# Patient Record
Sex: Male | Born: 1950 | Race: White | Hispanic: No | Marital: Married | Smoking: Former smoker
Health system: Southern US, Community
[De-identification: ages and names within clinical notes are randomized; demographics above are authoritative.]

## PROBLEM LIST (undated history)

## (undated) DIAGNOSIS — R52 Pain, unspecified: Secondary | ICD-10-CM

## (undated) DIAGNOSIS — H02402 Unspecified ptosis of left eyelid: Secondary | ICD-10-CM

## (undated) DIAGNOSIS — G629 Polyneuropathy, unspecified: Secondary | ICD-10-CM

## (undated) DIAGNOSIS — E785 Hyperlipidemia, unspecified: Secondary | ICD-10-CM

## (undated) DIAGNOSIS — R42 Dizziness and giddiness: Secondary | ICD-10-CM

## (undated) DIAGNOSIS — H9192 Unspecified hearing loss, left ear: Secondary | ICD-10-CM

## (undated) DIAGNOSIS — Z923 Personal history of irradiation: Secondary | ICD-10-CM

## (undated) DIAGNOSIS — C109 Malignant neoplasm of oropharynx, unspecified: Secondary | ICD-10-CM

## (undated) DIAGNOSIS — Z860100 Personal history of colon polyps, unspecified: Secondary | ICD-10-CM

## (undated) DIAGNOSIS — K1230 Oral mucositis (ulcerative), unspecified: Secondary | ICD-10-CM

## (undated) DIAGNOSIS — G4733 Obstructive sleep apnea (adult) (pediatric): Secondary | ICD-10-CM

## (undated) DIAGNOSIS — Z9221 Personal history of antineoplastic chemotherapy: Secondary | ICD-10-CM

## (undated) DIAGNOSIS — E7800 Pure hypercholesterolemia, unspecified: Secondary | ICD-10-CM

## (undated) DIAGNOSIS — K117 Disturbances of salivary secretion: Secondary | ICD-10-CM

## (undated) DIAGNOSIS — Z9289 Personal history of other medical treatment: Secondary | ICD-10-CM

## (undated) DIAGNOSIS — K219 Gastro-esophageal reflux disease without esophagitis: Secondary | ICD-10-CM

## (undated) DIAGNOSIS — M5126 Other intervertebral disc displacement, lumbar region: Secondary | ICD-10-CM

## (undated) DIAGNOSIS — K4090 Unilateral inguinal hernia, without obstruction or gangrene, not specified as recurrent: Secondary | ICD-10-CM

## (undated) DIAGNOSIS — R682 Dry mouth, unspecified: Secondary | ICD-10-CM

## (undated) DIAGNOSIS — Z931 Gastrostomy status: Secondary | ICD-10-CM

## (undated) DIAGNOSIS — S82409A Unspecified fracture of shaft of unspecified fibula, initial encounter for closed fracture: Secondary | ICD-10-CM

## (undated) DIAGNOSIS — N400 Enlarged prostate without lower urinary tract symptoms: Secondary | ICD-10-CM

## (undated) DIAGNOSIS — G709 Myoneural disorder, unspecified: Secondary | ICD-10-CM

## (undated) HISTORY — DX: Dizziness and giddiness: R42

## (undated) HISTORY — DX: Oral mucositis (ulcerative), unspecified: K12.30

## (undated) HISTORY — DX: Malignant neoplasm of oropharynx, unspecified: C10.9

## (undated) HISTORY — DX: Disturbances of salivary secretion: K11.7

## (undated) HISTORY — DX: Unspecified ptosis of left eyelid: H02.402

## (undated) HISTORY — DX: Gastro-esophageal reflux disease without esophagitis: K21.9

## (undated) HISTORY — DX: Polyneuropathy, unspecified: G62.9

## (undated) HISTORY — DX: Hyperlipidemia, unspecified: E78.5

## (undated) HISTORY — DX: Pain, unspecified: R52

## (undated) HISTORY — DX: Pure hypercholesterolemia, unspecified: E78.00

## (undated) HISTORY — PX: DIAGNOSTIC LAPAROSCOPY: SUR761

## (undated) HISTORY — DX: Benign prostatic hyperplasia without lower urinary tract symptoms: N40.0

## (undated) HISTORY — DX: Unspecified hearing loss, left ear: H91.92

## (undated) HISTORY — DX: Obstructive sleep apnea (adult) (pediatric): G47.33

## (undated) HISTORY — DX: Dry mouth, unspecified: R68.2

## (undated) HISTORY — DX: Unilateral inguinal hernia, without obstruction or gangrene, not specified as recurrent: K40.90

---

## 1988-06-14 HISTORY — PX: HERNIA REPAIR: SHX51

## 2003-06-16 ENCOUNTER — Emergency Department (HOSPITAL_COMMUNITY): Admission: AD | Admit: 2003-06-16 | Discharge: 2003-06-16 | Payer: Self-pay | Admitting: Family Medicine

## 2004-05-05 ENCOUNTER — Encounter: Admission: RE | Admit: 2004-05-05 | Discharge: 2004-05-25 | Payer: Self-pay | Admitting: Family Medicine

## 2004-06-25 ENCOUNTER — Encounter: Admission: RE | Admit: 2004-06-25 | Discharge: 2004-06-25 | Payer: Self-pay | Admitting: Family Medicine

## 2007-12-11 ENCOUNTER — Ambulatory Visit: Payer: Self-pay | Admitting: Internal Medicine

## 2007-12-11 DIAGNOSIS — R079 Chest pain, unspecified: Secondary | ICD-10-CM | POA: Insufficient documentation

## 2007-12-11 DIAGNOSIS — J309 Allergic rhinitis, unspecified: Secondary | ICD-10-CM | POA: Insufficient documentation

## 2007-12-11 DIAGNOSIS — E785 Hyperlipidemia, unspecified: Secondary | ICD-10-CM

## 2007-12-11 DIAGNOSIS — E7800 Pure hypercholesterolemia, unspecified: Secondary | ICD-10-CM | POA: Insufficient documentation

## 2007-12-11 DIAGNOSIS — R0989 Other specified symptoms and signs involving the circulatory and respiratory systems: Secondary | ICD-10-CM

## 2007-12-11 DIAGNOSIS — R0609 Other forms of dyspnea: Secondary | ICD-10-CM | POA: Insufficient documentation

## 2008-06-05 ENCOUNTER — Ambulatory Visit (HOSPITAL_BASED_OUTPATIENT_CLINIC_OR_DEPARTMENT_OTHER): Admission: RE | Admit: 2008-06-05 | Discharge: 2008-06-05 | Payer: Self-pay | Admitting: Orthopedic Surgery

## 2008-06-08 ENCOUNTER — Ambulatory Visit: Payer: Self-pay | Admitting: Internal Medicine

## 2008-06-11 ENCOUNTER — Encounter: Payer: Self-pay | Admitting: Cardiovascular Disease

## 2008-06-24 ENCOUNTER — Encounter: Payer: Self-pay | Admitting: Cardiovascular Disease

## 2010-10-30 NOTE — Procedures (Signed)
NAME:  Steven Robinson, Steven Robinson                ACCOUNT NO.:  [id number]   MEDICAL RECORD NO.:  [id number]          PATIENT TYPE:  OUT   LOCATION:  SLEEP CENTER                 FACILITY:  [facility]   PHYSICIAN:  Clinton D. Maple Hudson, MD, FCCP, FACPDATE OF BIRTH:  1952/05/12   DATE OF STUDY:                            NOCTURNAL POLYSOMNOGRAM   REFERRING PHYSICIAN:  Tammy R. Collins Scotland, M.D.   INDICATION FOR STUDY:  Hypersomnia with sleep apnea.   EPWORTH SLEEPINESS SCORE:  Epworth sleepiness score 1/24.  BMI 35.6.  Weight 270 pounds.  Height 73 inches.  Neck 18 inches.   MEDICATIONS:  Home medications are charted and reviewed.   SLEEP ARCHITECTURE:  Split-study protocol.  During the diagnostic phase,  total sleep time was 159 minutes with sleep efficiency 82%.  Stage I was  5.3%.  Stage II 72.1%.  Stage III absent.  REM 22.6% of total sleep  time.  Sleep latency 8.5 minutes.  REM latency 77.5 minutes.  Wake after  sleep onset, 26 minutes. Arousal index 47 indicating increased EEG  arousal.  Bedtime medication included Ambien, trazodone, aspirin, Xyzal,  and Singulair.   RESPIRATORY DATA:  Split-study protocol.  Apnea-hypopnea index (AHI)  31.2 per hour.  A total of 83 events were scored including 12  obstructive apneas and 71 hypopneas.  All events were recorded as non-  supine.  REM AHI 56.7.  CPAP was titrated to 11 CWP, AHI 0.5 per hour.  He chose a medium ResMed Mirage Quattro full face mask with heated  humidifier.   OXYGEN DATA:  Very loud snoring before CPAP with oxygen desaturation to  a nadir of 85%.  After CPAP control, oxygen saturation mean 94.5% on  room air.   CARDIAC DATA:  Sinus rhythm with frequent PACs.   MOVEMENT-PARASOMNIA:  Occasional limb jerks were noted with a total of  53 counted, of which one was associated with arousal or awakening,  insignificant.  Bathroom x1.   IMPRESSION/RECOMMENDATION:  1. Moderate obstructive sleep apnea/hypopnea syndrome, AHI 31.2 per      hour  with all events listed as non-supine.  Very loud snoring with      oxygen desaturation to a nadir of 85% before CPAP.  2. Successful CPAP titration to 11 CWP, AHI 0.5 per hour.  He chose a      medium ResMed Quattro full face mask      with heated humidifier.  3. Note that the patient avoids supine position because of back      spasms.      Clinton D. Maple Hudson, MD, [facility], FACP  Diplomate, Biomedical engineer of Sleep Medicine  Electronically Signed     CDY/MEDQ  D:  06/09/2008 10:54:50  T:  06/10/2008 01:51:58  Job:  [id number]

## 2011-08-24 ENCOUNTER — Other Ambulatory Visit (HOSPITAL_COMMUNITY)
Admission: RE | Admit: 2011-08-24 | Discharge: 2011-08-24 | Disposition: A | Discharge status: Home / Self Care | Payer: Managed Care, Other (non HMO) | Source: Ambulatory Visit | Attending: Otolaryngology | Admitting: Otolaryngology

## 2011-08-24 DIAGNOSIS — R220 Localized swelling, mass and lump, head: Secondary | ICD-10-CM | POA: Insufficient documentation

## 2011-08-24 DIAGNOSIS — R221 Localized swelling, mass and lump, neck: Secondary | ICD-10-CM | POA: Insufficient documentation

## 2011-08-26 DIAGNOSIS — C109 Malignant neoplasm of oropharynx, unspecified: Secondary | ICD-10-CM

## 2011-08-26 HISTORY — DX: Malignant neoplasm of oropharynx, unspecified: C10.9

## 2011-08-26 HISTORY — PX: FINE NEEDLE ASPIRATION: SHX406

## 2011-08-30 ENCOUNTER — Other Ambulatory Visit: Payer: Self-pay | Admitting: Otolaryngology

## 2011-08-30 DIAGNOSIS — IMO0002 Reserved for concepts with insufficient information to code with codable children: Secondary | ICD-10-CM

## 2011-08-30 DIAGNOSIS — C799 Secondary malignant neoplasm of unspecified site: Secondary | ICD-10-CM

## 2011-08-31 ENCOUNTER — Telehealth: Payer: Self-pay | Admitting: Hematology & Oncology

## 2011-08-31 ENCOUNTER — Encounter: Payer: Self-pay | Admitting: *Deleted

## 2011-08-31 NOTE — Telephone Encounter (Signed)
Per MD Patient cannot wait till 09-08-11 for PET scan. I talked to [place] at [facility] and he said there was no way they could do it until next week. Pet is scheduled at [facility] for 09-03-11 at 8am Patient is aware of appointment and to be NPO 6 hrs and to get a CD of the scan to bring with him here.. Scheduled with Joyce Gross at [facility]. 3-27 at [facility] is canceled order was faxed.

## 2011-09-01 ENCOUNTER — Other Ambulatory Visit: Payer: Self-pay | Admitting: Radiation Oncology

## 2011-09-01 ENCOUNTER — Encounter: Payer: Self-pay | Admitting: *Deleted

## 2011-09-01 ENCOUNTER — Ambulatory Visit
Admission: RE | Admit: 2011-09-01 | Discharge: 2011-09-01 | Disposition: A | Discharge status: Home / Self Care | Payer: Managed Care, Other (non HMO) | Source: Ambulatory Visit | Attending: Radiation Oncology | Admitting: Radiation Oncology

## 2011-09-01 ENCOUNTER — Encounter: Payer: Self-pay | Admitting: Radiation Oncology

## 2011-09-01 DIAGNOSIS — G4733 Obstructive sleep apnea (adult) (pediatric): Secondary | ICD-10-CM | POA: Insufficient documentation

## 2011-09-01 DIAGNOSIS — Z79899 Other long term (current) drug therapy: Secondary | ICD-10-CM | POA: Insufficient documentation

## 2011-09-01 DIAGNOSIS — Z510 Encounter for antineoplastic radiation therapy: Secondary | ICD-10-CM | POA: Insufficient documentation

## 2011-09-01 DIAGNOSIS — Z7982 Long term (current) use of aspirin: Secondary | ICD-10-CM | POA: Insufficient documentation

## 2011-09-01 DIAGNOSIS — C760 Malignant neoplasm of head, face and neck: Secondary | ICD-10-CM | POA: Insufficient documentation

## 2011-09-01 DIAGNOSIS — K9422 Gastrostomy infection: Secondary | ICD-10-CM | POA: Insufficient documentation

## 2011-09-01 DIAGNOSIS — J029 Acute pharyngitis, unspecified: Secondary | ICD-10-CM | POA: Insufficient documentation

## 2011-09-01 DIAGNOSIS — Z809 Family history of malignant neoplasm, unspecified: Secondary | ICD-10-CM | POA: Insufficient documentation

## 2011-09-01 DIAGNOSIS — B370 Candidal stomatitis: Secondary | ICD-10-CM | POA: Insufficient documentation

## 2011-09-01 DIAGNOSIS — Y833 Surgical operation with formation of external stoma as the cause of abnormal reaction of the patient, or of later complication, without mention of misadventure at the time of the procedure: Secondary | ICD-10-CM | POA: Insufficient documentation

## 2011-09-01 DIAGNOSIS — C770 Secondary and unspecified malignant neoplasm of lymph nodes of head, face and neck: Secondary | ICD-10-CM | POA: Insufficient documentation

## 2011-09-01 DIAGNOSIS — C801 Malignant (primary) neoplasm, unspecified: Secondary | ICD-10-CM | POA: Insufficient documentation

## 2011-09-01 DIAGNOSIS — L988 Other specified disorders of the skin and subcutaneous tissue: Secondary | ICD-10-CM | POA: Insufficient documentation

## 2011-09-01 DIAGNOSIS — K121 Other forms of stomatitis: Secondary | ICD-10-CM | POA: Insufficient documentation

## 2011-09-01 DIAGNOSIS — K219 Gastro-esophageal reflux disease without esophagitis: Secondary | ICD-10-CM | POA: Insufficient documentation

## 2011-09-01 HISTORY — DX: Personal history of colonic polyps: Z86.010

## 2011-09-01 HISTORY — DX: Other intervertebral disc displacement, lumbar region: M51.26

## 2011-09-01 HISTORY — DX: Personal history of colon polyps, unspecified: Z86.0100

## 2011-09-01 NOTE — Progress Notes (Signed)
Encounter addended by: Delynn Flavin, RN on: 09/01/2011  6:01 PM<BR>     Documentation filed: Inpatient Patient Education

## 2011-09-01 NOTE — Progress Notes (Signed)
[facility] Radiation Oncology NEW PATIENT EVALUATION  Name: Steven Robinson MRN: [id number]  Date: 09/01/2011  DOB: [birth year]  Status: outpatient   CC: No primary provider on file.  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 male who was in his usual state of health until he developed some cold-like symptoms and a subsequent mass in his left neck. The mass was noticeable approximately 3 weeks ago. He estimates that he noticed it on 08/14/2011. Mass has gotten progressively bigger. He has also been more tired than usual. He acknowledges a dry cough and some postnasal drip. He believes his voice is a little more raspy than usual but may attribute this to a cold and allergies. He saw his primary doctor to referred him to otolaryngology. The patients underwent a CT scan of his neck on 08/20/2011. This showed enlarged left mid jugular chain lymph nodes. I have the report from that CT scan but not the actual images. These lymph nodes were grouped together in a large mass. The nodal mass was biopsied on 08/24/2011, showing metastatic squamous cell carcinoma. HPV P16 immunohistochemical stain is equivocal..  He has a PET scan scheduled for Friday, March 22. This is going to be performed at [facility]. The patient was referred to medical oncology in [place] [facility] but he wishes to see a medical oncologist in [place] as this is actually closer to his home.  In terms of other symptoms he had knowledge is a little bit of soreness in his left collarbone as well as some dizziness for the past 3 months and occasional right-sided headaches in the evenings. He denies any dysphagia odynophagia or significant smoking history. He had his eyes checked by the eye doctor recently and his vision is decreased from his previous appointment. However he  denies any focal neurologic deficits. His left ear has been popping during recent airplane trips.  He has no  history of skin cancer.  PREVIOUS RADIATION THERAPY: No   PAST MEDICAL HISTORY:  has a past medical history of Hyperlipoproteinemia; Obstructive sleep apnea; Mass in neck; Cancer (08/26/2011); GERD (gastroesophageal reflux disease); colonic polyps; and Herniated lumbar intervertebral disc.     PAST SURGICAL HISTORY:  Past Surgical History  Procedure Date  . Hernia repair     Left  . Fine needle aspiration 08/26/11     LEFT NECK FNA, LYMPH NODE, METASTATIC SQUAMOUS CELL CARCINOMA     FAMILY HISTORY: family history includes Alzheimer's disease in his mother; Cancer in his paternal grandfather; Cancer (age of onset:75) in his father; Hypertension in his father; Stroke in his paternal grandmother; and Tics in his mother.   SOCIAL HISTORY:  reports that he has never smoked. He does not have any smokeless tobacco history on file. He reports that he does not drink alcohol or use illicit drugs. He acknowledges that socially he smoked tobacco in college but none in the past 30-35 years.  He used to drink 3-4 glasses of wine a day but he quit drinking 3 years ago.   ALLERGIES: Cyclobenzaprine hcl   MEDICATIONS:  Current Outpatient Prescriptions  Medication Sig Dispense Refill  . aspirin 81 MG tablet Take 81 mg by mouth daily.      . diphenhydrAMINE (BENADRYL) 25 MG tablet Take 25 mg by mouth Nightly.       [name]  omega-3 acid ethyl esters (LOVAZA) 1 G capsule Take 4 g by mouth Nightly.       . rosuvastatin (CRESTOR) 10 MG tablet Take 40 mg by mouth daily.       . Tamsulosin HCl (FLOMAX) 0.4 MG CAPS Take by mouth.      . zolpidem (AMBIEN) 10 MG tablet Take 10 mg by mouth at bedtime as needed.         REVIEW OF SYSTEMS:  A comprehensive review of systems was negative. other than that described above    PHYSICAL EXAM:  weight is 280 lb 6.4 oz (127.189 kg). His temperature is 97 F  (36.1 C). His blood pressure is 127/84 and his pulse is 81.   General: Alert and oriented, in no acute distress, well-appearing HEENT: Head is normocephalic. Teeth are in decent condition. Pupils are equally round and reactive to light. Extraocular movements are intact. Oropharynx is clear to visual inspection and palpation. Neck: Neck is supple, and notable for a palpable lymph node mass between levels 2 and 3 of the left neck. It is approximately 3.5 centimeters in dimension. The rest of the cervical neck is negative as well as the supraclavicular regions bilaterally. Heart: Regular in rate and rhythm with no murmurs, rubs, or gallops. Chest: Clear to auscultation bilaterally, with no rhonchi, wheezes, or rales. Abdomen: Soft, nontender, nondistended, with no rigidity or guarding. Extremities: No cyanosis or edema. Lymphatics: No concerning lymphadenopathy. Skin: No concerning lesions. Musculoskeletal: symmetric strength and muscle tone throughout. Neurologic: Cranial nerves II through XII are grossly intact. No obvious focalities. Speech is fluent. Coordination is intact. Psychiatric: Judgment and insight are intact. Affect is appropriate.     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 I recommend the following  #1 I concur with the PET scan has been ordered for this Friday. This may help elucidate his primary   #2 If the PET scan is negative, I understand that the patient would be seen back in otolaryngology for endoscopy with  biopsies of the patient's mucosal axis   #3 As the patient would like to see a medical  oncologist in [place] I will refer him to Dr. Gaylyn Rong. I am not sure if Dr. Gaylyn Rong will recommend chemotherapy, but if he does, the patient will potentially need referral for PEG tube  #4 I am referring the patient to dentistry at the cancer center for a preradiation evaluation.   #5 After his workup is complete I would like to start planning his radiotherapy without delay. I told the patient he should expect about 7 weeks of radiotherapy. I spoke with the patient and his wife in great depth about the risks benefits and side effects of radiotherapy to the head and neck region. I will likely treat the nasopharynx and oropharynx and the bilateral neck if he continues to have squamous cell cancer of unknown primary.  #6 in anticipation of dysphasia in the future I will refer the patient to speech and swallow therapy.  It was a pleasure meeting the patient today. We discussed the risks, benefits, and side effects of radiotherapy. No  guarantees of treatment were given. A consent form was signed and placed in the patient's medical record. The patient is enthusiastic about proceeding with treatment. I look forward to participating in the patient's care.  I spent 60 minutes minutes face to face with the patient and more than 50% of that time was spent in counseling and/or coordination of care.

## 2011-09-01 NOTE — Progress Notes (Signed)
Please see the Nurse Progress Note in the MD Initial Consult Encounter for this patient. 

## 2011-09-02 ENCOUNTER — Telehealth: Payer: Self-pay | Admitting: *Deleted

## 2011-09-02 ENCOUNTER — Telehealth: Payer: Self-pay | Admitting: Hematology & Oncology

## 2011-09-02 ENCOUNTER — Encounter: Payer: Self-pay | Admitting: Oncology

## 2011-09-02 NOTE — Telephone Encounter (Signed)
Received new pt chart to schedule appt w/[facility]. Pt already scheduled w/PE in [facility] for 3/25. Chart returned to [facility].

## 2011-09-02 NOTE — Telephone Encounter (Signed)
Patient is calling [facility] to confirm 3-22 PET and he is aware of 09-06-11 MD appointment

## 2011-09-02 NOTE — Telephone Encounter (Signed)
xxxx 

## 2011-09-03 ENCOUNTER — Other Ambulatory Visit: Payer: Self-pay | Admitting: *Deleted

## 2011-09-03 ENCOUNTER — Telehealth: Payer: Self-pay | Admitting: Oncology

## 2011-09-03 ENCOUNTER — Telehealth: Payer: Self-pay | Admitting: *Deleted

## 2011-09-03 ENCOUNTER — Telehealth: Payer: Self-pay | Admitting: Hematology & Oncology

## 2011-09-03 NOTE — Telephone Encounter (Signed)
Rec'd referral on patient on 09/01/11 from Dr Karoline Caldwell to Dr Gaylyn Rong, however, patient had already been referred to [facility] by his primary MD earlier in the week. Appt was set with Dr Myna Hidalgo, however once patient talked to Dr Karoline Caldwell, he decided he would like to do all his treatments at the [facility] location since he lives in [place]. I received a call from patient at [phone] on 3/21 to make sure we knew of his decision. Patient thought he was going to see Dr Myna Hidalgo and Dr Gaylyn Rong, informed patient that he only needs to see one unless he was looking for a second opinion.  Informed patient that we will inform Dr Tama Gander office of his preference, since PET is set up at [facility], he needs to request a CD when he has this done on friday for future comparisons that may be done here at [facility]. Patient verbalized understanding in plan and will notify Raiford Noble in [facility] of his decision. POF to scheduling for Monday 09/06/11.

## 2011-09-03 NOTE — Telephone Encounter (Signed)
After appointment with radiation patient has decided to see Dr. Gaylyn Rong at [facility]. Pt states he has 3-25 1030 am appointment. Sent Amy e-mail and canceled his 3-25 appointment with Dr. Myna Hidalgo.

## 2011-09-03 NOTE — Telephone Encounter (Signed)
Called pt and informed him of appt for 03/25.  will fax  a letter to Dr. Basilio Cairo with appt d/t

## 2011-09-03 NOTE — Telephone Encounter (Signed)
Pt called to confirm appt for Monday w/ Dr. Gaylyn Rong.Steven Robinson he was told to arrive at 10:30 am but now he just talked to someone else here and they told him his appt is not in the computer.  Informed pt that he does have appt w/ Dr. Gaylyn Rong on Monday (per Laroy Apple, new pt coordinator), it just hasn't been entered into computer yet. He verbalized understanding and will be here at 10:30 am on Monday.

## 2011-09-03 NOTE — Progress Notes (Signed)
Encounter addended by: Delynn Flavin, RN on: 09/03/2011  5:14 PM<BR>     Documentation filed: Charges VN

## 2011-09-05 ENCOUNTER — Encounter: Payer: Self-pay | Admitting: Oncology

## 2011-09-06 ENCOUNTER — Other Ambulatory Visit: Payer: Managed Care, Other (non HMO) | Admitting: Lab

## 2011-09-06 ENCOUNTER — Ambulatory Visit: Payer: Managed Care, Other (non HMO)

## 2011-09-06 ENCOUNTER — Ambulatory Visit: Payer: Managed Care, Other (non HMO) | Admitting: Hematology & Oncology

## 2011-09-06 ENCOUNTER — Other Ambulatory Visit (HOSPITAL_BASED_OUTPATIENT_CLINIC_OR_DEPARTMENT_OTHER): Payer: Managed Care, Other (non HMO)

## 2011-09-06 ENCOUNTER — Other Ambulatory Visit: Payer: Self-pay | Admitting: *Deleted

## 2011-09-06 ENCOUNTER — Ambulatory Visit (HOSPITAL_BASED_OUTPATIENT_CLINIC_OR_DEPARTMENT_OTHER): Payer: Managed Care, Other (non HMO) | Admitting: Oncology

## 2011-09-06 ENCOUNTER — Telehealth: Payer: Self-pay | Admitting: Oncology

## 2011-09-06 ENCOUNTER — Encounter: Payer: Self-pay | Admitting: Oncology

## 2011-09-06 ENCOUNTER — Encounter: Payer: Self-pay | Admitting: *Deleted

## 2011-09-06 ENCOUNTER — Ambulatory Visit: Payer: Managed Care, Other (non HMO) | Attending: Radiation Oncology

## 2011-09-06 DIAGNOSIS — R1313 Dysphagia, pharyngeal phase: Secondary | ICD-10-CM | POA: Insufficient documentation

## 2011-09-06 DIAGNOSIS — IMO0001 Reserved for inherently not codable concepts without codable children: Secondary | ICD-10-CM | POA: Insufficient documentation

## 2011-09-06 DIAGNOSIS — C760 Malignant neoplasm of head, face and neck: Secondary | ICD-10-CM

## 2011-09-06 DIAGNOSIS — N400 Enlarged prostate without lower urinary tract symptoms: Secondary | ICD-10-CM

## 2011-09-06 DIAGNOSIS — G4733 Obstructive sleep apnea (adult) (pediatric): Secondary | ICD-10-CM

## 2011-09-06 DIAGNOSIS — C109 Malignant neoplasm of oropharynx, unspecified: Secondary | ICD-10-CM

## 2011-09-06 LAB — CBC WITH DIFFERENTIAL/PLATELET
Basophils Absolute: 0 10*3/uL (ref 0.0–0.1)
EOS%: 3.2 % (ref 0.0–7.0)
Eosinophils Absolute: 0.2 10*3/uL (ref 0.0–0.5)
HCT: 42.1 % (ref 38.4–49.9)
HGB: 14.3 g/dL (ref 13.0–17.1)
MCH: 30.1 pg (ref 27.2–33.4)
MCV: 88.7 fL (ref 79.3–98.0)
NEUT%: 53.2 % (ref 39.0–75.0)
lymph#: 1.7 10*3/uL (ref 0.9–3.3)

## 2011-09-06 LAB — COMPREHENSIVE METABOLIC PANEL
AST: 25 U/L (ref 0–37)
BUN: 15 mg/dL (ref 6–23)
Calcium: 9.1 mg/dL (ref 8.4–10.5)
Chloride: 106 mEq/L (ref 96–112)
Creatinine, Ser: 0.81 mg/dL (ref 0.50–1.35)
Glucose, Bld: 96 mg/dL (ref 70–99)

## 2011-09-06 MED ORDER — LORAZEPAM 0.5 MG PO TABS: 0.5000 mg | ORAL_TABLET | Freq: Four times a day (QID) | ORAL | Status: DC | PRN

## 2011-09-06 MED ORDER — ONDANSETRON HCL 8 MG PO TABS: ORAL_TABLET | ORAL | Status: DC

## 2011-09-06 MED ORDER — PROCHLORPERAZINE MALEATE 10 MG PO TABS: 10.0000 mg | ORAL_TABLET | Freq: Four times a day (QID) | ORAL | Status: DC | PRN

## 2011-09-06 NOTE — Progress Notes (Signed)
[facility] MEDICAL ONCOLOGY - INITIAL CONSULATION  Referral MD:  Steven Robinson.   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    HPI: Steven Robinson is a 61 year-old man with history of smoking which he quit after graduating from college.  He used to drink EtOH (a few glasses of wine daily) 3 years ago.  He has been in usual state of health until early March 2013 when he noticed progressive growth of left cervical neck node.  He presented to his PCP.  A CT neck was performed on 08/20/2011 which showed grouping of enlarged left mid jugular [place] nodes.  He was referred to Steven Robinson who performed in office FNA on 08/24/2011 with pathology case # [id number].  It was positive for metastatic squamous cell carcinoma; p16 equivocal.  He has seen Steven Robinson who recommended concurrent chemoradiation.  He was kindly referred to me for evaluation for role of chemotherapy.  Steven Robinson is here with his wife.  He thinks that there is still growth of the left cervical neck mass.  He has had fatigue the last few months.  He still works full time as a Holiday representative; however, when he gets home, he feels exhausted.  He does feel left ear fullness in addition to baseline loss of hearing in the left ears.  He has good appetite without weight loss.  He has intermittent headache which does not require chronic pain med.   Patient denies fatigue, headache, visual changes, confusion, drenching night sweats, palpable lymph node swelling, mucositis, odynophagia, dysphagia, nausea vomiting, jaundice, chest pain, palpitation, shortness of breath, dyspnea on exertion, productive cough, gum bleeding, epistaxis, hematemesis, hemoptysis, abdominal pain, abdominal swelling, early satiety, melena, hematochezia, hematuria, skin rash, spontaneous bleeding, joint swelling, joint pain, heat or cold intolerance, bowel bladder incontinence, back pain, focal motor weakness, paresthesia,  depression, suicidal or homocidal ideation, feeling hopelessness.     Past Medical History  Diagnosis Date  . Hyperlipoproteinemia   . Obstructive sleep apnea     on C-pap.   . 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 GERD (gastroesophageal reflux disease)     chronic  . Hx of colonic polyps   . 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 Vertigo     2/2 intersitial neuritis.   [name] Hearing loss of left ear   . BPH (benign prostatic hyperplasia)   :  Past Surgical History  Procedure Date  . Hernia repair 1990    Left  . Fine needle aspiration 08/26/11     LEFT NECK FNA, LYMPH NODE, METASTATIC SQUAMOUS CELL CARCINOMA  :  Current Outpatient Prescriptions  Medication Sig Dispense Refill  . ALPRAZolam (XANAX) 0.5 MG tablet Take by mouth as needed.       [name] aspirin 81 MG tablet Take 81 mg by mouth daily.      . diphenhydrAMINE (BENADRYL) 25 MG tablet Take 25 mg by mouth at bedtime as needed.       [name] DYMISTA 137-50 MCG/ACT SUSP Place into the nose as needed.       [name] omega-3 acid ethyl esters (LOVAZA) 1 G capsule Take 4 g by mouth Nightly.       . rosuvastatin (CRESTOR) 10 MG tablet Take 40 mg by mouth daily.       . Tamsulosin HCl (FLOMAX) 0.4 MG CAPS Take by mouth.      . zolpidem (AMBIEN) 10 MG tablet Take  10 mg by mouth at bedtime as needed.      [name] HYDROcodone-acetaminophen (NORCO) 5-325 MG per tablet Take 1 tablet by mouth every 6 (six) hours as needed.       [name] LORazepam (ATIVAN) 0.5 MG tablet Take 1 tablet (0.5 mg total) by mouth every 6 (six) hours as needed (Nausea or vomiting).  30 tablet  0  . ondansetron (ZOFRAN) 8 MG tablet Take 1 tablet two times a day as needed for nausea or vomiting starting on the third day after chemotherapy.  30 tablet  1  . prochlorperazine (COMPAZINE) 10 MG tablet Take 1 tablet (10 mg total) by mouth every 6 (six) hours as needed (Nausea or vomiting).  30 tablet  1     Allergies  Allergen Reactions  . Cyclobenzaprine Hcl     confusion   :  Family History  Problem Relation Age of Onset  . Alzheimer's disease Steven Robinson   . Tics Steven Robinson   . Hypertension Steven Robinson   . Cancer Steven Robinson 25    Prostate  . Stroke Steven Robinson   . Cancer Steven Robinson     Lung  and Prostate  :  History   Social History  . Marital Status: Married    Spouse Name: N/A    Number of Children: 2  . Years of Education: N/A   Occupational History  .      Pharmacy Rep Manager   Social History Main Topics  . Smoking status: Former Smoker -- 0.2 packs/day for 3 years    Quit date: 06/14/1972  . Smokeless tobacco: Not on file  . Alcohol Use: No     Hx of Drinking wine - Stopped 3 years ago  . Drug Use: No  . Sexually Active: Yes    Birth Control/ Protection: None   Other Topics Concern  . Not on file   Social History Narrative  . No narrative on file    Pertinent items are noted in HPI.  Exam: ECOG 0.   General:  well-nourished in no acute distress.  Eyes:  no scleral icterus.  ENT:  There were no oropharyngeal lesions.  Neck was without thyromegaly.  Lymphatics:  Positive for multiple nodes matted together in the left level 2-3 nodes.  Negative for supraclavicular or axillary adenopathy.  Respiratory: lungs were clear bilaterally without wheezing or crackles.  Cardiovascular:  Regular rate and rhythm, S1/S2, without murmur, rub or gallop.  There was no pedal edema.  GI:  abdomen was soft, flat, nontender, nondistended, without organomegaly.  Muscoloskeletal:  no spinal tenderness of palpation of vertebral spine.  Skin exam was without echymosis, petichae.  Neuro exam was nonfocal.  Patient was able to get on and off exam table without assistance.  Gait was normal.  Patient was alerted and oriented.  Attention was good.   Language was appropriate.  Mood was normal without depression.  Speech was not pressured.  Thought content was not tangential.     Lab Results  Component Value Date   WBC 5.1 09/06/2011   HGB 14.3 09/06/2011    HCT 42.1 09/06/2011   PLT 153 09/06/2011   GLUCOSE 96 09/06/2011   ALT 37 09/06/2011   AST 25 09/06/2011   NA 138 09/06/2011   K 4.1 09/06/2011   CL 106 09/06/2011   CREATININE 0.81 09/06/2011   BUN 15 09/06/2011   CO2 26 09/06/2011   IMAGING:  PET 09/03/2011 with [facility]  I personally reviewed the PET images  and showed the pictures to the patient and his wife.  There was uptake in the left oropharynx and larynx in addition to multiple nodes in the left cervical neck.  There was no evidence of distant metastatic disease.      ASSESSMENT AND PLAN:  1.  History of smoking:  Quit long time along in college. 2.  History of drinking a few glasses of wine a day:  He also quit this 3 years ago. 3.  Obstructive sleep apnea:  He is on C-pap.  4.  BPH:  Symptoms well controlled with Flomax. 5.  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  STAGING:  Most likely oropharynx.  However, there was uptake in larynx which is most likely physiologic.  I wonder if Steven Robinson thinks that it's appropriate to perform direct laryngoscopy in the OR to ensure that the primary is either right base of the tongue or tonsillar fossae and not in larynx.  This will also allow restaining for HPV for both prognostic and treatment guidance.   PROGNOSIS:  If he is indeed HPV positive, he has better prognosis than HPV negative with 5 year average overall survival of 75% vs 50%.    TREATMENT OPTIONS:  Given cN2b disease, if he were to proceed with upfront resection, he will most likely need adjuvant chemoradiation.  Thus, I recommended upfront concurrent chemoradiation with has chance of local response upward to 70% and reserve resection for salvage if there is residual disease.   There are 3 options for chemotherapy:  q3wk cisplatin vs qweek Carboplatin/Taxol vs qweek Erbitux.  I discussed with patient side effects of cisplatin which include but not limited to nausea vomiting, alopecia, fatigue,  mucositis, ototoxicity, nephrotoxicity, abnormal electrolytes, cytopenia, risk of bleeding and infection. I discussed the side effects of combination regimen of Carboplatin andTaxol which include but not limited to mucositis, cytopenia, infection, infusion reaction, neuropathy, myalgia, arthralgia, pneumonitis. I discussed with patient side effects of Erbitux which include but not limited to skin rash, electrolytes abnormality, infusion reaction, diarrhea.  I discussed the potential role of induction chemo with cisplatin/5FU/Taxotere prior to concurrent chemoradiation.  There is a potential benefit of decreasing the risk of infection; however, the induction regimen may be quite toxic.  It has not been proven to increase overall survival compared to concurrent chemoradiation without induction chemo.  Since she does not have very bulky disease or impending organ failure, I will defer the use of induction chemo.   I brought up clinical trial RTOG 1016 for patients with oropharynx SCC with HPV positive.  If he undergoes direct laryngoscopy and biopsy proven unequivocally that he has HPV positive, he would be a good candidate for this trial.  He agreed to speak with our research department about this trial.   Mr. Dyas and his wife expressed informed understanding and wished to proceed with stated recommendations of concurrent chemoradiation.  At this time, he needs time to decide; but is currently leaning toward q3week Cisplatin.  I preparation for chemo, he was arranged for baseline audiogram.  I gave him scripts for Compazine/Zofran/Ativan.  He has appointment with Dr. Kristin Bruins tomorrow.  He was referred to speech pathology and prophylactic PEG tube placement.  He was referred to chemo class.   6.  Follow up:  I will see him in about 2 weeks to go over any last minute questions.   FULL CODE.   The length of time of the face-to-face encounter was 60 minutes. More than  50% of time was spent counseling and  coordination of care.

## 2011-09-06 NOTE — Telephone Encounter (Signed)
gv pt appt for april2013.  called IR s/w Victorino Dike and she stated that she will call pt with appt d/t for peg tube placement

## 2011-09-06 NOTE — Progress Notes (Signed)
Copy of PET scan report from [facility] faxed to Dr. Jearld Fenton office at fax [phone],  Per Dr. Lodema Pilot request.

## 2011-09-06 NOTE — Patient Instructions (Signed)
1.  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  2.  Stage:  IVA (sitll curable) due to present of multiple neck nodes.  3.  Recommended treatment:  Concurrent chemoradiation.  Standard of care chemo is Cisplatin.   There is also a clinical trial randomizing patients with HPV+ oropharynx cancer to radiation and either cisplatin or Erbitux.   Average chance of cure is about 70% (no need for surgery).  3-4 months post radiation, we will repeat PET scan again to assess response.  4.  What to do: - See a dentist. - Think about PEG (feeding tube) placement. - Baseline hearing test (audiogram) with Dr. Tona Sensing audiologist. - Speech Pathology.   5.  I will see you again in about 2 wks to go over any last minute questions.

## 2011-09-07 ENCOUNTER — Ambulatory Visit (HOSPITAL_COMMUNITY): Payer: Managed Care, Other (non HMO) | Admitting: Dentistry

## 2011-09-07 ENCOUNTER — Encounter (HOSPITAL_COMMUNITY): Payer: Self-pay | Admitting: Dentistry

## 2011-09-07 ENCOUNTER — Telehealth: Payer: Self-pay | Admitting: Oncology

## 2011-09-07 ENCOUNTER — Other Ambulatory Visit (HOSPITAL_COMMUNITY): Payer: Self-pay | Admitting: Oncology

## 2011-09-07 DIAGNOSIS — K006 Disturbances in tooth eruption: Secondary | ICD-10-CM

## 2011-09-07 DIAGNOSIS — K0530 Chronic periodontitis, unspecified: Secondary | ICD-10-CM

## 2011-09-07 DIAGNOSIS — K036 Deposits [accretions] on teeth: Secondary | ICD-10-CM

## 2011-09-07 NOTE — Telephone Encounter (Signed)
called pt lmovm for barium swallow appt for 04/02 @ [facility]. asked pt to rtn call to confirm appt

## 2011-09-07 NOTE — Patient Instructions (Signed)

## 2011-09-07 NOTE — Progress Notes (Signed)
DENTAL CONSULTATION  Date of Consultation:  09/07/2011 Patient Name:   Steven Robinson Date of Birth:   09-06-1950 Medical Record Number: [id number]  VITALS: BP 142/86  Pulse 70  Temp 96.9 F (36.1 C)   CHIEF COMPLAINT: Patient needs a pre-chemoradiation therapy dental evaluation.  HPI: Steven Robinson is a 61 year old male referred by Dr. Lonie Peak for dental consultation. 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 Patient with anticipated chemoradiation therapy . Patient is now seen as part of a pre-chemoradiation dental protocol evaluation.  Patient currently denies acute toothache, swellings, or abscesses. Patient is seen on a every 6 month basis. Patient sees Dr. Marchia Bond for his dental care in [place], [place].  Patient denies having any unmet dental needs.    PMH: Past Medical History  Diagnosis Date  . Hyperlipoproteinemia   . Obstructive sleep apnea     on C-pap.   [name] GERD (gastroesophageal reflux disease)     chronic  . Hx of colonic polyps   . 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 Vertigo     2/2 intersitial neuritis.   [name] Hearing loss of left ear   . BPH (benign prostatic hyperplasia)   . 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 Hernia repair 1990    Left  . Fine needle aspiration 08/26/11     LEFT NECK FNA, LYMPH NODE, METASTATIC SQUAMOUS CELL CARCINOMA    ALLERGIES: Allergies  Allergen Reactions  . Cyclobenzaprine Hcl     confusion    MEDICATIONS: Current Outpatient Prescriptions  Medication Sig Dispense Refill  . ALPRAZolam (XANAX) 0.5 MG tablet Take by mouth as needed.       [name] aspirin 81 MG tablet Take 81 mg by mouth daily.      . diphenhydrAMINE (BENADRYL) 25 MG tablet Take 25 mg by mouth at bedtime as needed.       [name] DYMISTA 137-50 MCG/ACT SUSP Place into the nose as needed.       [name] HYDROcodone-acetaminophen (NORCO) 5-325 MG per tablet Take 1 tablet by mouth every 6  (six) hours as needed.       [name] LORazepam (ATIVAN) 0.5 MG tablet Take 1 tablet (0.5 mg total) by mouth every 6 (six) hours as needed (Nausea or vomiting).  30 tablet  0  . omega-3 acid ethyl esters (LOVAZA) 1 G capsule Take 4 g by mouth Nightly.       . ondansetron (ZOFRAN) 8 MG tablet Take 1 tablet two times a day as needed for nausea or vomiting starting on the third day after chemotherapy.  30 tablet  1  . prochlorperazine (COMPAZINE) 10 MG tablet Take 1 tablet (10 mg total) by mouth every 6 (six) hours as needed (Nausea or vomiting).  30 tablet  1  . rosuvastatin (CRESTOR) 10 MG tablet Take 40 mg by mouth daily.       . Tamsulosin HCl (FLOMAX) 0.4 MG CAPS Take by mouth.      . zolpidem (AMBIEN) 10 MG tablet Take 10 mg by mouth at bedtime as needed.        LABS: Lab Results  Component Value Date   WBC 5.1 09/06/2011   HGB 14.3 09/06/2011   HCT 42.1 09/06/2011   MCV 88.7 09/06/2011   PLT 153 09/06/2011      Component Value Date/Time   NA 138 09/06/2011 1036   K 4.1 09/06/2011  1036   CL 106 09/06/2011 1036   CO2 26 09/06/2011 1036   GLUCOSE 96 09/06/2011 1036   BUN 15 09/06/2011 1036   CREATININE 0.81 09/06/2011 1036   CALCIUM 9.1 09/06/2011 1036   No results found for this basename: INR, PROTIME   No results found for this basename: PTT    SOCIAL HISTORY: History   Social History  . Marital Status: Married    Spouse Name: N/A    Number of Children: 2  . Years of Education: N/A   Occupational History  .      Pharmacy Rep Manager   Social History Main Topics  . Smoking status: Former Smoker -- 0.2 packs/day for 3 years    Quit date: 06/14/1972  . Smokeless tobacco: Not on file  . Alcohol Use: No     Hx of Drinking wine - Stopped 3 years ago  . Drug Use: No  . Sexually Active: Yes    Birth Control/ Protection: None   Other Topics Concern  . Not on file   Social History Narrative  . No narrative on file    FAMILY HISTORY: Family History  Problem Relation Age of  Onset  . Alzheimer's disease Mother   . Tics Mother   . Hypertension Father   . Cancer Father 25    Prostate  . Stroke Paternal Grandmother   . Cancer Paternal Grandfather     Lung  and Prostate     REVIEW OF SYSTEMS: Reviewed with patient and positive as above.  DENTAL HISTORY: CHIEF COMPLAINT: Patient needs a pre-chemoradiation therapy dental evaluation.  HPI: Steven Robinson is a 61 year old male referred by Dr. Lonie Peak for dental consultation. 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 Patient with anticipated chemoradiation therapy . Patient is now seen as part of a pre-chemoradiation dental protocol evaluation.  Patient currently denies acute toothache, swellings, or abscesses. Patient is seen on a every 6 month basis. Patient sees Dr. Marchia Bond for his dental care in [place], [place].  Patient denies having any unmet dental needs.   DENTAL EXAMINATION:  GENERAL: Patient is a well-developed, well-nourished male in no acute distress. HEAD AND NECK: Patient with left neck lymphadenopathy. There is no obvious right neck lymphadenopathy. Patient denies acute TMJ symptoms. INTRAORAL EXAM: Xerostomia is noted. Patient has bilateral mandibular lingual tori. Patient also with lingual exostoses in the area of #19-20. DENTITION: Patient is missing tooth numbers 1, 16 and 32. Tooth #17 is a full bony impaction. PERIODONTAL: Patient with chronic periodontitis with plaque accumulations. Patient with incipient bone loss.  DENTAL CARIES/SUBOPTIMAL RESTORATIONS: Patient with no obvious dental caries or suboptimal dental restorations. Mandibular anterior attrition is noted. ENDODONTIC: Patient currently denies acute pulpitis symptoms. Patient has had previous root canal therapies associated with tooth #19.  CROWN AND BRIDGE: Patient with multiple crown restorations that appear to be clinically acceptable. Esthetics of PFM #9 is less than ideal. PROSTHODONTIC: No  need for partial dentures. OCCLUSION: Patient with a were occlusal scheme but a stable occlusion .  RADIOGRAPHIC INTERPRETATION: (A panoramic x-ray was taken and supplemented with a full series of dental radiographs.) There are missing tooth numbers 1, 16 and 32. Tooth #17 is impacted. Patient has multiple crown restorations. Patient with previous root canal therapy #19 with no obvious persistent periapical pathology. Patient with incipient bone loss.    ASSESSMENTS: 1. Chronic periodontitis with incipient bone loss 2. Plaque accumulations 3. Selective areas of gingival recession 4.  Impacted tooth #17 5. Poor occlusal scheme but a stable occlusion. 6. Xerostomia 7. Bilateral mandibular lingual tori 8. Lingual exostoses in the area of numbers 19-20. 9. Mandibular anterior incisal attrition of tooth numbers 23, 24, 25, and 26.    PLAN/RECOMMENDATIONS: 1. I discussed the risks, benefits, and complications of various treatment options with the patient in relationship to the medical and dental conditions. We discussed various treatment options to include no treatment, multiple extraction of teeth in the primary field of radiation therapy including impacted tooth #17 , alveoloplasty, pre-prosthetic surgery as indicated, periodontal therapy, dental restorations, root canal therapy, crown and bridge therapy, implant therapy, and replacement of missing teeth as indicated. We also discussed fabrication of fluoride trays and scatter protection devices.  The patient currently wishes to think about his options at this time. Patient most likely will proceed with second opinion evaluation by an oral surgeon for evaluation of extraction of teeth in the primary field of radiation therapy as well as impacted tooth #17 along with the bilateral mandibular lingual tori and lateral exostoses reductions as needed. Ultimately the decision will be based upon whether a primary tumor can be identified and whether the  anticipated radiation ports can be delineated better.   2. Discussion of findings with  Dr. Basilio Cairo and Dr. Gaylyn Rong and coordination of future medical and dental care.    Charlynne Pander, DDS

## 2011-09-08 ENCOUNTER — Other Ambulatory Visit: Payer: Self-pay | Admitting: Radiology

## 2011-09-08 ENCOUNTER — Other Ambulatory Visit (HOSPITAL_COMMUNITY): Payer: Self-pay

## 2011-09-08 ENCOUNTER — Telehealth: Payer: Self-pay | Admitting: Oncology

## 2011-09-08 NOTE — Telephone Encounter (Signed)
pt rtn call and confirmed barium swallow appt for 04/02

## 2011-09-09 ENCOUNTER — Other Ambulatory Visit: Payer: Self-pay | Admitting: Otolaryngology

## 2011-09-09 ENCOUNTER — Encounter (HOSPITAL_COMMUNITY): Payer: Self-pay | Admitting: Dentistry

## 2011-09-10 ENCOUNTER — Telehealth (HOSPITAL_COMMUNITY): Payer: Self-pay | Admitting: Oncology

## 2011-09-13 ENCOUNTER — Other Ambulatory Visit (HOSPITAL_COMMUNITY): Payer: Self-pay | Admitting: *Deleted

## 2011-09-13 ENCOUNTER — Ambulatory Visit (HOSPITAL_COMMUNITY)
Admission: RE | Admit: 2011-09-13 | Discharge: 2011-09-13 | Disposition: A | Discharge status: Home / Self Care | Payer: Managed Care, Other (non HMO) | Source: Ambulatory Visit | Attending: Oncology | Admitting: Oncology

## 2011-09-13 ENCOUNTER — Telehealth: Payer: Self-pay

## 2011-09-13 DIAGNOSIS — C109 Malignant neoplasm of oropharynx, unspecified: Secondary | ICD-10-CM | POA: Insufficient documentation

## 2011-09-13 DIAGNOSIS — C770 Secondary and unspecified malignant neoplasm of lymph nodes of head, face and neck: Secondary | ICD-10-CM | POA: Insufficient documentation

## 2011-09-13 DIAGNOSIS — G4733 Obstructive sleep apnea (adult) (pediatric): Secondary | ICD-10-CM | POA: Insufficient documentation

## 2011-09-13 DIAGNOSIS — K219 Gastro-esophageal reflux disease without esophagitis: Secondary | ICD-10-CM | POA: Insufficient documentation

## 2011-09-13 LAB — CBC
MCH: 29.8 pg (ref 26.0–34.0)
MCHC: 34.4 g/dL (ref 30.0–36.0)
Platelets: 197 10*3/uL (ref 150–400)
RBC: 5.14 MIL/uL (ref 4.22–5.81)
RDW: 12.4 % (ref 11.5–15.5)

## 2011-09-13 LAB — BASIC METABOLIC PANEL
Calcium: 9.1 mg/dL (ref 8.4–10.5)
Creatinine, Ser: 0.82 mg/dL (ref 0.50–1.35)
GFR calc non Af Amer: >90 mL/min (ref >90)
Glucose, Bld: 116 mg/dL — ABNORMAL HIGH (ref 70–99)
Sodium: 139 mEq/L (ref 135–145)

## 2011-09-13 LAB — PROTIME-INR: Prothrombin Time: 12.4 seconds (ref 11.6–15.2)

## 2011-09-13 MED ORDER — GLUCAGON HCL (RDNA) 1 MG IJ SOLR
INTRAMUSCULAR | Status: AC
  Administered 2011-09-13: 1 mg
  Filled 2011-09-13: qty 1

## 2011-09-13 MED ORDER — SODIUM CHLORIDE 0.9 % IV SOLN
Freq: Once | INTRAVENOUS | Status: AC
  Administered 2011-09-13: 08:00:00 via INTRAVENOUS

## 2011-09-13 MED ORDER — MIDAZOLAM HCL 5 MG/5ML IJ SOLN
INTRAMUSCULAR | Status: AC | PRN
  Administered 2011-09-13: 2 mg via INTRAVENOUS
  Administered 2011-09-13 (×2): 1 mg via INTRAVENOUS

## 2011-09-13 MED ORDER — MIDAZOLAM HCL 2 MG/2ML IJ SOLN
INTRAMUSCULAR | Status: AC
  Filled 2011-09-13: qty 6

## 2011-09-13 MED ORDER — CEFAZOLIN SODIUM-DEXTROSE 2-3 GM-% IV SOLR
INTRAVENOUS | Status: AC
  Administered 2011-09-13: 2000 mg
  Filled 2011-09-13: qty 50

## 2011-09-13 MED ORDER — FENTANYL CITRATE 0.05 MG/ML IJ SOLN
INTRAMUSCULAR | Status: AC
  Filled 2011-09-13: qty 6

## 2011-09-13 MED ORDER — CEFAZOLIN SODIUM 1-5 GM-% IV SOLN: 1.0000 g | Freq: Once | INTRAVENOUS | Status: DC

## 2011-09-13 MED ORDER — FENTANYL CITRATE 0.05 MG/ML IJ SOLN
INTRAMUSCULAR | Status: AC | PRN
  Administered 2011-09-13: 50 ug via INTRAVENOUS
  Administered 2011-09-13 (×2): 100 ug via INTRAVENOUS

## 2011-09-13 NOTE — Discharge Instructions (Signed)
Gastrostomy Tube, Adult  A gastrostomy tube is a tube that is placed into the stomach. It is also called a "G-tube." This tube is used for:   Feeding.   Giving medication.  CLEANING THE G-TUBE SITE   Wash your hands with soap and water.   Remove the old dressing (if any). Some styles of G-tubes may need a dressing inserted between the skin and the G-tube. Other types of G-tubes do not require a dressing. Ask your caregiver if a dressing is needed.   Check the area where the tube enters the skin (insertion site) for redness, swelling, or pus-like (purulent) drainage. A small amount of clear or tan liquid drainage is normal. Check to make sure scar tissue (skin) is not growing around the insertion site. This could have a raised, bumpy appearance.   A cotton swab can be used to clean the skin around the tube:   When the G-tube is first put in, a normal saline solution or water can be used to clean the skin.   Mild soap and warm water can be used when the skin around the G-tube site has healed.   Roll the cotton swab around the G-tube insertion site to remove any drainage or crusting at the insertion site.  RESIDUALS  Feeding tube residuals are the amount of liquids that are in the stomach at any given time. Residuals may be checked before giving feedings, medications, or as instructed by your caregiver.   Ask your caregiver if there are instances when you would not start tube feedings depending on the amount or type of contents withdrawn from the stomach.   Check residuals by attaching a syringe to the G-tube and pull back on the syringe plunger. Note the amount and return the residual back into the stomach.  FLUSHING THE G-TUBE   The G-tube should be periodically flushed with clean warm water to keep it from clogging.   Flush the G-tube after feedings or medications. Draw up 30 mLs of warm water in a syringe. Connect the syringe to the G-tube and slowly push the water into the tube.   Do not push  feedings, medications, or flushes rapidly. Flush the G-tube gently and slowly.   Only use syringes made for G-tubes to flush medications or feedings.   Your caregiver may want the G-tube flushed more often or with more water. If this is the case, follow your caregiver's instructions.  FEEDINGS  Your caregiver will determine whether feedings are given as a bolus (a certain amount given at one time and at scheduled times) or whether feedings will be given continuously on a feeding pump.    Formulas should be given at room temperature.   If feedings are continuous, no more than 4 hours worth of feedings should be placed in the feeding bag. This helps prevent spoilage or accidental excess infusion.   Cover and place unused formula in the refrigerator.   If feedings are continuous, stop the feedings when medications or flushes are given. Be sure to restart the feedings.   Feeding bags and syringes should be replaced as instructed by your caregiver.  GIVING MEDICATION    In general, it is best if all medications are in a liquid form for G-tube administration. Liquid medications are less likely to clog the G-tube.   Mix the liquid medication with 30 mLs (or amount recommended by your caregiver) of warm water.   Draw up the medication into the syringe.   Attach the syringe to   the G-tube and slowly push the mixture into the G-tube.   After giving the medication, draw up 30 mLs of warm water in the syringe and slowly flush the G-tube.   For pills or capsules, check with your caregiver first before crushing medications. Some pills are not effective if they are crushed. Some capsules are sustained release medications.   If appropriate, crush the pill or capsule and mix with 30 mLs of warm water. Using the syringe, slowly push the medication through the tube, then flush the tube with another 30 mLs of tap water.  G-TUBE PROBLEMS  G-tube was pulled out.   Cause: May have been pulled out accidentally.   Solutions:  Cover the opening with clean dressing and tape. Call your caregiver right away. The G-tube should be put in as soon as possible (within 4 hours) so the G-tube opening (tract) does not close. The G-tube needs to be put in at a healthcare setting. An X-ray needs to be done to confirm placement before the G-tube can be used again.  Redness, irritation, soreness, or foul odor around the gastrostomy site.   Cause: May be caused by leakage or infection.   Solutions: Call your caregiver right away.  Large amount of leakage of fluid or mucus-like liquid present (a large amount means it soaks clothing).   Cause: Many reasons could cause the G-tube to leak.   Solutions: Call your caregiver to discuss the amount of leakage.  Skin or scar tissue appears to be growing where tube enters skin.    Cause: Tissue growth may develop around the insertion site if the G-tube is moved or pulled on excessively.   Solutions: Secure tube with tape so that excess movement does not occur. Call your caregiver.  G-tube is clogged.   Cause: Thick formula or medication.   Solutions: Try to slowly push warm water into the tube with a large syringe. Never try to push any object into the tube to unclog it. Do not force fluid into the G-tube. If you are unable to unclog the tube, call your caregiver right away.  TIPS   Head of Bed (HOB) position refers to the upright position of a person's upper body.   When giving medications or a feeding bolus, keep the HOB up as told by your caregiver. Do this during the feeding and for 1 hour after the feeding or medication administration.   If continuous feedings are being given, it is best to keep the HOB up as told by your caregiver. When ADLs (Activities of Daily Living) are performed and the HOB needs to be flat, be sure to turn the feeding pump off. Restart the feeding pump when the HOB is returned to the recommended height.   Do not pull or put tension on the tube.   To prevent fluid backflow,  kink the G-tube before removing the cap or disconnecting a syringe.   Check the G-tube length every day. Measure from the insertion site to the end of the G-tube. If the length is longer than previous measurements, the tube may be coming out. Call your caregiver if you notice increasing G-tube length.   Oral care, such as brushing teeth, must be continued.   You may need to remove excess air (vent) from the G-tube. Your caregiver will tell you if this is needed.   Always call your caregiver if you have questions or problems with the G-tube.  SEEK IMMEDIATE MEDICAL CARE IF:    You have severe   abdominal pain, tenderness, or abdominal bloating(distension).   You have nausea or vomiting.   You are constipated or have problems moving your bowels.   The G-tube insertion site is red, swollen, has a foul smell, or has yellow or brown drainage.   You have difficulty breathing or shortness of breath.   You have a fever.   You have a large amount of feeding tube residuals.   The G-tube is clogged and cannot be flushed.  MAKE SURE YOU:    Understand these instructions.   Will watch your condition.   Will get help right away if you are not doing well or get worse.  Document Released: 08/09/2001 Document Revised: 05/20/2011 Document Reviewed: 09/26/2007  ExitCare Patient Information 2012 [employer].

## 2011-09-13 NOTE — Progress Notes (Signed)
Spoke with Norberta Keens at [facility] [phone]); nurse to come for home visit for PEG teaching tomorrow or Wednesday.

## 2011-09-13 NOTE — Telephone Encounter (Signed)
Received call from [facility], RN at Short Stay, stating that pt is there and just had G-tube placed per IR this morning.  She states pt will be discharged at 12 pm, but he and family state they have not been given any instructions on the care/maintenance of the G-tube.  Pt needs to be set up with this.  Informed her will relay this information to desk RN, and office will call her back.

## 2011-09-13 NOTE — Progress Notes (Signed)
Patient will not get [facility] until 09/15/11: therefore, writer instructed wife and patient in G tube dressing change using triple antibiotic ointment, 4x4 guaze and paper tape. Wife verbalizes understanding.

## 2011-09-13 NOTE — Progress Notes (Signed)
Pt and wife instructed in flushing peg tube with 30 cc NS. Given irrigation set and 500cc NS. Wife returns demonstation. To get [facility] starting 09/14/11. Swallowing study for speech therapy changed from 09/14/11 to 09/21/11. Phone numbers for [facility] and radiology provided to family with discharge instructions.

## 2011-09-13 NOTE — Procedures (Signed)
Successful fluoroscopic guided insertion of gastrostomy tube without immediate post procedural complicatoin.   The gastrostomy tube may be used immediately for medications.  Otherwise, place gastrostomy tube to low wall suction for 24 hrs.  Tube feeds may be initiated in 24 hours as per the primary team.   

## 2011-09-13 NOTE — H&P (Signed)
Steven Robinson is an 61 y.o. male.   Chief Complaint: "I'm here for a stomach tube" HPI: Patient with history of oropharyngeal cancer presents today for percutaneous gastrostomy tube placement prior to chemoradiation.  Past Medical History  Diagnosis Date  . Hyperlipoproteinemia   . Obstructive sleep apnea     on C-pap.   [name] GERD (gastroesophageal reflux disease)     chronic  . Hx of colonic polyps   . 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 Vertigo     2/2 intersitial neuritis.   [name] Hearing loss of left ear   . BPH (benign prostatic hyperplasia)   . 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 Hernia repair 1990    Left  . Fine needle aspiration 08/26/11     LEFT NECK FNA, LYMPH NODE, METASTATIC SQUAMOUS CELL CARCINOMA    Family History  Problem Relation Age of Onset  . Alzheimer's disease Mother   . Tics Mother   . Hypertension Father   . Cancer Father 12    Prostate  . Stroke Paternal Grandmother   . Cancer Paternal Grandfather     Lung  and Prostate   Social History:  reports that he quit smoking about 39 years ago. He does not have any smokeless tobacco history on file. He reports that he does not drink alcohol or use illicit drugs.  Allergies:  Allergies  Allergen Reactions  . Cyclobenzaprine Hcl     confusion    Medications Prior to Admission  Medication Sig Dispense Refill  . ALPRAZolam (XANAX) 0.5 MG tablet Take by mouth as needed.       [name] aspirin 81 MG tablet Take 81 mg by mouth daily.      . diphenhydrAMINE (BENADRYL) 25 MG tablet Take 25 mg by mouth at bedtime as needed.       [name] DYMISTA 137-50 MCG/ACT SUSP Place into the nose as needed.       [name] HYDROcodone-acetaminophen (NORCO) 5-325 MG per tablet Take 1 tablet by mouth every 6 (six) hours as needed.       [name] LORazepam (ATIVAN) 0.5 MG tablet Take 1 tablet (0.5 mg total) by mouth every 6 (six) hours as needed (Nausea or vomiting).  30 tablet  0  . omega-3 acid ethyl  esters (LOVAZA) 1 G capsule Take 4 g by mouth Nightly.       . ondansetron (ZOFRAN) 8 MG tablet Take 1 tablet two times a day as needed for nausea or vomiting starting on the third day after chemotherapy.  30 tablet  1  . prochlorperazine (COMPAZINE) 10 MG tablet Take 1 tablet (10 mg total) by mouth every 6 (six) hours as needed (Nausea or vomiting).  30 tablet  1  . rosuvastatin (CRESTOR) 10 MG tablet Take 40 mg by mouth daily.       . Tamsulosin HCl (FLOMAX) 0.4 MG CAPS Take by mouth.      . zolpidem (AMBIEN) 10 MG tablet Take 10 mg by mouth at bedtime as needed.       Medications Prior to Admission  Medication Dose Route Frequency Provider Last Rate Last Dose  . 0.9 %  sodium chloride infusion   Intravenous Once Robet Leu, PA      . ceFAZolin (ANCEF) 2-3 GM-% IVPB SOLR           . ceFAZolin (ANCEF) IVPB 1 g/50 mL premix  1 g Intravenous Once  Robet Leu, PA        Results for orders placed during the hospital encounter of 09/13/11 (from the past 48 hour(s))  APTT     Status: Normal   Collection Time   09/13/11  7:25 AM      Component Value Range Comment   aPTT 31  24 - 37 (seconds)   CBC     Status: Normal   Collection Time   09/13/11  7:25 AM      Component Value Range Comment   WBC 6.2  4.0 - 10.5 (K/uL)    RBC 5.14  4.22 - 5.81 (MIL/uL)    Hemoglobin 15.3  13.0 - 17.0 (g/dL)    HCT 46.9  62.9 - 52.8 (%)    MCV 86.6  78.0 - 100.0 (fL)    MCH 29.8  26.0 - 34.0 (pg)    MCHC 34.4  30.0 - 36.0 (g/dL)    RDW 41.3  24.4 - 01.0 (%)    Platelets 197  150 - 400 (K/uL)   PROTIME-INR     Status: Normal   Collection Time   09/13/11  7:25 AM      Component Value Range Comment   Prothrombin Time 12.4  11.6 - 15.2 (seconds)    INR 0.91  0.00 - 1.49     No results found.  Review of Systems  Constitutional: Negative for fever and chills.  Respiratory: Positive for cough. Negative for shortness of breath.   Cardiovascular: Negative for chest pain.  Gastrointestinal: Negative for  nausea, vomiting and abdominal pain.  Musculoskeletal: Negative for back pain.  Neurological: Negative for headaches.  Endo/Heme/Allergies: Does not bruise/bleed easily.    Blood pressure 115/66, pulse 63, temperature 97.6 F (36.4 C), temperature source Oral, resp. rate 16, height 6\' 1"  (1.854 m), weight 285 lb (129.275 kg), SpO2 94.00%. Physical Exam  Constitutional: He is oriented to person, place, and time. He appears well-developed and well-nourished.  Cardiovascular: Normal rate and regular rhythm.   Respiratory: Effort normal and breath sounds normal.  GI: Soft. Bowel sounds are normal.  Musculoskeletal: Normal range of motion. He exhibits no edema.  Neurological: He is alert and oriented to person, place, and time.     Assessment/Plan Patient with oropharyngeal cancer; plan is for percutaneous gastrostomy tube placement prior to chemoradiation. Details/risks of procedure d/w pt/wife with their understanding and consent.  Tashi Andujo,D KEVIN 09/13/2011, 8:18 AM

## 2011-09-14 ENCOUNTER — Other Ambulatory Visit (HOSPITAL_COMMUNITY): Payer: Managed Care, Other (non HMO)

## 2011-09-14 ENCOUNTER — Encounter (HOSPITAL_COMMUNITY): Payer: Managed Care, Other (non HMO)

## 2011-09-14 ENCOUNTER — Ambulatory Visit (HOSPITAL_COMMUNITY): Admission: RE | Admit: 2011-09-14 | Payer: Managed Care, Other (non HMO) | Source: Ambulatory Visit

## 2011-09-14 NOTE — Progress Notes (Signed)
Received audiogram report from [facility]; forwarded to Dr. Gaylyn Rong.

## 2011-09-16 ENCOUNTER — Encounter (HOSPITAL_COMMUNITY): Payer: Self-pay | Admitting: Dentistry

## 2011-09-16 ENCOUNTER — Ambulatory Visit (HOSPITAL_COMMUNITY): Payer: Self-pay | Admitting: Dentistry

## 2011-09-16 VITALS — BP 140/84 | HR 72 | Temp 97.3°F

## 2011-09-16 DIAGNOSIS — Z0189 Encounter for other specified special examinations: Secondary | ICD-10-CM

## 2011-09-16 DIAGNOSIS — K08109 Complete loss of teeth, unspecified cause, unspecified class: Secondary | ICD-10-CM

## 2011-09-16 DIAGNOSIS — K08199 Complete loss of teeth due to other specified cause, unspecified class: Secondary | ICD-10-CM

## 2011-09-16 DIAGNOSIS — K08409 Partial loss of teeth, unspecified cause, unspecified class: Secondary | ICD-10-CM

## 2011-09-16 DIAGNOSIS — Z463 Encounter for fitting and adjustment of dental prosthetic device: Secondary | ICD-10-CM

## 2011-09-16 NOTE — Patient Instructions (Signed)
FLUORIDE TRAYS PATIENT INSTRUCTIONS    Obtain prescription from the pharmacy.  Don't be surprised if it needs to be ordered.   Be sure to let the pharmacy know when you are close to needing a new refill for them to have it ready for you without interruption of Fluoride use.   The best time to use your Fluoride is before bed time.   You must brush your teeth very well and floss before using the Fluoride in order to get the best use out of the Fluoride treatments.   Place 1 drop of Fluoride gel per tooth in the tray.   Place the tray on your lower teeth and/or your upper teeth.  Make sure the trays are seated all the way.  Remember, they only fit one way on your teeth.   Insert for 5 full minutes.   At the end of the 5 minutes, take the trays out.  SPIT OUT excess. .    Do NOT rinse your mouth!    Do NOT eat or drink after treatments for at least 30 minutes.  This is why the best time for your treatments is before bedtime.    Clean the inside of your Fluoride trays using COLD WATER and a toothbrush.    In order to keep your Trays from discoloring and free from odors, soak them overnight in denture cleaners such as Efferdent.  Do not use bleach or non denture products.    Store the trays in a safe dry place AWAY from any heat until your next treatment.    Bring the trays with you for your next dental check-up.  The dentist will confirm their fit.    If anything happens to your Fluoride trays, or they don't fit as well after any dental work, please let us know as soon as possible.  TRISMUS  Trismus is a condition where the jaw does not allow the mouth to open as wide as it usually does.  This can happen almost suddenly, or in other cases the process is so slow, it is hard to notice it-until it is too far along.  When the jaw joints and/or muscles have been exposed to radiation treatments, the onset of Trismus is very slow.  This is because the muscles are losing  their stretching ability over a long period of time, as long as 2 YEARS after the end of radiation.  It is therefore important to exercise these muscles and joints.  TRISMUS EXERCISES   Stack of tongue depressors measuring the same or a little less than the last documented MIO (Maximum Interincisal Opening).  Secure them with a rubber band on both ends.  Place the stack in the patient's mouth, supporting the other end.  Allow 30 seconds for muscle stretching.  Rest for a few seconds.  Repeat 3-5 times  For all radiation patients, this exercise is recommended in the mornings and evenings unless otherwise instructed.  The exercise should be done for a period of 2 YEARS after the end of radiation.  MIO should be checked routinely on recall dental visits by the general dentist or the hospital dentist.  The patient is advised to report any changes, soreness, or difficulties encountered when doing the exercises. 

## 2011-09-16 NOTE — Progress Notes (Signed)
Thursday, September 16, 2011  BP: 140/84           P: 72        T:   97.3  Steven Robinson presents for insertion of upper and lower fluoride trays and Scatter protection devices. Patient has just undergone multiple extractions with alveoloplasty and pre-prosthetic surgery with Dr. Chales Salmon prior to this visit. Patient feels fine and is still numb from the dental procedures.  Procedure: Appliances tried in and were adjusted prn. [place]. Trismus device fabricated as well with maximum interincisal opening of 44 mm and use of 27 sticks for trismus device at 42 mm. Postop instructions were provided in a written and verbal format on the use and care of appliances. All questions answered. RTC for periodic oral exam during radiation therapy in three to four weeks. Patient to call for appointment once treatment time is determined. Call if questions or problems before then. Patient is aware that decision on timing of the radiation therapy will need to be made after discussion between Dr. Basilio Cairo and Dr. Chales Salmon. Dr. Cindra Eves -

## 2011-09-20 ENCOUNTER — Telehealth: Payer: Self-pay | Admitting: *Deleted

## 2011-09-20 ENCOUNTER — Encounter: Payer: Managed Care, Other (non HMO) | Admitting: Nutrition

## 2011-09-20 ENCOUNTER — Other Ambulatory Visit: Payer: Self-pay | Admitting: *Deleted

## 2011-09-20 ENCOUNTER — Ambulatory Visit (HOSPITAL_COMMUNITY)
Admission: RE | Admit: 2011-09-20 | Discharge: 2011-09-20 | Disposition: A | Discharge status: Home / Self Care | Payer: Managed Care, Other (non HMO) | Source: Ambulatory Visit | Attending: Oncology | Admitting: Oncology

## 2011-09-20 ENCOUNTER — Ambulatory Visit: Payer: Managed Care, Other (non HMO) | Admitting: Nutrition

## 2011-09-20 ENCOUNTER — Other Ambulatory Visit: Payer: Managed Care, Other (non HMO)

## 2011-09-20 DIAGNOSIS — K9429 Other complications of gastrostomy: Secondary | ICD-10-CM | POA: Insufficient documentation

## 2011-09-20 DIAGNOSIS — C760 Malignant neoplasm of head, face and neck: Secondary | ICD-10-CM | POA: Insufficient documentation

## 2011-09-20 DIAGNOSIS — Y833 Surgical operation with formation of external stoma as the cause of abnormal reaction of the patient, or of later complication, without mention of misadventure at the time of the procedure: Secondary | ICD-10-CM | POA: Insufficient documentation

## 2011-09-20 MED ORDER — HYDROCODONE-ACETAMINOPHEN 7.5-500 MG/15ML PO SOLN
15.0000 mL | Freq: Four times a day (QID) | ORAL | Status: DC | PRN
Start: 2011-09-20 — End: 2011-09-24

## 2011-09-20 NOTE — Progress Notes (Signed)
Patient presents today for evaluation of his gastric tube which was placed by IR 09/13/2011.  Patient states that over the last few days he has had increasing pain, redness at the site and now with foul smelling purulent drainage which started yesterday.  He has been on amoxicillin 500 mg tid for 5 days post dental extractions.  He denies fever at home.  He has been cleaning the area with hydrogen peroxide and warm water.  He is not currently using his gastric tube except for water flushes.  PE:  Afebrile at 97.6. VSS Pleasant gentleman with obese abdomen and gastric tube intact.  Purulent and serous drainage noted at the insertion site as well as within the tubing.  He has widespread erythema and warmth approximately 5-6 inch span from his gastric tube insertion site worrisome for cellulitis. Positive bowel sounds are noted.    I cultured his drainage today and sent off for GS&C with sensitivities.  The bumper was slightly retracted to allow for minimal skin breakdown and cleansed with normal saline after culture obtained.  Topical bacitracin ointment was applied as well as a new dressing.  Patient was prescribed Keflex 500 to take q 6 hours for 10 days.  He is to change his dressing 2 times minimum daily, heat to be applied to the abdomen area 1-2 times a day and a topical antibiotic with dressing changes.  Patient is able to shower and was told to cleanse the site with peroxide at least once daily.  He was also advised he may take a anti-inflammatory such as naproxen for inflammation. He is to contact me within the next 48 hours for worsening symptoms; other wise - to follow the above measures and I will call him once culture results received.   Total time spent with patient was approximately 20 minutes.

## 2011-09-20 NOTE — Telephone Encounter (Signed)
Rx for Lortab elixir given to pt in chemo education room.

## 2011-09-20 NOTE — Assessment & Plan Note (Signed)
Steven Robinson is a 61 year old male patient of Dr. Lodema Pilot diagnosed with oropharyngeal cancer.    Past medical history includes sleep apnea, hyperlipoproteinemia, GERD, tobacco and alcohol use.    Medications include Xanax, Ativan, Lovaza, Zofran, Compazine, Crestor.    Labs include a glucose of 116.    Height 73 inches, weight 285 pounds, usual body weight 285 pounds documented in March.  BMI is 37.61.  Estimated nutrition needs: 2600-2800 calories, 136-150 g protein, 2.8 to 3 mL of fluid daily.  The patient and wife present to nutrition consult.  The patient has not yet begun concurrent chemoradiation therapy.  He has had a PEG placed which was evaluated today and he is being treated for an infection of his PEG site.  He is status post dental extractions which are healing. He denies any nutritional issues at this time.  He is interested in nutrition information while going through treatment.  NUTRITION DIAGNOSES: 1. Predicted suboptimal energy intake related to new diagnosis of     oropharyngeal cancer and associated treatments as evidenced by     history or presence of this condition for which research shows an     increased incidence of suboptimal energy intake. 2. Food and nutrition related knowledge deficit related to new     diagnosis of oropharyngeal cancer, as evidenced by no prior need     for nutrition related information.  INTERVENTION:  I educated the patient and wife on the importance of a healthy plant based diet with adequate protein and calories to preserve lean muscle mass and prevent malnutrition.  I have educated them on sources of protein.  We discussed healthy food choices.  I briefly discussed strategies for dealing with nausea and constipation.  I have encouraged him to be compliant with his swallowing exercises.  I have also discussed options for tube feeding and discussed how tube feedings would be initiated if weight loss became apparent during treatment.  I provided him  with multiple fact sheets and nutritional samples for him to try.  I have encouraged him to continue small amounts of food often throughout the day, focusing on high quality food choices.  I have answered multiple questions.  MONITORING/EVALUATION (GOALS):  That the patient will tolerate a healthy diet with adequate calories and proteins to minimize weight loss during treatment and to maintain lean muscle mass.  NEXT VISIT:  I will see patient on his first day of chemotherapy.    ______________________________ Zenovia Jarred, RD, LDN Clinical Nutrition Specialist BN/MEDQ  D:  09/20/2011  T:  09/20/2011  Job:  [id number]

## 2011-09-20 NOTE — Telephone Encounter (Signed)
Called IR and spoke w/ Inetta Fermo regarding eval pt for c/o pain and leaking at PEG tube site per Dr. Gaylyn Rong.  She instructed for pt to arrive to [facility] after his chemo class today at aprox 11:30am.  She will add him to their schedule.  Called pt and instructed above.  He verbalized understanding.

## 2011-09-21 ENCOUNTER — Telehealth: Payer: Self-pay | Admitting: *Deleted

## 2011-09-21 ENCOUNTER — Ambulatory Visit (HOSPITAL_COMMUNITY)
Admission: RE | Admit: 2011-09-21 | Discharge: 2011-09-21 | Disposition: A | Discharge status: Home / Self Care | Payer: Managed Care, Other (non HMO) | Source: Ambulatory Visit | Attending: Oncology | Admitting: Oncology

## 2011-09-21 DIAGNOSIS — R05 Cough: Secondary | ICD-10-CM | POA: Insufficient documentation

## 2011-09-21 DIAGNOSIS — R1310 Dysphagia, unspecified: Secondary | ICD-10-CM | POA: Insufficient documentation

## 2011-09-21 DIAGNOSIS — K219 Gastro-esophageal reflux disease without esophagitis: Secondary | ICD-10-CM | POA: Insufficient documentation

## 2011-09-21 DIAGNOSIS — R059 Cough, unspecified: Secondary | ICD-10-CM | POA: Insufficient documentation

## 2011-09-21 DIAGNOSIS — C109 Malignant neoplasm of oropharynx, unspecified: Secondary | ICD-10-CM

## 2011-09-21 DIAGNOSIS — C760 Malignant neoplasm of head, face and neck: Secondary | ICD-10-CM | POA: Insufficient documentation

## 2011-09-21 DIAGNOSIS — E785 Hyperlipidemia, unspecified: Secondary | ICD-10-CM | POA: Insufficient documentation

## 2011-09-21 DIAGNOSIS — G4733 Obstructive sleep apnea (adult) (pediatric): Secondary | ICD-10-CM | POA: Insufficient documentation

## 2011-09-21 NOTE — Telephone Encounter (Addendum)
Returned Mr. Handley call.  He reported that he will have another procedure by Dr. Jearld Fenton in regards to ensure he has the proper diagnosis for his head and neck cancer.  He states he spoke with Dr. Basilio Cairo this am and she validated the need for the second look study.  Mr. Hartline reports that his Peg tube which was inserted last week was infected this weekend which required intervention by IR and he is presently on antibiotics with a cleansing regimen as instructed by Radiology. He is scheduled for simulation on tomorrow with Dr. Basilio Cairo.

## 2011-09-21 NOTE — Procedures (Signed)
Objective Swallowing Evaluation: Modified Barium Swallowing Study  Patient Details  Name: Steven Robinson MRN: [id number] Date of Birth: [birth year]  Today's Date: 09/21/2011 Time: 1015 - 1101    Past Medical History:  Past Medical History  Diagnosis Date  . Hyperlipoproteinemia   . Obstructive sleep apnea     on C-pap.   [name] GERD (gastroesophageal reflux disease)     chronic  . Hx of colonic polyps   . 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 Vertigo     2/2 intersitial neuritis.   [name] Hearing loss of left ear   . BPH (benign prostatic hyperplasia)   . Oropharynx cancer 08/26/2011   Past Surgical History:  Past Surgical History  Procedure Date  . Hernia repair 1990    Left  . Fine needle aspiration 08/26/11     LEFT NECK FNA, LYMPH NODE, METASTATIC SQUAMOUS CELL CARCINOMA   HPI:  Pt is a 61 yo old male diagnosed with head and neck cancer February 2013, s/p PEG placement *that is now infected per pt*.  Plans are for pt to have chemoradiation- pt reports he is to receive cisplatin.  PMH + for GERD, hyperlipidemia, sleep apnea.  Pt reports he has been treated by Dr Elnoria Howard for GERD x5 years. Pt reports good compliance with medications.  He denies overt problems swallowing - is consuming soft foods d/t dental extraction last week.  Upon further testing, pt reports he had issues with chronic cough and throat clearing and was subsequently placed on a PPI.  Pt denies pulmonary infections but does acknowledge small amount of weight loss.  Pt presents today with his wife.  He has seen Verdie Mosher at [facility] for dysphagia and has been completing his HEP - minus Shaker head lift.       Recommendation/Prognosis  Clinical Impression Dysphagia Diagnosis: Within Functional Limits Clinical impression: Pt currently presents with a functional oropharyngeal swallow without aspiration, penetration of any consistency tested.  Barium tablet did lodge at vallecular space - clearing with pudding intake.  Pt  does complain of xerostomia, provided tips to compensate.  SLP reiterated importance of HEP and following Carls' recommendations.  Pt verbalized understanding to information provided.  Reflux Symptom Index given with pt scoring 3/45 - low likelihood of LPR based on index.  Thanks for this referral.   Swallow Evaluation Recommendations Diet Recommendations: Dysphagia 3 (Mechanical Soft);Thin liquid (soft d/t dental extractions) Liquid Administration via: Cup (pt reports he does not use a straw) Compensations: Slow rate;Small sips/bites;Follow solids with liquid Postural Changes and/or Swallow Maneuvers: Seated upright 90 degrees;Upright 30-60 min after meal Oral Care Recommendations: Oral care BID Follow up Recommendations: Outpatient SLP Prognosis Prognosis for Safe Diet Advancement: Fair (as tx initiates) Individuals Consulted Consulted and Agree with Results and Recommendations: Patient;Family member/caregiver Family Member Consulted: spouse [name] Report Sent to : Referring physician;Primary SLP  SLP Assessment/Plan Dysphagia Diagnosis: Within Functional Limits Clinical impression: Pt currently presents with a functional oropharyngeal swallow without aspiration, penetration of any consistency tested.  Barium tablet did lodge at vallecular space - clearing with pudding intake.  Pt does complain of xerostomia, provided tips to compensate.  SLP reiterated importance of HEP and following Carls' recommendations.  Pt verbalized understanding to information provided.  Reflux Symptom Index given with pt scoring 3/45 - low likelihood of LPR based on index.  Thanks for this referral.    General:  Date of Onset: 09/21/11 HPI: Pt is a 61 yo old male diagnosed with  head and neck cancer February 2013, s/p PEG placement *that is now infected per pt*.  Plans are for pt to have chemoradiation- pt reports he is to receive cisplatin.  PMH + for GERD, hyperlipidemia, sleep apnea.  Pt reports he has been treated  by Dr Elnoria Howard for GERD x5 years. Pt reports good compliance with medications.  He denies overt problems swallowing - is consuming soft foods d/t dental extraction last week.  Upon further testing, pt reports he had issues with chronic cough and throat clearing and was subsequently placed on a PPI.  Pt denies pulmonary infections but does acknowledge small amount of weight loss.  Pt presents today with his wife.  He has seen Verdie Mosher at [facility] for dysphagia and has been completing his HEP - minus Shaker head lift.   Type of Study: Modified Barium Swallowing Study Previous Swallow Assessment: BSE completed by Verdie Mosher, documenting concerns for pharyngeal dysphagia Diet Prior to this Study: Dysphagia 3 (soft);Thin liquids Respiratory Status: Room air History of Intubation: No Behavior/Cognition: Alert;Cooperative;Pleasant mood Oral Cavity - Dentition: Adequate natural dentition Oral Motor / Sensory Function: Within functional limits Vision: Functional for self-feeding Patient Positioning: Upright in chair Baseline Vocal Quality: Clear Volitional Cough: Other (Comment) (did not conduct due to pt's PEG site infected) Anatomy: Within functional limits Pharyngeal Secretions: Not observed secondary MBS  Reason for Referral:  Dysphagia secondary to head and neck cancer  Oral Phase Oral Preparation/Oral Phase Oral Phase: WFL Pharyngeal Phase  Pharyngeal Phase Pharyngeal Phase: Impaired Pharyngeal - Solids Pharyngeal - Pill: Pharyngeal residue - valleculae (barium tablet lodged at vallecular space-cleared with puddin) Pharyngeal Phase - Comment Pharyngeal Comment: thin, nectar, pudding, cracker cleared through pharynx without stasis or delay Cervical Esophageal Phase  Cervical Esophageal Phase Cervical Esophageal Phase: Impaired Cervical Esophageal Phase - Comment Cervical Esophageal Comment: appearance of stasis at distal esophagus with pudding/cracker more than liquids and without pt  awareness, liquids facilitated clearance, pt reports plan for him to have an endoscopic evaluation of his esophagus within the next few weeks.    Donavan Burnet, MS [facility] SLP [phone]

## 2011-09-22 ENCOUNTER — Ambulatory Visit
Admission: RE | Admit: 2011-09-22 | Discharge: 2011-09-22 | Disposition: A | Discharge status: Home / Self Care | Payer: Managed Care, Other (non HMO) | Source: Ambulatory Visit | Attending: Radiation Oncology | Admitting: Radiation Oncology

## 2011-09-22 MED ORDER — SODIUM CHLORIDE 0.9 % IJ SOLN
10.0000 mL | Freq: Once | INTRAMUSCULAR | Status: AC
  Administered 2011-09-22: 10 mL via INTRAVENOUS

## 2011-09-22 NOTE — Progress Notes (Signed)
Patient presented to the clinic today accompanied by his wife for an IV start necessary for CT/SIM. Patient is alert and oriented to person, place, and time. No distress noted. Steady gait noted. Pleasant affect noted. Patient denies pain at this time. Provided patient with and reviewed patient calendar. Patient verbalized understanding. Started a 22 gauge IV in the patient's left AC on the first attempt. Site flushed without difficulty and provided excellent blood return. Patient tolerated well. IV secured with Tegaderm dressing. Patient escorted to CT.

## 2011-09-22 NOTE — Progress Notes (Signed)
D/C Iv site on left antecubital, placed 2 2x2 gause and taped, no bleeding noted,  instructed to hold arm up for 1 minute, keep dressing on for 2-3 hours,if when removing site startes to bleed reapply pressure for longertime, pt gave verbal understanding, no c/o pain 10:58 AM'

## 2011-09-22 NOTE — Progress Notes (Signed)
Met with patient and spouse to discuss RO billing.  Had no concerns today.

## 2011-09-23 ENCOUNTER — Other Ambulatory Visit (HOSPITAL_COMMUNITY): Payer: Self-pay | Admitting: Physician Assistant

## 2011-09-23 ENCOUNTER — Encounter (INDEPENDENT_AMBULATORY_CARE_PROVIDER_SITE_OTHER): Payer: Self-pay | Admitting: General Surgery

## 2011-09-23 ENCOUNTER — Telehealth: Payer: Self-pay | Admitting: *Deleted

## 2011-09-23 ENCOUNTER — Other Ambulatory Visit: Payer: Self-pay | Admitting: Oncology

## 2011-09-23 ENCOUNTER — Other Ambulatory Visit (INDEPENDENT_AMBULATORY_CARE_PROVIDER_SITE_OTHER): Payer: Self-pay | Admitting: General Surgery

## 2011-09-23 ENCOUNTER — Inpatient Hospital Stay (HOSPITAL_COMMUNITY)
Admission: RE | Admit: 2011-09-23 | Discharge: 2011-09-27 | DRG: 394 | Disposition: A | Discharge status: Home / Self Care | Payer: Managed Care, Other (non HMO) | Source: Ambulatory Visit | Attending: General Surgery | Admitting: General Surgery

## 2011-09-23 VITALS — BP 126/84 | HR 80 | Temp 98.0°F | Resp 18 | Ht 73.0 in | Wt 275.1 lb

## 2011-09-23 DIAGNOSIS — Z87891 Personal history of nicotine dependence: Secondary | ICD-10-CM

## 2011-09-23 DIAGNOSIS — R509 Fever, unspecified: Secondary | ICD-10-CM

## 2011-09-23 DIAGNOSIS — K9423 Gastrostomy malfunction: Principal | ICD-10-CM | POA: Diagnosis present

## 2011-09-23 DIAGNOSIS — K9420 Gastrostomy complication, unspecified: Secondary | ICD-10-CM

## 2011-09-23 DIAGNOSIS — C801 Malignant (primary) neoplasm, unspecified: Secondary | ICD-10-CM | POA: Diagnosis present

## 2011-09-23 DIAGNOSIS — Z79899 Other long term (current) drug therapy: Secondary | ICD-10-CM

## 2011-09-23 DIAGNOSIS — L02219 Cutaneous abscess of trunk, unspecified: Secondary | ICD-10-CM | POA: Diagnosis present

## 2011-09-23 DIAGNOSIS — H9190 Unspecified hearing loss, unspecified ear: Secondary | ICD-10-CM | POA: Diagnosis present

## 2011-09-23 DIAGNOSIS — Z860100 Personal history of colon polyps, unspecified: Secondary | ICD-10-CM

## 2011-09-23 DIAGNOSIS — Z7982 Long term (current) use of aspirin: Secondary | ICD-10-CM

## 2011-09-23 DIAGNOSIS — E785 Hyperlipidemia, unspecified: Secondary | ICD-10-CM | POA: Diagnosis present

## 2011-09-23 DIAGNOSIS — Y833 Surgical operation with formation of external stoma as the cause of abnormal reaction of the patient, or of later complication, without mention of misadventure at the time of the procedure: Secondary | ICD-10-CM | POA: Diagnosis present

## 2011-09-23 DIAGNOSIS — C760 Malignant neoplasm of head, face and neck: Secondary | ICD-10-CM | POA: Insufficient documentation

## 2011-09-23 DIAGNOSIS — G4733 Obstructive sleep apnea (adult) (pediatric): Secondary | ICD-10-CM | POA: Diagnosis present

## 2011-09-23 DIAGNOSIS — K9422 Gastrostomy infection: Secondary | ICD-10-CM

## 2011-09-23 DIAGNOSIS — N400 Enlarged prostate without lower urinary tract symptoms: Secondary | ICD-10-CM | POA: Diagnosis present

## 2011-09-23 DIAGNOSIS — K219 Gastro-esophageal reflux disease without esophagitis: Secondary | ICD-10-CM | POA: Diagnosis present

## 2011-09-23 DIAGNOSIS — C770 Secondary and unspecified malignant neoplasm of lymph nodes of head, face and neck: Secondary | ICD-10-CM | POA: Insufficient documentation

## 2011-09-23 MED ORDER — PIPERACILLIN-TAZOBACTAM 3.375 G IVPB
3.3750 g | Freq: Three times a day (TID) | INTRAVENOUS | Status: DC
  Administered 2011-09-23 – 2011-09-26 (×8): 3.375 g via INTRAVENOUS
  Filled 2011-09-23 (×10): qty 50

## 2011-09-23 MED ORDER — DIPHENHYDRAMINE HCL 12.5 MG/5ML PO ELIX
12.5000 mg | ORAL_SOLUTION | Freq: Four times a day (QID) | ORAL | Status: DC | PRN
  Filled 2011-09-23: qty 10

## 2011-09-23 MED ORDER — MORPHINE SULFATE 2 MG/ML IJ SOLN
1.0000 mg | INTRAMUSCULAR | Status: DC | PRN
  Administered 2011-09-24: 2 mg via INTRAVENOUS

## 2011-09-23 MED ORDER — MORPHINE SULFATE 2 MG/ML IJ SOLN
2.0000 mg | Freq: Once | INTRAMUSCULAR | Status: AC
  Administered 2011-09-23: 2 mg via INTRAVENOUS

## 2011-09-23 MED ORDER — ONDANSETRON HCL 4 MG/2ML IJ SOLN
4.0000 mg | Freq: Four times a day (QID) | INTRAMUSCULAR | Status: DC | PRN
  Administered 2011-09-24 – 2011-09-26 (×6): 4 mg via INTRAVENOUS
  Filled 2011-09-23 (×6): qty 2

## 2011-09-23 MED ORDER — DIPHENHYDRAMINE HCL 50 MG/ML IJ SOLN: 12.5000 mg | Freq: Four times a day (QID) | INTRAMUSCULAR | Status: DC | PRN

## 2011-09-23 MED ORDER — HYDROCODONE-ACETAMINOPHEN 5-325 MG PO TABS
1.0000 | ORAL_TABLET | ORAL | Status: DC | PRN
  Administered 2011-09-24 – 2011-09-27 (×7): 2 via ORAL
  Filled 2011-09-23 (×6): qty 2

## 2011-09-23 MED ORDER — SODIUM CHLORIDE 0.9 % IV SOLN
INTRAVENOUS | Status: DC
  Administered 2011-09-23: 13:00:00 via INTRAVENOUS

## 2011-09-23 MED ORDER — PANTOPRAZOLE SODIUM 40 MG IV SOLR
40.0000 mg | Freq: Every day | INTRAVENOUS | Status: DC
  Administered 2011-09-23 – 2011-09-25 (×3): 40 mg via INTRAVENOUS
  Filled 2011-09-23 (×4): qty 40

## 2011-09-23 MED ORDER — TAMSULOSIN HCL 0.4 MG PO CAPS
0.4000 mg | ORAL_CAPSULE | Freq: Every day | ORAL | Status: DC
  Administered 2011-09-23: 0.4 mg via ORAL
  Filled 2011-09-23 (×2): qty 1

## 2011-09-23 MED ORDER — CEFAZOLIN SODIUM 1-5 GM-% IV SOLN
INTRAVENOUS | Status: AC
  Filled 2011-09-23: qty 50

## 2011-09-23 MED ORDER — DOCUSATE SODIUM 100 MG PO CAPS: 100.0000 mg | ORAL_CAPSULE | Freq: Two times a day (BID) | ORAL | Status: DC | PRN

## 2011-09-23 MED ORDER — KCL IN DEXTROSE-NACL 20-5-0.45 MEQ/L-%-% IV SOLN
INTRAVENOUS | Status: DC
  Administered 2011-09-23: 23:00:00 via INTRAVENOUS
  Administered 2011-09-24: 100 mL/h via INTRAVENOUS
  Administered 2011-09-25 – 2011-09-26 (×3): via INTRAVENOUS
  Filled 2011-09-23 (×10): qty 1000

## 2011-09-23 MED ORDER — CEFAZOLIN SODIUM 1-5 GM-% IV SOLN
1.0000 g | Freq: Once | INTRAVENOUS | Status: AC
  Administered 2011-09-23: 1 g via INTRAVENOUS

## 2011-09-23 MED ORDER — MORPHINE SULFATE 4 MG/ML IJ SOLN
INTRAMUSCULAR | Status: AC
  Administered 2011-09-23: 2 mg
  Filled 2011-09-23: qty 1

## 2011-09-23 MED ORDER — ZOLPIDEM TARTRATE 5 MG PO TABS
5.0000 mg | ORAL_TABLET | Freq: Every day | ORAL | Status: DC
  Administered 2011-09-23: 5 mg via ORAL
  Filled 2011-09-23: qty 1

## 2011-09-23 MED ORDER — MORPHINE SULFATE 2 MG/ML IJ SOLN: 2.0000 mg | INTRAMUSCULAR | Status: DC | PRN

## 2011-09-23 NOTE — H&P (Signed)
Steven Robinson Feb 29, 1951  [id number].   Primary Care MD: Dr. Dewain Penning Chief Complaint/Reason for Consult: replace g-tube HPI: This is a 61 yo male who had a PEG placed last Monday by IR.  He has head and neck cancer and got a prophylactic g-tube for complications anticipated after chemo/radiation.  Since he has this tube placed, he developed cellulitis which he was given Keflex for.  He continued to have severe pain and came in today.  He had a CT scan that showed the mushroom has pulled into the abdominal wall.  We were asked to see to consider an open g-tube be placed.  Review of Systems: Please see HPI, otherwise all other systems are negative.  Family History  Problem Relation Age of Onset  . Alzheimer's disease Mother   . Tics Mother   . Hypertension Father   . Cancer Father 41    Prostate  . Stroke Paternal Grandmother   . Cancer Paternal Grandfather     Lung  and Prostate    Past Medical History  Diagnosis Date  . Hyperlipoproteinemia   . Obstructive sleep apnea     on C-pap.   [name] GERD (gastroesophageal reflux disease)     chronic  . Hx of colonic polyps   . 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 Vertigo     2/2 intersitial neuritis.   [name] Hearing loss of left ear   . BPH (benign prostatic hyperplasia)   . 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 Hernia repair 1990    Left  . Fine needle aspiration 08/26/11     LEFT NECK FNA, LYMPH NODE, METASTATIC SQUAMOUS CELL CARCINOMA    Social History:  reports that he quit smoking about 39 years ago. He does not have any smokeless tobacco history on file. He reports that he does not drink alcohol or use illicit drugs.  Allergies:  Allergies  Allergen Reactions  . Cyclobenzaprine Hcl     confusion    Medications Prior to Admission  Medication Sig Dispense Refill  . ALPRAZolam (XANAX) 0.5 MG tablet Take by mouth as needed.       [name] aspirin 81 MG tablet Take 81 mg by mouth  daily.      . diphenhydrAMINE (BENADRYL) 25 MG tablet Take 25 mg by mouth at bedtime as needed.       [name] DYMISTA 137-50 MCG/ACT SUSP Place into the nose as needed.       [name] HYDROcodone-acetaminophen (LORTAB) 7.5-500 MG/15ML solution Take 15 mLs by mouth every 6 (six) hours as needed for pain.  500 mL  0  . LORazepam (ATIVAN) 0.5 MG tablet Take 1 tablet (0.5 mg total) by mouth every 6 (six) hours as needed (Nausea or vomiting).  30 tablet  0  . omega-3 acid ethyl esters (LOVAZA) 1 G capsule Take 4 g by mouth Nightly.       . ondansetron (ZOFRAN) 8 MG tablet Take 1 tablet two times a day as needed for nausea or vomiting starting on the third day after chemotherapy.  30 tablet  1  . prochlorperazine (COMPAZINE) 10 MG tablet Take 1 tablet (10 mg total) by mouth every 6 (six) hours as needed (Nausea or vomiting).  30 tablet  1  . rosuvastatin (CRESTOR) 10 MG tablet Take 40 mg by mouth daily.       . Tamsulosin HCl (FLOMAX) 0.4 MG CAPS Take by mouth.      [name]  zolpidem (AMBIEN) 10 MG tablet Take 10 mg by mouth at bedtime as needed.       Medications Prior to Admission  Medication Dose Route Frequency Provider Last Rate Last Dose  . 0.9 %  sodium chloride infusion   Intravenous Continuous Simonne Come, MD 50 mL/hr at 09/23/11 1300    . ceFAZolin (ANCEF) 1-5 GM-% IVPB           . ceFAZolin (ANCEF) IVPB 1 g/50 mL premix  1 g Intravenous Once Simonne Come, MD   1 g at 09/23/11 1310  . morphine 2 MG/ML injection 2 mg  2 mg Intravenous Once Michael Litter, PA   2 mg at 09/23/11 1238  . morphine 2 MG/ML injection 2 mg  2 mg Intravenous Q1H PRN Simonne Come, MD      . morphine 4 MG/ML injection        2 mg at 09/23/11 1137  . sodium chloride 0.9 % injection 10 mL  10 mL Intravenous Once Lonie Peak, MD   10 mL at 09/22/11 1059    There were no vitals taken for this visit. Physical Exam: General: pleasant, WD, WN white male who is laying in bed in NAD HEENT: head is normocephalic, atraumatic.  Sclera are  noninjected.  PERRL.  Ears and nose without any masses or lesions.  Mouth is pink and moist Heart: regular, rate, and rhythm.  Normal s1,s2. No obvious murmurs, gallops, or rubs noted.  Palpable radial and pedal pulses bilaterally Lungs: CTAB, no wheezes, rhonchi, or rales noted.  Respiratory effort nonlabored Abd: soft, tender in upper abdomen, especially around g-tube site, ND, +BS, no masses, hernias, or organomegaly.  He does have some remaining cellulitic changes in the upper abdomen and just below his umbilicus, but these are mild.  He does have some slight erythema around his g-tube. Psych: A&Ox3 with an appropriate affect.    No results found for this or any previous visit (from the past 48 hour(s)). Ct Abdomen Wo Contrast  09/23/2011  *RADIOLOGY REPORT*  Clinical Data:  G tube placement 09/13/2011.  Pain at insertion site.  Evaluate tube position and possible abscess.  CT ABDOMEN WITHOUT CONTRAST  Technique:  Multidetector CT imaging of the abdomen was performed following the standard protocol without IV contrast.  Comparison:  Chest CT 06/25/2004.  Findings:  The retention balloon for the percutaneous gastrostomy is positioned within the left anterior abdominal wall within the rectus abdominus muscle.  This appears to be retracted from the lumen of the stomach by approximately 2.4 cm.  There is an air tract extending from the tube into the gastric lumen.  There is subcutaneous edema surrounding the tube within the anterior abdominal wall.  No focal fluid collection is identified. There is no free intraperitoneal air.  The lung bases are clear.  There is no pleural effusion.  As evaluated in the noncontrast state, the liver, spleen, gallbladder, pancreas, adrenal glands and kidneys appear normal.  IMPRESSION:  1.  Percutaneous G tube has withdrawn from the lumen of the stomach and is positioned within the anterior abdominal wall.  Tube repositioning is recommended. 2.  Edema within the  subcutaneous fat surrounding the tube may represent mild cellulitis.  No evidence of abscess or free intraperitoneal air.  Drs. Watts and Reunion are aware of these findings.  Original Report Authenticated By: Gerrianne Scale, M.D.       Assessment/Plan 1. Dislodged PEG tube with cellulitis 2. Head and neck cancer 3. OSA  Plan: 1. We will plan to admit the patient and do a surgical g-tube placement tomorrow for the patient.  We will place him on IV abx to continue to treat his cellulitis.  He can eat tonight and then NPO after MN. 2. Home CPAP use 3. Continue ambien and flomax from home.  Cristina Ceniceros E 09/23/2011, 4:09 PM

## 2011-09-23 NOTE — Progress Notes (Signed)
Patient presents today for follow up evaluation of his gastrostomy tube.  Seen earlier in the week with pain, purulent drainage and erythema.  He was placed on keflex 500mg  qid x 10 days of which he has been compliant in taking so far.  He denies fever at home. He has had normal BM's, positive for flatus.  Pain medication minimally reduces his pain.  Examination today shows that he has significant distress when moving affects his abdominal muscles.  Less erythema is noted, warmth is completely resolved and improved erythema at the insertion site with less purulence noted.  Serous drainage is seen slightly oozing during exam.  Two small areas from abrasion from the bumper are scabbing.  He has mild tenderness when moving the tube - but significant pain with palpation around the area.  There is a firmness felt - mostly to the left of his gastric tube worrisome for underlying hematoma vs abscess and the possibility that g-tube is malpositioned.  Will have CT performed to evaluate - patient and spouse in agreement.   Addendum : CT reviewed with Dr. Katherina Right,  Which reveals the gastric tube is out of the stomach and is within the muscle.  It appears a patent tract remains.  No significant findings to suggest hematoma or abscess.  Talk held with patient and spouse in regards to removal of g-tube with replacement with another gastric tube; specifically balloon retention.  Concern is of course would this be effective given the breakdown present of the skin surrounding the stomach. Given that a balloon retention would be optimal for replacement,  also has concerns as they are more susceptible to easily being dislodged.  Peritonitis is a concern if gastric tube removed with an immature tract.  Will ask surgery to consult for their opinion and will allow the patient to be educated on the best possible treatment course at this time.   ---------------------------------------------------------------- Agree with  above. Following evaluation by surgical PA, the patient and his wife have made the decision to proceed with definitive surgical repair.   I feel this is a wise decision before the patient embarks on chemo/radiation beginning next week as I am unsure the balloon retention gastrostomy tube will remain secure within the gastric lumen (as the disc gastrostomy tube has failed and are typically the more secure feeding tube) and if the balloon will be large enough to prevent leakage of gastric contents. Patient is to be admitted by surgery and undergo surgical revision of the gastrostomy tube 4/12.   The surgical team's timely assistance is very much appreciated in the care of this very pleasant patient.

## 2011-09-23 NOTE — Telephone Encounter (Signed)
Pt called from [facility] Radiology dept.  States waiting to find out plan for replacing feeding tube.  He wants Dr. Lodema Pilot opinion.  Per Dr. Gaylyn Rong, pt to follow Radiologist and Surgeon's opinion for feeding tube at this point and states ok to delay chemo if needed to allow Feeding tube to heal.  Informed pt that chemo is going to be scheduled to start same week as Radiation at this point, but can be delayed if needed.  He verbalized understanding.

## 2011-09-24 ENCOUNTER — Encounter (HOSPITAL_COMMUNITY): Payer: Self-pay | Admitting: Anesthesiology

## 2011-09-24 ENCOUNTER — Encounter (HOSPITAL_COMMUNITY): Payer: Self-pay

## 2011-09-24 ENCOUNTER — Other Ambulatory Visit: Payer: Self-pay | Admitting: Otolaryngology

## 2011-09-24 ENCOUNTER — Inpatient Hospital Stay (HOSPITAL_COMMUNITY): Payer: Managed Care, Other (non HMO) | Admitting: Anesthesiology

## 2011-09-24 ENCOUNTER — Telehealth: Payer: Self-pay | Admitting: Oncology

## 2011-09-24 ENCOUNTER — Encounter (HOSPITAL_COMMUNITY): Admission: RE | Disposition: A | Discharge status: Home / Self Care | Payer: Self-pay | Source: Ambulatory Visit

## 2011-09-24 DIAGNOSIS — C770 Secondary and unspecified malignant neoplasm of lymph nodes of head, face and neck: Secondary | ICD-10-CM | POA: Insufficient documentation

## 2011-09-24 HISTORY — PX: RIGID ESOPHAGOSCOPY: SHX5226

## 2011-09-24 HISTORY — PX: GASTROSTOMY: SHX5249

## 2011-09-24 LAB — BASIC METABOLIC PANEL
CO2: 27 mEq/L (ref 19–32)
Calcium: 9.1 mg/dL (ref 8.4–10.5)
Chloride: 101 mEq/L (ref 96–112)
Glucose, Bld: 102 mg/dL — ABNORMAL HIGH (ref 70–99)
Sodium: 138 mEq/L (ref 135–145)

## 2011-09-24 LAB — CBC
HCT: 37.5 % — ABNORMAL LOW (ref 39.0–52.0)
Hemoglobin: 12.8 g/dL — ABNORMAL LOW (ref 13.0–17.0)
MCH: 29.8 pg (ref 26.0–34.0)
MCV: 87.2 fL (ref 78.0–100.0)
Platelets: 274 10*3/uL (ref 150–400)
RBC: 4.3 MIL/uL (ref 4.22–5.81)

## 2011-09-24 SURGERY — INSERTION OF GASTROSTOMY TUBE
Anesthesia: General | Site: Throat | Wound class: Dirty or Infected

## 2011-09-24 MED ORDER — HYDROMORPHONE HCL PF 1 MG/ML IJ SOLN
INTRAMUSCULAR | Status: AC
  Administered 2011-09-24: 0.5 mg via INTRAVENOUS
  Filled 2011-09-24: qty 1

## 2011-09-24 MED ORDER — LACTATED RINGERS IV SOLN
INTRAVENOUS | Status: DC | PRN
  Administered 2011-09-24 (×2): via INTRAVENOUS

## 2011-09-24 MED ORDER — SUCCINYLCHOLINE CHLORIDE 20 MG/ML IJ SOLN
INTRAMUSCULAR | Status: DC | PRN
  Administered 2011-09-24: 140 mg via INTRAVENOUS

## 2011-09-24 MED ORDER — ROCURONIUM BROMIDE 100 MG/10ML IV SOLN
INTRAVENOUS | Status: DC | PRN
  Administered 2011-09-24: 20 mg via INTRAVENOUS
  Administered 2011-09-24: 30 mg via INTRAVENOUS
  Administered 2011-09-24 (×2): 20 mg via INTRAVENOUS

## 2011-09-24 MED ORDER — LACTATED RINGERS IV SOLN: INTRAVENOUS | Status: DC

## 2011-09-24 MED ORDER — HYDROMORPHONE HCL PF 1 MG/ML IJ SOLN
0.2500 mg | INTRAMUSCULAR | Status: DC | PRN
  Administered 2011-09-24: 0.5 mg via INTRAVENOUS

## 2011-09-24 MED ORDER — FENTANYL CITRATE 0.05 MG/ML IJ SOLN
INTRAMUSCULAR | Status: AC
  Filled 2011-09-24: qty 2

## 2011-09-24 MED ORDER — NEOSTIGMINE METHYLSULFATE 1 MG/ML IJ SOLN
INTRAMUSCULAR | Status: DC | PRN
  Administered 2011-09-24: 4 mg via INTRAVENOUS

## 2011-09-24 MED ORDER — ZOLPIDEM TARTRATE 5 MG PO TABS
10.0000 mg | ORAL_TABLET | Freq: Every evening | ORAL | Status: DC | PRN
  Administered 2011-09-24 – 2011-09-26 (×3): 10 mg via ORAL
  Filled 2011-09-24 (×3): qty 2

## 2011-09-24 MED ORDER — HYDROCODONE-ACETAMINOPHEN 5-325 MG PO TABS
ORAL_TABLET | ORAL | Status: AC
  Filled 2011-09-24: qty 2

## 2011-09-24 MED ORDER — MIDAZOLAM HCL 5 MG/5ML IJ SOLN
INTRAMUSCULAR | Status: DC | PRN
  Administered 2011-09-24: 2 mg via INTRAVENOUS

## 2011-09-24 MED ORDER — FENTANYL CITRATE 0.05 MG/ML IJ SOLN
INTRAMUSCULAR | Status: DC | PRN
  Administered 2011-09-24: 150 ug via INTRAVENOUS
  Administered 2011-09-24: 100 ug via INTRAVENOUS

## 2011-09-24 MED ORDER — 0.9 % SODIUM CHLORIDE (POUR BTL) OPTIME
TOPICAL | Status: DC | PRN
  Administered 2011-09-24: 1000 mL

## 2011-09-24 MED ORDER — GLYCOPYRROLATE 0.2 MG/ML IJ SOLN
INTRAMUSCULAR | Status: DC | PRN
  Administered 2011-09-24: .5 mg via INTRAVENOUS

## 2011-09-24 MED ORDER — HYDROMORPHONE HCL PF 1 MG/ML IJ SOLN
1.0000 mg | INTRAMUSCULAR | Status: DC | PRN
  Administered 2011-09-24: 1 mg via INTRAVENOUS
  Administered 2011-09-24: 2 mg via INTRAVENOUS
  Administered 2011-09-24: 1 mg via INTRAVENOUS
  Administered 2011-09-25: 2 mg via INTRAVENOUS
  Administered 2011-09-25: 1 mg via INTRAVENOUS
  Administered 2011-09-25 – 2011-09-26 (×5): 2 mg via INTRAVENOUS
  Administered 2011-09-26 (×2): 1 mg via INTRAVENOUS
  Administered 2011-09-26: 2 mg via INTRAVENOUS
  Filled 2011-09-24 (×5): qty 2
  Filled 2011-09-24: qty 1
  Filled 2011-09-24 (×2): qty 2
  Filled 2011-09-24: qty 1
  Filled 2011-09-24 (×3): qty 2

## 2011-09-24 MED ORDER — HYDROMORPHONE HCL PF 1 MG/ML IJ SOLN
0.5000 mg | INTRAMUSCULAR | Status: DC | PRN
  Administered 2011-09-24: 0.5 mg via INTRAVENOUS

## 2011-09-24 MED ORDER — ACETAMINOPHEN 10 MG/ML IV SOLN
INTRAVENOUS | Status: DC | PRN
  Administered 2011-09-24: 1000 mg via INTRAVENOUS

## 2011-09-24 MED ORDER — MORPHINE SULFATE 2 MG/ML IJ SOLN
INTRAMUSCULAR | Status: AC
  Filled 2011-09-24: qty 1

## 2011-09-24 MED ORDER — PROPOFOL 10 MG/ML IV EMUL
INTRAVENOUS | Status: DC | PRN
  Administered 2011-09-24: 200 mg via INTRAVENOUS

## 2011-09-24 MED ORDER — FENTANYL CITRATE 0.05 MG/ML IJ SOLN
25.0000 ug | INTRAMUSCULAR | Status: DC | PRN
  Administered 2011-09-24 (×3): 50 ug via INTRAVENOUS

## 2011-09-24 MED ORDER — ONDANSETRON HCL 4 MG/2ML IJ SOLN
INTRAMUSCULAR | Status: DC | PRN
  Administered 2011-09-24: 4 mg via INTRAVENOUS

## 2011-09-24 MED ORDER — FENTANYL CITRATE 0.05 MG/ML IJ SOLN
50.0000 ug | Freq: Once | INTRAMUSCULAR | Status: AC
  Administered 2011-09-24: 50 ug via INTRAVENOUS

## 2011-09-24 MED ORDER — FENTANYL CITRATE 0.05 MG/ML IJ SOLN
INTRAMUSCULAR | Status: AC
  Administered 2011-09-24: 50 ug via INTRAVENOUS
  Filled 2011-09-24: qty 2

## 2011-09-24 MED ORDER — ACETAMINOPHEN 10 MG/ML IV SOLN
INTRAVENOUS | Status: AC
  Filled 2011-09-24: qty 100

## 2011-09-24 SURGICAL SUPPLY — 69 items
BAG URINE DRAINAGE (UROLOGICAL SUPPLIES) ×1 IMPLANT
BALLN PULM 15 16.5 18X75 (BALLOONS) ×3
BALLOON PULM 15 16.5 18X75 (BALLOONS) ×2 IMPLANT
BLADE SURG 15 STRL LF DISP TIS (BLADE) IMPLANT
BLADE SURG 15 STRL SS (BLADE)
CANISTER SUCTION 2500CC (MISCELLANEOUS) ×6 IMPLANT
CATH MALECOT BARD  24FR (CATHETERS) ×1
CATH MALECOT BARD 24FR (CATHETERS) IMPLANT
CHLORAPREP W/TINT 26ML (MISCELLANEOUS) ×3 IMPLANT
CLOTH BEACON ORANGE TIMEOUT ST (SAFETY) ×6 IMPLANT
CONT SPEC 4OZ CLIKSEAL STRL BL (MISCELLANEOUS) ×2 IMPLANT
COVER SURGICAL LIGHT HANDLE (MISCELLANEOUS) ×3 IMPLANT
COVER TABLE BACK 60X90 (DRAPES) ×3 IMPLANT
CRADLE DONUT ADULT HEAD (MISCELLANEOUS) IMPLANT
DRAPE LAPAROSCOPIC ABDOMINAL (DRAPES) ×3 IMPLANT
DRAPE PROXIMA HALF (DRAPES) ×3 IMPLANT
DRAPE UTILITY 15X26 W/TAPE STR (DRAPE) ×6 IMPLANT
ELECT CAUTERY BLADE 6.4 (BLADE) ×3 IMPLANT
ELECT COATED BLADE 2.86 ST (ELECTRODE) IMPLANT
ELECT REM PT RETURN 9FT ADLT (ELECTROSURGICAL) ×3
ELECTRODE REM PT RTRN 9FT ADLT (ELECTROSURGICAL) ×2 IMPLANT
GAUZE PACKING IODOFORM 1/4X5 (PACKING) ×1 IMPLANT
GLOVE BIO SURGEON STRL SZ7.5 (GLOVE) ×1 IMPLANT
GLOVE BIO SURGEON STRL SZ8 (GLOVE) ×6 IMPLANT
GLOVE BIOGEL PI IND STRL 7.0 (GLOVE) IMPLANT
GLOVE BIOGEL PI IND STRL 7.5 (GLOVE) IMPLANT
GLOVE BIOGEL PI IND STRL 8 (GLOVE) ×2 IMPLANT
GLOVE BIOGEL PI INDICATOR 7.0 (GLOVE) ×2
GLOVE BIOGEL PI INDICATOR 7.5 (GLOVE) ×2
GLOVE BIOGEL PI INDICATOR 8 (GLOVE) ×2
GLOVE ECLIPSE 7.5 STRL STRAW (GLOVE) ×3 IMPLANT
GLOVE ECLIPSE 8.0 STRL XLNG CF (GLOVE) ×1 IMPLANT
GLOVE SURG SS PI 6.5 STRL IVOR (GLOVE) ×2 IMPLANT
GOWN PREVENTION PLUS XLARGE (GOWN DISPOSABLE) ×4 IMPLANT
GOWN STRL NON-REIN LRG LVL3 (GOWN DISPOSABLE) ×5 IMPLANT
GUARD TEETH (MISCELLANEOUS) IMPLANT
KIT BASIN OR (CUSTOM PROCEDURE TRAY) ×3 IMPLANT
KIT ROOM TURNOVER OR (KITS) ×6 IMPLANT
MARKER SKIN DUAL TIP RULER LAB (MISCELLANEOUS) IMPLANT
NEEDLE 22X1 1/2 (OR ONLY) (NEEDLE) IMPLANT
NS IRRIG 1000ML POUR BTL (IV SOLUTION) ×6 IMPLANT
PACK GENERAL/GYN (CUSTOM PROCEDURE TRAY) ×3 IMPLANT
PAD ARMBOARD 7.5X6 YLW CONV (MISCELLANEOUS) ×12 IMPLANT
PATTIES SURGICAL .5 X3 (DISPOSABLE) IMPLANT
PENCIL FOOT CONTROL (ELECTRODE) IMPLANT
PLUG CATH AND CAP STER (CATHETERS) IMPLANT
SPECIMEN JAR SMALL (MISCELLANEOUS) ×2 IMPLANT
SPONGE GAUZE 4X4 12PLY (GAUZE/BANDAGES/DRESSINGS) ×5 IMPLANT
SPONGE INTESTINAL PEANUT (DISPOSABLE) IMPLANT
SPONGE TONSIL 1 RF SGL (DISPOSABLE) IMPLANT
STAPLER VISISTAT 35W (STAPLE) ×3 IMPLANT
SUCTION POOLE TIP (SUCTIONS) IMPLANT
SURGILUBE 2OZ TUBE FLIPTOP (MISCELLANEOUS) ×3 IMPLANT
SUT PDS AB 1 TP1 54 (SUTURE) ×1 IMPLANT
SUT SILK 2 0 (SUTURE) ×3
SUT SILK 2 0 SH (SUTURE) ×2 IMPLANT
SUT SILK 2 0 SH CR/8 (SUTURE) ×3 IMPLANT
SUT SILK 2-0 18XBRD TIE 12 (SUTURE) ×2 IMPLANT
SUT SILK 3 0 (SUTURE) ×3
SUT SILK 3-0 18XBRD TIE 12 (SUTURE) ×2 IMPLANT
SYR CONTROL 10ML LL (SYRINGE) IMPLANT
SYR INFLATE BILIARY GAUGE (MISCELLANEOUS) ×3 IMPLANT
SYRINGE TOOMEY DISP (SYRINGE) ×4 IMPLANT
TAPE CLOTH SURG 4X10 WHT LF (GAUZE/BANDAGES/DRESSINGS) ×1 IMPLANT
TOWEL OR 17X24 6PK STRL BLUE (TOWEL DISPOSABLE) ×8 IMPLANT
TOWEL OR 17X26 10 PK STRL BLUE (TOWEL DISPOSABLE) ×3 IMPLANT
TRAP SPECIMEN MUCOUS 40CC (MISCELLANEOUS) IMPLANT
TUBE CONNECTING 12X1/4 (SUCTIONS) ×3 IMPLANT
WATER STERILE IRR 1000ML POUR (IV SOLUTION) ×4 IMPLANT

## 2011-09-24 NOTE — Anesthesia Postprocedure Evaluation (Signed)
  Anesthesia Post-op Note  Patient: Steven Robinson  Procedure(s) Performed: Procedure(s) (LRB): GASTROSTOMY (N/A) RIGID ESOPHAGOSCOPY (N/A)  Patient Location: PACU  Anesthesia Type: General  Level of Consciousness: awake, alert  and oriented  Airway and Oxygen Therapy: Patient Spontanous Breathing and Patient connected to nasal cannula oxygen  Post-op Pain: mild  Post-op Assessment: Post-op Vital signs reviewed, Patient's Cardiovascular Status Stable, Respiratory Function Stable, Patent Airway, No signs of Nausea or vomiting and Pain level controlled  Post-op Vital Signs: Reviewed and stable  Complications: No apparent anesthesia complications

## 2011-09-24 NOTE — Anesthesia Preprocedure Evaluation (Addendum)
Anesthesia Evaluation  Patient identified by MRN, date of birth, ID band Patient awake    Reviewed: Allergy & Precautions, H&P , NPO status , Patient's Chart, lab work & pertinent test results  History of Anesthesia Complications Negative for: history of anesthetic complications  Airway Mallampati: II TM Distance: >3 FB Neck ROM: Full    Dental  (+) Teeth Intact, Dental Advisory Given and Caps   Pulmonary sleep apnea and Continuous Positive Airway Pressure Ventilation , former smoker (remote smoking history) breath sounds clear to auscultation  Pulmonary exam normal       Cardiovascular negative cardio ROS  Rhythm:Regular Rate:Normal     Neuro/Psych negative neurological ROS  negative psych ROS   GI/Hepatic Neg liver ROS, GERD-  Medicated and Controlled,  Endo/Other  Morbid obesity  Renal/GU negative Renal ROS  negative genitourinary   Musculoskeletal negative musculoskeletal ROS (+)   Abdominal (+) + obese,   Peds  Hematology negative hematology ROS (+)   Anesthesia Other Findings   Reproductive/Obstetrics                         Anesthesia Physical Anesthesia Plan  ASA: III  Anesthesia Plan: General   Post-op Pain Management:    Induction: Intravenous  Airway Management Planned: Oral ETT  Additional Equipment:   Intra-op Plan:   Post-operative Plan: Extubation in OR  Informed Consent: I have reviewed the patients History and Physical, chart, labs and discussed the procedure including the risks, benefits and alternatives for the proposed anesthesia with the patient or authorized representative who has indicated his/her understanding and acceptance.   Dental advisory given  Plan Discussed with: CRNA, Anesthesiologist and Surgeon  Anesthesia Plan Comments: (Plan routine monitors, GETA)       Anesthesia Quick Evaluation

## 2011-09-24 NOTE — Anesthesia Procedure Notes (Signed)
Procedure Name: Intubation Date/Time: 09/24/2011 11:49 AM Performed by: Nicholos Johns Pre-anesthesia Checklist: Patient identified, Emergency Drugs available, Suction available, Patient being monitored and Timeout performed Patient Re-evaluated:Patient Re-evaluated prior to inductionOxygen Delivery Method: Circle system utilized Preoxygenation: Pre-oxygenation with 100% oxygen Intubation Type: IV induction Ventilation: Mask ventilation without difficulty Grade View: Grade I Tube type: Oral Tube size: 7.5 mm Number of attempts: 1 Airway Equipment and Method: Stylet Placement Confirmation: ETT inserted through vocal cords under direct vision,  positive ETCO2 and breath sounds checked- equal and bilateral Secured at: 22 cm Tube secured with: Tape Dental Injury: Teeth and Oropharynx as per pre-operative assessment

## 2011-09-24 NOTE — Telephone Encounter (Signed)
called pt and informed him of appts on 04/15-04/22

## 2011-09-24 NOTE — Op Note (Signed)
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 He is here for evaluation of his G-tube in under general anesthesia so after discussion with the family it was elected that he needs to have an esophagoscopy with no known primary given the fact that he is having some swallowing difficulty based on his speech therapy evaluation. He was informed risks, benefits, and options and all questions are answered and consent was obtained.  Operation: Patient taken the operating room placed in supine position after general endotracheal tube anesthesia was examined with the cervical off of the scope. This was passed into the cricopharyngeus area very easily and the muscle was easily passed through with the scope which was placed to its maximum extent into the esophagus with ease. Suctioning of some reflux. The scope was removed and as removed was withdrawn the mucosa was carefully evaluated and there was no evidence of any change in the mucosa or evidence of any areas of cancer. The anesthesia direct laryngoscopy scope was then used to again examined his pharynx which did not show any new or different appearing mucosa as evidence of a primary. Palpation also did not reveal any evidence of mass. He was then turned over to Dr. Janee Morn for his G-tube repair.

## 2011-09-24 NOTE — Progress Notes (Signed)
Simulation IMRT treatment planning and special treatment procedure note  Diagnosis head and neck cancer of unknown primary  The patient was taken to the CT simulator and laid in the supine position on the table. An Aquaplast head and shoulder mask was custom fitted to his anatomy. High-resolution CT axial imaging was obtained of his head and neck with contrast. I verify that the quality of the imaging is good for treatment planning.  Treatment planning note I plan to treat the patient with helical Tomotherapy, IMRT. I plan to treat the patient's mucosal axis including the nasopharynx and oropharynx. Plan to treat the bilateral neck nodes including the bilateral retropharyngeal nodes and levels 1b through 5.   IMRT planning Note IMRT is an important modality to deliver adequate dose to the patient's at risk tissues while sparing the patient's normal structures, including the:  Parotid tissue, mandible, brain stem, spinal cord, oral cavity, larynx, brachial plexus  . This justifies the use of IMRT in the patient's treatment.   Special Treatment Procedure Note: The patient will be receiving chemotherapy concurrently. Chemotherapy heightens the risk of side effects. I have considered this during the patient's treatment planning process and will monitor the patient accordingly for side effects on a weekly basis. Concurrent chemotherapy increases the complexity of this patient's treatment and therefore this constitutes a special treatment procedure.

## 2011-09-24 NOTE — Progress Notes (Signed)
Encounter addended by: Lonie Peak, MD on: 09/24/2011  5:32 PM<BR>     Documentation filed: Notes Section

## 2011-09-24 NOTE — Progress Notes (Signed)
Utilization review completed. Steven Brocious [name]4/05/2012  

## 2011-09-24 NOTE — Op Note (Signed)
09/23/2011 - 09/24/2011  1:17 PM  PATIENT:  Steven Robinson  61 y.o. male  PRE-OPERATIVE DIAGNOSIS:  need for supplemental feeding  POST-OPERATIVE DIAGNOSIS:  need for supplemental feeding  PROCEDURE:  Procedure(s): REMOVAL DISPLACED GASTROSTOMY TUBE, OPEN GASTROSTOMY 24FR MALECOT   SURGEON:  Violeta Gelinas, MD  PHYSICIAN ASSISTANT:   ASSISTANTS: Estelle Grumbles, MD   ANESTHESIA:   general  EBL:  Total I/O In: 1000 [I.V.:1000] Out: 50 [Blood:50]  BLOOD ADMINISTERED:none  DRAINS: none   SPECIMEN:  No Specimen  DISPOSITION OF SPECIMEN:  N/A  COUNTS:  YES  DICTATION: .Dragon Dictation patient underwent placement gastrostomy tube by interventional radiology in preparation for treatment for head and neck squamous carcinoma with unknown primary. His tube became dislodged. He presents for removal of this dislodged tube in place the open gastrostomy. Patient was identified in the preop holding area. He is on antibiotic protocol. Informed consent was obtained. He was taken to the operating room and general endotracheal anesthesia was administered by the anesthesia staff. Dr. Jearld Fenton proceeded with his portion first. That is dictated separately. Next is abdomen was inspected. We did time out procedure. The old dislodged tube was removed and discarded. His abdomen was prepped and draped in sterile fashion. Upper midline incision was made. Subcutaneous tissues were dissected and remaining anterior fascia.s the midline. It was opened the length of the incision. The old G-tube site was noted inside. The stomach was still stuck up to the anterior abdominal wall there. It came away with gentle blunt dissection. The tract and the abdominal wall was then debrided and copiously irrigated. The gastrotomy for that tube was very close to the edge of the stomach. It was closed with interrupted 2-0 silk sutures. Some additional stitches were placed to get hemostasis. The closure was intact. A new location for the  G tube was selected on the abdominal wall with as much distance from the original site as possible. Next a site on the anterior portion of the stomach which reached the abdominal wall site was chosen. 2 concentric pursestring sutures of 2-0 silk were placed there. The 24 French Malecot tube was brought through the abdominal wall at the new site. A small gastrotomy was made in the middle of the concentric pursestring sutures. Malecot tube was inserted without difficulty. The sutures were tied. The tube was withdrawn and the stomach was brought up to the anterior abdominal wall. The stomach was circumferentially tacked to the anterior abdominal wall with 2-0 silk sutures in interrupted fashion. The first 2 were placed using the same pursestring sutures. The tube was then flushed and there was excellent flow and no leakage. The tube was hooked up to a Foley bag for drainage. Abdomen was irrigated. Irrigation fluid returned clear. The gastrotomy repair was rechecked and was intact. Stomach was soft and nontender and no distention. Fascia was closed with 2 lengths of #1 running PDS suture tied in the middle. Subcutaneous tissues were irrigated. Skin was closed with staples. Next the old gastrostomy tube site was further irrigated and then packed with quarter-inch form gauze. Sterile dressings were applied excluding the old G-tube site from the incisional dressings. There were no apparent complications. All counts were correct. Patient tolerated procedure well without apparent palpitations to recovery in stable condition.  PATIENT DISPOSITION:  PACU - hemodynamically stable.   Delay start of Pharmacological VTE agent (>24hrs) due to surgical blood loss or risk of bleeding:  no  Violeta Gelinas, MD, MPH, FACS Pager: [phone]  4/12/20131:17 PM

## 2011-09-24 NOTE — H&P (View-Only) (Signed)
Steven Robinson 11/25/1950  [id number].   Primary Care MD: Dr. Tammy spears Chief Complaint/Reason for Consult: replace g-tube HPI: This is a 61 yo male who had a PEG placed last Monday by IR.  He has head and neck cancer and got a prophylactic g-tube for complications anticipated after chemo/radiation.  Since he has this tube placed, he developed cellulitis which he was given Keflex for.  He continued to have severe pain and came in today.  He had a CT scan that showed the mushroom has pulled into the abdominal wall.  We were asked to see to consider an open g-tube be placed.  Review of Systems: Please see HPI, otherwise all other systems are negative.  Family History  Problem Relation Age of Onset  . Alzheimer's disease Mother   . Tics Mother   . Hypertension Father   . Cancer Father 75    Prostate  . Stroke Paternal Grandmother   . Cancer Paternal Grandfather     Lung  and Prostate    Past Medical History  Diagnosis Date  . Hyperlipoproteinemia   . Obstructive sleep apnea     on C-pap.   . GERD (gastroesophageal reflux disease)     chronic  . Hx of colonic polyps   . 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 Vertigo     2/2 intersitial neuritis.   . Hearing loss of left ear   . BPH (benign prostatic hyperplasia)   . 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 Hernia repair 1990    Left  . Fine needle aspiration 08/26/11     LEFT NECK FNA, LYMPH NODE, METASTATIC SQUAMOUS CELL CARCINOMA    Social History:  reports that he quit smoking about 39 years ago. He does not have any smokeless tobacco history on file. He reports that he does not drink alcohol or use illicit drugs.  Allergies:  Allergies  Allergen Reactions  . Cyclobenzaprine Hcl     confusion    Medications Prior to Admission  Medication Sig Dispense Refill  . ALPRAZolam (XANAX) 0.5 MG tablet Take by mouth as needed.       . aspirin 81 MG tablet Take 81 mg by mouth  daily.      . diphenhydrAMINE (BENADRYL) 25 MG tablet Take 25 mg by mouth at bedtime as needed.       . DYMISTA 137-50 MCG/ACT SUSP Place into the nose as needed.       . HYDROcodone-acetaminophen (LORTAB) 7.5-500 MG/15ML solution Take 15 mLs by mouth every 6 (six) hours as needed for pain.  500 mL  0  . LORazepam (ATIVAN) 0.5 MG tablet Take 1 tablet (0.5 mg total) by mouth every 6 (six) hours as needed (Nausea or vomiting).  30 tablet  0  . omega-3 acid ethyl esters (LOVAZA) 1 G capsule Take 4 g by mouth Nightly.       . ondansetron (ZOFRAN) 8 MG tablet Take 1 tablet two times a day as needed for nausea or vomiting starting on the third day after chemotherapy.  30 tablet  1  . prochlorperazine (COMPAZINE) 10 MG tablet Take 1 tablet (10 mg total) by mouth every 6 (six) hours as needed (Nausea or vomiting).  30 tablet  1  . rosuvastatin (CRESTOR) 10 MG tablet Take 40 mg by mouth daily.       . Tamsulosin HCl (FLOMAX) 0.4 MG CAPS Take by mouth.      .   zolpidem (AMBIEN) 10 MG tablet Take 10 mg by mouth at bedtime as needed.       Medications Prior to Admission  Medication Dose Route Frequency Provider Last Rate Last Dose  . 0.9 %  sodium chloride infusion   Intravenous Continuous John Watts, MD 50 mL/hr at 09/23/11 1300    . ceFAZolin (ANCEF) 1-5 GM-% IVPB           . ceFAZolin (ANCEF) IVPB 1 g/50 mL premix  1 g Intravenous Once John Watts, MD   1 g at 09/23/11 1310  . morphine 2 MG/ML injection 2 mg  2 mg Intravenous Once Pamela Campbell, PA   2 mg at 09/23/11 1238  . morphine 2 MG/ML injection 2 mg  2 mg Intravenous Q1H PRN John Watts, MD      . morphine 4 MG/ML injection        2 mg at 09/23/11 1137  . sodium chloride 0.9 % injection 10 mL  10 mL Intravenous Once [name] Squire, MD   10 mL at 09/22/11 1059    There were no vitals taken for this visit. Physical Exam: General: pleasant, WD, WN white male who is laying in bed in NAD HEENT: head is normocephalic, atraumatic.  Sclera are  noninjected.  PERRL.  Ears and nose without any masses or lesions.  Mouth is pink and moist Heart: regular, rate, and rhythm.  Normal s1,s2. No obvious murmurs, gallops, or rubs noted.  Palpable radial and pedal pulses bilaterally Lungs: CTAB, no wheezes, rhonchi, or rales noted.  Respiratory effort nonlabored Abd: soft, tender in upper abdomen, especially around g-tube site, ND, +BS, no masses, hernias, or organomegaly.  He does have some remaining cellulitic changes in the upper abdomen and just below his umbilicus, but these are mild.  He does have some slight erythema around his g-tube. Psych: A&Ox3 with an appropriate affect.    No results found for this or any previous visit (from the past 48 hour(s)). Ct Abdomen Wo Contrast  09/23/2011  *RADIOLOGY REPORT*  Clinical Data:  G tube placement 09/13/2011.  Pain at insertion site.  Evaluate tube position and possible abscess.  CT ABDOMEN WITHOUT CONTRAST  Technique:  Multidetector CT imaging of the abdomen was performed following the standard protocol without IV contrast.  Comparison:  Chest CT 06/25/2004.  Findings:  The retention balloon for the percutaneous gastrostomy is positioned within the left anterior abdominal wall within the rectus abdominus muscle.  This appears to be retracted from the lumen of the stomach by approximately 2.4 cm.  There is an air tract extending from the tube into the gastric lumen.  There is subcutaneous edema surrounding the tube within the anterior abdominal wall.  No focal fluid collection is identified. There is no free intraperitoneal air.  The lung bases are clear.  There is no pleural effusion.  As evaluated in the noncontrast state, the liver, spleen, gallbladder, pancreas, adrenal glands and kidneys appear normal.  IMPRESSION:  1.  Percutaneous G tube has withdrawn from the lumen of the stomach and is positioned within the anterior abdominal wall.  Tube repositioning is recommended. 2.  Edema within the  subcutaneous fat surrounding the tube may represent mild cellulitis.  No evidence of abscess or free intraperitoneal air.  Drs. Watts and Yamagata are aware of these findings.  Original Report Authenticated By: WILLIAM B. VEAZEY, M.D.       Assessment/Plan 1. Dislodged PEG tube with cellulitis 2. Head and neck cancer 3. OSA    Plan: 1. We will plan to admit the patient and do a surgical g-tube placement tomorrow for the patient.  We will place him on IV abx to continue to treat his cellulitis.  He can eat tonight and then NPO after MN. 2. Home CPAP use 3. Continue ambien and flomax from home.  Makynzie Dobesh E 09/23/2011, 4:09 PM   

## 2011-09-24 NOTE — Progress Notes (Signed)
Subjective: No new complaints  Objective: Vital signs in last 24 hours: Temp:  [97.7 F (36.5 C)-98.3 F (36.8 C)] 98.2 F (36.8 C) (04/12 0630) Pulse Rate:  [58-72] 58  (04/12 0630) Resp:  [17-18] 18  (04/12 0630) BP: (113-138)/(69-91) 120/71 mmHg (04/12 0630) SpO2:  [97 %-100 %] 98 % (04/12 0630) Weight:  [124.785 kg (275 lb 1.6 oz)] 124.785 kg (275 lb 1.6 oz) (04/11 2300)    Intake/Output from previous day: 04/11 0701 - 04/12 0700 In: 314 [I.V.:314] Out: -  Intake/Output this shift:    General appearance: alert and cooperative Resp: clear to auscultation bilaterally GI: G tube site with mild erythema and tan D/C, soft, NT  Lab Results:   [facility] 09/24/11 0450  WBC 8.0  HGB 12.8*  HCT 37.5*  PLT 274   BMET  Basename 09/24/11 0450  NA 138  K 3.7  CL 101  CO2 27  GLUCOSE 102*  BUN 13  CREATININE 0.83  CALCIUM 9.1   PT/INR No results found for this basename: LABPROT:2,INR:2 in the last 72 hours ABG No results found for this basename: PHART:2,PCO2:2,PO2:2,HCO3:2 in the last 72 hours  Studies/Results: Ct Abdomen Wo Contrast  09/23/2011  *RADIOLOGY REPORT*  Clinical Data:  G tube placement 09/13/2011.  Pain at insertion site.  Evaluate tube position and possible abscess.  CT ABDOMEN WITHOUT CONTRAST  Technique:  Multidetector CT imaging of the abdomen was performed following the standard protocol without IV contrast.  Comparison:  Chest CT 06/25/2004.  Findings:  The retention balloon for the percutaneous gastrostomy is positioned within the left anterior abdominal wall within the rectus abdominus muscle.  This appears to be retracted from the lumen of the stomach by approximately 2.4 cm.  There is an air tract extending from the tube into the gastric lumen.  There is subcutaneous edema surrounding the tube within the anterior abdominal wall.  No focal fluid collection is identified. There is no free intraperitoneal air.  The lung bases are clear.  There is no  pleural effusion.  As evaluated in the noncontrast state, the liver, spleen, gallbladder, pancreas, adrenal glands and kidneys appear normal.  IMPRESSION:  1.  Percutaneous G tube has withdrawn from the lumen of the stomach and is positioned within the anterior abdominal wall.  Tube repositioning is recommended. 2.  Edema within the subcutaneous fat surrounding the tube may represent mild cellulitis.  No evidence of abscess or free intraperitoneal air.  Drs. Watts and Reunion are aware of these findings.  Original Report Authenticated By: Gerrianne Scale, M.D.    Anti-infectives: Anti-infectives     Start     Dose/Rate Route Frequency Ordered Stop   09/23/11 2200   piperacillin-tazobactam (ZOSYN) IVPB 3.375 g        3.375 g 12.5 mL/hr over 240 Minutes Intravenous 3 times per day 09/23/11 2125     09/23/11 1310   ceFAZolin (ANCEF) 1-5 GM-% IVPB     Comments: PAYNE, ANGELA: cabinet override         09/23/11 1310 09/24/11 0114   09/23/11 1300   ceFAZolin (ANCEF) IVPB 1 g/50 mL premix        1 g 100 mL/hr over 30 Minutes Intravenous  Once 09/23/11 1259 09/23/11 1340          Assessment/Plan: s/p Procedure(s) (LRB): GASTROSTOMY (N/A) RIGID ESOPHAGOSCOPY (N/A) For open G-tube today and removal of dislodged tube. Dr. Jearld Fenton will also do his planned procedure at that time.  Procedure, risks, benefits,  benefits for my portion were discussed and the patient agrees.  LOS: 1 day    Shannan Slinker E 09/24/2011

## 2011-09-24 NOTE — Interval H&P Note (Signed)
History and Physical Interval Note:  09/24/2011 8:12 AM  Steven Robinson  has presented today for surgery, with the diagnosis of need for supplemental feeding  The various methods of treatment have been discussed with the patient and family. After consideration of risks, benefits and other options for treatment, the patient has consented to  Procedure(s) (LRB): GASTROSTOMY (N/A)  as a surgical intervention .  The patients' history has been reviewed, patient re-examined, no change in status, stable for surgery.  I have reviewed the patients' chart and labs.  Questions were answered to the patient's satisfaction.     Dinisha Cai E

## 2011-09-24 NOTE — Preoperative (Signed)
Beta Blockers   Reason not to administer Beta Blockers:Not Applicable 

## 2011-09-24 NOTE — Transfer of Care (Signed)
Immediate Anesthesia Transfer of Care Note  Patient: Steven Robinson  Procedure(s) Performed: Procedure(s) (LRB): GASTROSTOMY (N/A) RIGID ESOPHAGOSCOPY (N/A)  Patient Location: PACU  Anesthesia Type: General  Level of Consciousness: awake, alert  and oriented  Airway & Oxygen Therapy: Patient Spontanous Breathing and Patient connected to nasal cannula oxygen  Post-op Assessment: Report given to PACU RN and Post -op Vital signs reviewed and stable  Post vital signs: Reviewed and stable  Complications: No apparent anesthesia complications

## 2011-09-25 NOTE — Progress Notes (Signed)
1 Day Post-Op  Subjective: No complaints - pain controlled with pain medication  Objective: Vital signs in last 24 hours: Temp:  [97.5 F (36.4 C)-98.1 F (36.7 C)] 97.9 F (36.6 C) (04/13 1000) Pulse Rate:  [51-84] 79  (04/13 1000) Resp:  [13-22] 18  (04/13 1000) BP: (114-137)/(63-81) 121/69 mmHg (04/13 1000) SpO2:  [90 %-100 %] 97 % (04/13 1000) Last BM Date: 09/23/11  Intake/Output from previous day: 04/12 0701 - 04/13 0700 In: 2311 [P.O.:25; I.V.:2036; IV Piggyback:250] Out: 1900 [Urine:1000; Drains:850; Blood:50] Intake/Output this shift:    General appearance: alert, cooperative and no distress Midline incision - c/d/i; Old gastrostomy site - some surrounding erythema; old bloody drainage New gastrostomy site - c/d/i Lab Results:   Basename 09/24/11 0450  WBC 8.0  HGB 12.8*  HCT 37.5*  PLT 274   BMET  Basename 09/24/11 0450  NA 138  K 3.7  CL 101  CO2 27  GLUCOSE 102*  BUN 13  CREATININE 0.83  CALCIUM 9.1   PT/INR No results found for this basename: LABPROT:2,INR:2 in the last 72 hours ABG No results found for this basename: PHART:2,PCO2:2,PO2:2,HCO3:2 in the last 72 hours  Studies/Results: No results found.  Anti-infectives: Anti-infectives     Start     Dose/Rate Route Frequency Ordered Stop   09/23/11 2200   piperacillin-tazobactam (ZOSYN) IVPB 3.375 g        3.375 g 12.5 mL/hr over 240 Minutes Intravenous 3 times per day 09/23/11 2125     09/23/11 1310   ceFAZolin (ANCEF) 1-5 GM-% IVPB     Comments: PAYNE, ANGELA: cabinet override         09/23/11 1310 09/24/11 0114   09/23/11 1300   ceFAZolin (ANCEF) IVPB 1 g/50 mL premix        1 g 100 mL/hr over 30 Minutes Intravenous  Once 09/23/11 1259 09/23/11 1340          Assessment/Plan: s/p Procedure(s) (LRB): GASTROSTOMY (N/A) RIGID ESOPHAGOSCOPY (N/A) Will sip on clears today; possible discharge tomorrow or Monday  LOS: 2 days    Lalla Laham K. 09/25/2011

## 2011-09-25 NOTE — Progress Notes (Signed)
1 Day Post-Op  Subjective: Doing very well today, in good spirits. Only complaint of some pain at incision site.   Objective: Vital signs in last 24 hours: Temp:  [97.5 F (36.4 C)-98.1 F (36.7 C)] 97.9 F (36.6 C) (04/13 1000) Pulse Rate:  [51-84] 79  (04/13 1000) Resp:  [13-22] 18  (04/13 1000) BP: (114-137)/(63-81) 121/69 mmHg (04/13 1000) SpO2:  [90 %-100 %] 97 % (04/13 1000) Last BM Date: 09/23/11  Intake/Output from previous day: 04/12 0701 - 04/13 0700 In: 2311 [P.O.:25; I.V.:2036; IV Piggyback:250] Out: 1900 [Urine:1000; Drains:850; Blood:50]  PE:  Alert, oriented.  Spouse present.  Abdominal wound evaluated and redressed.  Sutures intact, clean and dry with no drainage, erythema.  New gastric tube attached to foley with mostly slight purulent serous fluid in bag.  Positive bowel sounds.  New dressing applied. Patient encouraged to continue to ambulate.   Lab Results:   [facility] 09/24/11 0450  WBC 8.0  HGB 12.8*  HCT 37.5*  PLT 274   BMET  Basename 09/24/11 0450  NA 138  K 3.7  CL 101  CO2 27  GLUCOSE 102*  BUN 13  CREATININE 0.83  CALCIUM 9.1    Studies/Results: Ct Abdomen Wo Contrast  09/23/2011  *RADIOLOGY REPORT*  Clinical Data:  G tube placement 09/13/2011.  Pain at insertion site.  Evaluate tube position and possible abscess.  CT ABDOMEN WITHOUT CONTRAST  Technique:  Multidetector CT imaging of the abdomen was performed following the standard protocol without IV contrast.  Comparison:  Chest CT 06/25/2004.  Findings:  The retention balloon for the percutaneous gastrostomy is positioned within the left anterior abdominal wall within the rectus abdominus muscle.  This appears to be retracted from the lumen of the stomach by approximately 2.4 cm.  There is an air tract extending from the tube into the gastric lumen.  There is subcutaneous edema surrounding the tube within the anterior abdominal wall.  No focal fluid collection is identified. There is no free  intraperitoneal air.  The lung bases are clear.  There is no pleural effusion.  As evaluated in the noncontrast state, the liver, spleen, gallbladder, pancreas, adrenal glands and kidneys appear normal.  IMPRESSION:  1.  Percutaneous G tube has withdrawn from the lumen of the stomach and is positioned within the anterior abdominal wall.  Tube repositioning is recommended. 2.  Edema within the subcutaneous fat surrounding the tube may represent mild cellulitis.  No evidence of abscess or free intraperitoneal air.  Drs. Watts and Reunion are aware of these findings.  Original Report Authenticated By: Gerrianne Scale, M.Robinson.    Anti-infectives: Anti-infectives     Start     Dose/Rate Route Frequency Ordered Stop   09/23/11 2200   piperacillin-tazobactam (ZOSYN) IVPB 3.375 g        3.375 g 12.5 mL/hr over 240 Minutes Intravenous 3 times per day 09/23/11 2125     09/23/11 1310   ceFAZolin (ANCEF) 1-5 GM-% IVPB     Comments: PAYNE, ANGELA: cabinet override         09/23/11 1310 09/24/11 0114   09/23/11 1300   ceFAZolin (ANCEF) IVPB 1 g/50 mL premix        1 g 100 mL/hr over 30 Minutes Intravenous  Once 09/23/11 1259 09/23/11 1340          Assessment/Plan:  S/p new operative placement of gastric tube. Endoscopic evaluation in OR revealed no evidence of malignancy.  Patient tolerating well new g-tube site.  To continue to monitor as not currently being used for nutrition actively.   Thanks for the assistance of surgery for this patient.  IR to continue to follow while IP and of course prn any other concerns. Dr. Gaylyn Rong managing oncology treatment.     LOS: 2 days    Steven Robinson 09/25/2011

## 2011-09-26 MED ORDER — AMOXICILLIN-POT CLAVULANATE 875-125 MG PO TABS
1.0000 | ORAL_TABLET | Freq: Two times a day (BID) | ORAL | Status: DC
  Administered 2011-09-26 – 2011-09-27 (×3): 1 via ORAL
  Filled 2011-09-26 (×4): qty 1

## 2011-09-26 MED ORDER — PANTOPRAZOLE SODIUM 40 MG PO TBEC
40.0000 mg | DELAYED_RELEASE_TABLET | Freq: Every day | ORAL | Status: DC
  Administered 2011-09-26: 40 mg via ORAL
  Filled 2011-09-26: qty 1

## 2011-09-26 NOTE — Progress Notes (Signed)
2 Days Post-Op  Subjective: Patient still requiring some IV pain meds for incisional pain.  Tolerating dressing changes.  Wife is learning to do dressing changes.  Tolerating clear liquids  Objective: Vital signs in last 24 hours: Temp:  [97.3 F (36.3 C)-98.2 F (36.8 C)] 97.3 F (36.3 C) (04/14 0626) Pulse Rate:  [72-76] 72  (04/14 0626) Resp:  [18] 18  (04/14 0626) BP: (117-122)/(66-75) 122/68 mmHg (04/14 0626) SpO2:  [95 %-98 %] 97 % (04/14 0626) Last BM Date: 09/24/11  Intake/Output from previous day: 04/13 0701 - 04/14 0700 In: 3028.7 [P.O.:360; I.V.:2668.7] Out: 1200 [Urine:300; Drains:900] Intake/Output this shift:    General appearance: alert, cooperative and no distress Incision/Wound:  Midline - c/d/i Old gastrostomy site with some purulent drainage - wound packing changed  Lab Results:   [facility] 09/24/11 0450  WBC 8.0  HGB 12.8*  HCT 37.5*  PLT 274   BMET  Basename 09/24/11 0450  NA 138  K 3.7  CL 101  CO2 27  GLUCOSE 102*  BUN 13  CREATININE 0.83  CALCIUM 9.1   PT/INR No results found for this basename: LABPROT:2,INR:2 in the last 72 hours ABG No results found for this basename: PHART:2,PCO2:2,PO2:2,HCO3:2 in the last 72 hours  Studies/Results: No results found.  Anti-infectives: Anti-infectives     Start     Dose/Rate Route Frequency Ordered Stop   09/23/11 2200   piperacillin-tazobactam (ZOSYN) IVPB 3.375 g        3.375 g 12.5 mL/hr over 240 Minutes Intravenous 3 times per day 09/23/11 2125     09/23/11 1310   ceFAZolin (ANCEF) 1-5 GM-% IVPB     Comments: PAYNE, ANGELA: cabinet override         09/23/11 1310 09/24/11 0114   09/23/11 1300   ceFAZolin (ANCEF) IVPB 1 g/50 mL premix        1 g 100 mL/hr over 30 Minutes Intravenous  Once 09/23/11 1259 09/23/11 1340          Assessment/Plan: s/p Procedure(s) (LRB): GASTROSTOMY (N/A) RIGID ESOPHAGOSCOPY (N/A) Continue dressing changes PO pain meds/ PO antibiotics Plan discharge  tomorrow Advance diet   LOS: 3 days    Silus Lanzo K. 09/26/2011

## 2011-09-26 NOTE — Progress Notes (Signed)
2 Days Post-Op  Subjective: Pt ok. Gtube capped, on clears. No new c/o  Objective: Vital signs in last 24 hours: Temp:  [97.3 F (36.3 C)-98.2 F (36.8 C)] 97.3 F (36.3 C) (04/14 0626) Pulse Rate:  [72-79] 72  (04/14 0626) Resp:  [18] 18  (04/14 0626) BP: (117-122)/(66-75) 122/68 mmHg (04/14 0626) SpO2:  [95 %-98 %] 97 % (04/14 0626) Last BM Date: 09/24/11  Intake/Output this shift:    Physical Exam: BP 122/68  Pulse 72  Temp(Src) 97.3 F (36.3 C) (Oral)  Resp 18  Ht 6\' 1"  (1.854 m)  Wt 275 lb 1.6 oz (124.785 kg)  BMI 36.30 kg/m2  SpO2 97% WOund looks ok, still some drainage, packing change bid. Gtube intact  Labs: CBC  Basename 09/24/11 0450  WBC 8.0  HGB 12.8*  HCT 37.5*  PLT 274   BMET  Basename 09/24/11 0450  NA 138  K 3.7  CL 101  CO2 27  GLUCOSE 102*  BUN 13  CREATININE 0.83  CALCIUM 9.1   LFT No results found for this basename: PROT,ALBUMIN,AST,ALT,ALKPHOS,BILITOT,BILIDIR,IBILI,LIPASE in the last 72 hours PT/INR No results found for this basename: LABPROT:2,INR:2 in the last 72 hours ABG No results found for this basename: PHART:2,PCO2:2,PO2:2,HCO3:2 in the last 72 hours  Studies/Results: No results found.  Assessment/Plan: S/p open g-tube removal and new placement. Wound care discussed with pt and wife. She feels ok with packing changes and will review with RN today. Appreciate surgery management. Anticipating DC today or tomorrow.    LOS: 3 days    Brayton El PA-C 09/26/2011 9:07 AM

## 2011-09-27 ENCOUNTER — Ambulatory Visit (HOSPITAL_BASED_OUTPATIENT_CLINIC_OR_DEPARTMENT_OTHER): Payer: Managed Care, Other (non HMO) | Admitting: Oncology

## 2011-09-27 ENCOUNTER — Encounter (HOSPITAL_COMMUNITY): Payer: Self-pay | Admitting: General Surgery

## 2011-09-27 ENCOUNTER — Telehealth (INDEPENDENT_AMBULATORY_CARE_PROVIDER_SITE_OTHER): Payer: Self-pay

## 2011-09-27 ENCOUNTER — Other Ambulatory Visit (HOSPITAL_BASED_OUTPATIENT_CLINIC_OR_DEPARTMENT_OTHER): Payer: Managed Care, Other (non HMO) | Admitting: Lab

## 2011-09-27 ENCOUNTER — Telehealth: Payer: Self-pay | Admitting: Oncology

## 2011-09-27 DIAGNOSIS — C760 Malignant neoplasm of head, face and neck: Secondary | ICD-10-CM

## 2011-09-27 DIAGNOSIS — K9422 Gastrostomy infection: Secondary | ICD-10-CM

## 2011-09-27 DIAGNOSIS — K9420 Gastrostomy complication, unspecified: Secondary | ICD-10-CM

## 2011-09-27 DIAGNOSIS — C801 Malignant (primary) neoplasm, unspecified: Secondary | ICD-10-CM

## 2011-09-27 LAB — COMPREHENSIVE METABOLIC PANEL
ALT: 25 U/L (ref 0–53)
BUN: 11 mg/dL (ref 6–23)
CO2: 30 mEq/L (ref 19–32)
Calcium: 9 mg/dL (ref 8.4–10.5)
Chloride: 98 mEq/L (ref 96–112)
Creatinine, Ser: 0.75 mg/dL (ref 0.50–1.35)
Glucose, Bld: 97 mg/dL (ref 70–99)
Total Bilirubin: 0.6 mg/dL (ref 0.3–1.2)

## 2011-09-27 LAB — CBC WITH DIFFERENTIAL/PLATELET
Basophils Absolute: 0 10*3/uL (ref 0.0–0.1)
Eosinophils Absolute: 0.4 10*3/uL (ref 0.0–0.5)
HCT: 37.6 % — ABNORMAL LOW (ref 38.4–49.9)
HGB: 12.7 g/dL — ABNORMAL LOW (ref 13.0–17.1)
LYMPH%: 18.3 % (ref 14.0–49.0)
MCV: 87.9 fL (ref 79.3–98.0)
MONO#: 0.8 10*3/uL (ref 0.1–0.9)
MONO%: 9.9 % (ref 0.0–14.0)
NEUT#: 5.6 10*3/uL (ref 1.5–6.5)
NEUT%: 67.4 % (ref 39.0–75.0)
Platelets: 280 10*3/uL (ref 140–400)
RBC: 4.28 10*6/uL (ref 4.20–5.82)
WBC: 8.4 10*3/uL (ref 4.0–10.3)

## 2011-09-27 MED ORDER — HYDROCODONE-ACETAMINOPHEN 5-325 MG PO TABS: 1.0000 | ORAL_TABLET | ORAL | Status: DC | PRN

## 2011-09-27 MED ORDER — AMOXICILLIN-POT CLAVULANATE 875-125 MG PO TABS: 1.0000 | ORAL_TABLET | Freq: Two times a day (BID) | ORAL | Status: DC

## 2011-09-27 NOTE — Progress Notes (Signed)
BP 126/84  Pulse 80  Temp(Src) 98 F (36.7 C) (Oral)  Resp 18  Ht 6\' 1"  (1.854 m)  Wt 275 lb 1.6 oz (124.785 kg)  BMI 36.30 kg/m2  SpO2 99% Pt doing well. Wife ok with dressing changes. Wounds look good, decreased drainage. G-tube site clean. For DC today  Allayne Butcher 09/27/2011 8:53 AM

## 2011-09-27 NOTE — Progress Notes (Signed)
3 Days Post-Op  Subjective: Patient stable on oral vicodin. No nausea or emesis, tomato soup aggravated reflux. Feels ready for regular diet and hopes for DC today. Has onc appt this afternoon.   Objective: Vital signs in last 24 hours: Temp:  [97.9 F (36.6 C)-98.1 F (36.7 C)] 98 F (36.7 C) (04/15 0604) Pulse Rate:  [69-80] 80  (04/15 0604) Resp:  [18] 18  (04/15 0604) BP: (123-130)/(73-84) 126/84 mmHg (04/15 0604) SpO2:  [92 %-99 %] 99 % (04/15 0604) Last BM Date: 09/24/11  Intake/Output from previous day: 04/14 0701 - 04/15 0700 In: 510 [P.O.:480] Out: -  Intake/Output this shift:    General appearance: alert, cooperative and no distress Incision/Wound:  Midline - c/d/i-staples in place. No significant erythema Old gastrostomy site with some purulent drainage - wound packing in place and open CV: RRR  Lab Results:  No results found for this basename: WBC:2,HGB:2,HCT:2,PLT:2 in the last 72 hours BMET No results found for this basename: NA:2,K:2,CL:2,CO2:2,GLUCOSE:2,BUN:2,CREATININE:2,CALCIUM:2 in the last 72 hours PT/INR No results found for this basename: LABPROT:2,INR:2 in the last 72 hours ABG No results found for this basename: PHART:2,PCO2:2,PO2:2,HCO3:2 in the last 72 hours  Studies/Results: No results found.  Medications:    . amoxicillin-clavulanate  1 tablet Oral Q12H  . pantoprazole  40 mg Oral Q1200  . DISCONTD: pantoprazole (PROTONIX) IV  40 mg Intravenous QHS  . DISCONTD: piperacillin-tazobactam (ZOSYN)  IV  3.375 g Intravenous Q8H     Assessment/Plan: s/p Procedure(s) (LRB): GASTROSTOMY (N/A) RIGID ESOPHAGOSCOPY (N/A) Continue dressing changes PO pain meds/ PO antibiotics-will discuss augmentin course length with Dr. Carolynne Edouard.  Oral PPI daily Advance diet to regular. Plan discharge today Close follow up, will have [facility] RN for wound check later this week.    LOS: 4 days    [facility] PGY-2 09/27/2011

## 2011-09-27 NOTE — Progress Notes (Signed)
Pt's wife demonstrated dressing change without difficulty.  Discharge instructions reviewed with pt and pt's wife and prescriptions given.  Pt and pt's wife verbalized understanding and questions answered.  Pt discharged in stable condition via wheelchair with wife.  Hector Shade [place]

## 2011-09-27 NOTE — Telephone Encounter (Signed)
appts made and printed for pt aom °

## 2011-09-27 NOTE — Discharge Instructions (Signed)
Gastrostomy Tube, Adult  A gastrostomy tube is a tube that is placed into the stomach. It is also called a "G-tube." This tube is used for:   Feeding.   Giving medication.  CLEANING THE G-TUBE SITE   Wash your hands with soap and water.   Remove the old dressing (if any). Some styles of G-tubes may need a dressing inserted between the skin and the G-tube. Other types of G-tubes do not require a dressing. Ask your caregiver if a dressing is needed.   Check the area where the tube enters the skin (insertion site) for redness, swelling, or pus-like (purulent) drainage. A small amount of clear or tan liquid drainage is normal. Check to make sure scar tissue (skin) is not growing around the insertion site. This could have a raised, bumpy appearance.   A cotton swab can be used to clean the skin around the tube:   When the G-tube is first put in, a normal saline solution or water can be used to clean the skin.   Mild soap and warm water can be used when the skin around the G-tube site has healed.   Roll the cotton swab around the G-tube insertion site to remove any drainage or crusting at the insertion site.  RESIDUALS  Feeding tube residuals are the amount of liquids that are in the stomach at any given time. Residuals may be checked before giving feedings, medications, or as instructed by your caregiver.   Ask your caregiver if there are instances when you would not start tube feedings depending on the amount or type of contents withdrawn from the stomach.   Check residuals by attaching a syringe to the G-tube and pull back on the syringe plunger. Note the amount and return the residual back into the stomach.  FLUSHING THE G-TUBE   The G-tube should be periodically flushed with clean warm water to keep it from clogging.   Flush the G-tube after feedings or medications. Draw up 30 mLs of warm water in a syringe. Connect the syringe to the G-tube and slowly push the water into the tube.   Do not push  feedings, medications, or flushes rapidly. Flush the G-tube gently and slowly.   Only use syringes made for G-tubes to flush medications or feedings.   Your caregiver may want the G-tube flushed more often or with more water. If this is the case, follow your caregiver's instructions.  FEEDINGS  Your caregiver will determine whether feedings are given as a bolus (a certain amount given at one time and at scheduled times) or whether feedings will be given continuously on a feeding pump.    Formulas should be given at room temperature.   If feedings are continuous, no more than 4 hours worth of feedings should be placed in the feeding bag. This helps prevent spoilage or accidental excess infusion.   Cover and place unused formula in the refrigerator.   If feedings are continuous, stop the feedings when medications or flushes are given. Be sure to restart the feedings.   Feeding bags and syringes should be replaced as instructed by your caregiver.  GIVING MEDICATION    In general, it is best if all medications are in a liquid form for G-tube administration. Liquid medications are less likely to clog the G-tube.   Mix the liquid medication with 30 mLs (or amount recommended by your caregiver) of warm water.   Draw up the medication into the syringe.   Attach the syringe to   the G-tube and slowly push the mixture into the G-tube.   After giving the medication, draw up 30 mLs of warm water in the syringe and slowly flush the G-tube.   For pills or capsules, check with your caregiver first before crushing medications. Some pills are not effective if they are crushed. Some capsules are sustained release medications.   If appropriate, crush the pill or capsule and mix with 30 mLs of warm water. Using the syringe, slowly push the medication through the tube, then flush the tube with another 30 mLs of tap water.  G-TUBE PROBLEMS  G-tube was pulled out.   Cause: May have been pulled out accidentally.   Solutions:  Cover the opening with clean dressing and tape. Call your caregiver right away. The G-tube should be put in as soon as possible (within 4 hours) so the G-tube opening (tract) does not close. The G-tube needs to be put in at a healthcare setting. An X-ray needs to be done to confirm placement before the G-tube can be used again.  Redness, irritation, soreness, or foul odor around the gastrostomy site.   Cause: May be caused by leakage or infection.   Solutions: Call your caregiver right away.  Large amount of leakage of fluid or mucus-like liquid present (a large amount means it soaks clothing).   Cause: Many reasons could cause the G-tube to leak.   Solutions: Call your caregiver to discuss the amount of leakage.  Skin or scar tissue appears to be growing where tube enters skin.    Cause: Tissue growth may develop around the insertion site if the G-tube is moved or pulled on excessively.   Solutions: Secure tube with tape so that excess movement does not occur. Call your caregiver.  G-tube is clogged.   Cause: Thick formula or medication.   Solutions: Try to slowly push warm water into the tube with a large syringe. Never try to push any object into the tube to unclog it. Do not force fluid into the G-tube. If you are unable to unclog the tube, call your caregiver right away.  TIPS   Head of Bed (HOB) position refers to the upright position of a person's upper body.   When giving medications or a feeding bolus, keep the HOB up as told by your caregiver. Do this during the feeding and for 1 hour after the feeding or medication administration.   If continuous feedings are being given, it is best to keep the HOB up as told by your caregiver. When ADLs (Activities of Daily Living) are performed and the HOB needs to be flat, be sure to turn the feeding pump off. Restart the feeding pump when the HOB is returned to the recommended height.   Do not pull or put tension on the tube.   To prevent fluid backflow,  kink the G-tube before removing the cap or disconnecting a syringe.   Check the G-tube length every day. Measure from the insertion site to the end of the G-tube. If the length is longer than previous measurements, the tube may be coming out. Call your caregiver if you notice increasing G-tube length.   Oral care, such as brushing teeth, must be continued.   You may need to remove excess air (vent) from the G-tube. Your caregiver will tell you if this is needed.   Always call your caregiver if you have questions or problems with the G-tube.  SEEK IMMEDIATE MEDICAL CARE IF:    You have severe   abdominal pain, tenderness, or abdominal bloating(distension).   You have nausea or vomiting.   You are constipated or have problems moving your bowels.   The G-tube insertion site is red, swollen, has a foul smell, or has yellow or brown drainage.   You have difficulty breathing or shortness of breath.   You have a fever.   You have a large amount of feeding tube residuals.   The G-tube is clogged and cannot be flushed.  MAKE SURE YOU:    Understand these instructions.   Will watch your condition.   Will get help right away if you are not doing well or get worse.  Document Released: 08/09/2001 Document Revised: 05/20/2011 Document Reviewed: 09/26/2007  ExitCare Patient Information 2012 [employer].

## 2011-09-27 NOTE — Progress Notes (Signed)
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  He had positive PET scan in the left base of the tongue but direct laryngoscopy and biopsy was negative.   PAST THERAPY:  FNA only   CURRENT THERAPY: due to start concurrent chemoradiation.   INTERVAL HISTORY: Steven Robinson 61 y.o. male returns for regular follow up to discuss last minutes question before starting chemo radiation within the next few weeks.  He had PEG tube placement about 2 weeks ago that resulted in dislodgement.  He had to have it removed since there was also focal infection.  He had a G tube placed by Gen Surgery last week.  He is packing the cavity that was the site of the removed PEG tube.  He still has staples from the vertical abdominal wound.  He has abdominal pain from these two wounds.  He feels weak; however, he is still independent of activities of daily living.  He denies odynophagia, dysphagia, mucositis.  He still has left neck swelling which is not causing any pain.  Patient denies headache, visual changes, confusion, drenching night sweats, palpable lymph node swelling, mucositis, odynophagia, dysphagia, nausea vomiting, jaundice, chest pain, palpitation, shortness of breath, dyspnea on exertion, productive cough, gum bleeding, epistaxis, hematemesis, hemoptysis, abdominal swelling, early satiety, melena, hematochezia, hematuria, skin rash, spontaneous bleeding, joint swelling, joint pain, heat or cold intolerance, bowel bladder incontinence, back pain, focal motor weakness, paresthesia, depression, suicidal or homocidal ideation, feeling hopelessness.     Past Medical History  Diagnosis Date  . Hyperlipoproteinemia   . Obstructive sleep apnea     on C-pap.   [name] GERD (gastroesophageal reflux disease)     chronic  . Hx of colonic polyps   .  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 Vertigo     2/2 intersitial neuritis.   [name] Hearing loss of left ear   . BPH (benign prostatic hyperplasia)   . 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 Hernia repair 1990    Left  . Fine needle aspiration 08/26/11     LEFT NECK FNA, LYMPH NODE, METASTATIC SQUAMOUS CELL CARCINOMA  . Gastrostomy 09/24/2011    Procedure: GASTROSTOMY;  Surgeon: Liz Malady, MD;  Location[facility] OR;  Service: General;  Laterality: N/A;  open G-tube placement procedure start @1219   . Rigid esophagoscopy 09/24/2011    Procedure: RIGID ESOPHAGOSCOPY;  Surgeon: Suzanna Obey, MD;  Location: [facility] OR;  Service: ENT;  Laterality: N/A;  Esophagoscopy procedure end @1210     Current Outpatient Prescriptions  Medication Sig Dispense Refill  . ALPRAZolam (XANAX) 0.5 MG tablet Prn      . amoxicillin-clavulanate (AUGMENTIN) 875-125 MG per tablet Take 1 tablet by mouth every 12 (twelve) hours.  6 tablet  0  . dexlansoprazole (DEXILANT) 60 MG capsule Take 60 mg by mouth daily.      . diphenhydrAMINE (BENADRYL) 25 MG tablet Take 25 mg by mouth at bedtime.       . DYMISTA 137-50 MCG/ACT SUSP As directed      . HYDROcodone-acetaminophen (NORCO) 5-325 MG per tablet Take 1-2 tablets by mouth every 4 (four) hours as needed.  15 tablet  0  . omega-3 acid ethyl esters (LOVAZA) 1 G capsule Take 4 g by mouth at bedtime.       [name]  rosuvastatin (CRESTOR) 40 MG tablet Take 40 mg by mouth daily.      . Tamsulosin HCl (FLOMAX) 0.4 MG CAPS Take 0.4 mg by mouth daily.       [name] zolpidem (AMBIEN) 10 MG tablet Take 10 mg by mouth at bedtime.       [name] aspirin EC 81 MG tablet Take 81 mg by mouth daily.       No current facility-administered medications for this visit.   Facility-Administered Medications Ordered in Other Visits  Medication Dose Route Frequency Provider Last Rate Last Dose  . DISCONTD: amoxicillin-clavulanate (AUGMENTIN) 875-125 MG per tablet 1 tablet  1  tablet Oral Q12H Matthew K. Tsuei, MD   1 tablet at 09/27/11 1013  . DISCONTD: diphenhydrAMINE (BENADRYL) 12.5 MG/5ML elixir 12.5-25 mg  12.5-25 mg Oral Q6H PRN Letha Cape, PA      . DISCONTD: diphenhydrAMINE (BENADRYL) injection 12.5-25 mg  12.5-25 mg Intravenous Q6H PRN Letha Cape, PA      . DISCONTD: HYDROcodone-acetaminophen (NORCO) 5-325 MG per tablet 1-2 tablet  1-2 tablet Oral Q4H PRN Letha Cape, PA   2 tablet at 09/27/11 1013  . DISCONTD: HYDROmorphone (DILAUDID) injection 1-2 mg  1-2 mg Intravenous Q3H PRN Liz Malady, MD   1 mg at 09/26/11 0959  . DISCONTD: ondansetron (ZOFRAN) injection 4 mg  4 mg Intravenous Q6H PRN Letha Cape, PA   4 mg at 09/26/11 1208  . DISCONTD: pantoprazole (PROTONIX) EC tablet 40 mg  40 mg Oral Q1200 Wilmon Arms. Tsuei, MD   40 mg at 09/26/11 1208  . DISCONTD: zolpidem (AMBIEN) tablet 10 mg  10 mg Oral QHS PRN Emelia Loron, MD   10 mg at 09/26/11 2318    ALLERGIES:  is allergic to cyclobenzaprine hcl.  REVIEW OF SYSTEMS:  The rest of the 14-point review of system was negative.   Filed Vitals:   09/27/11 1433  BP: 122/78  Pulse: 70  Temp: 97 F (36.1 C)   Wt Readings from Last 3 Encounters:  09/27/11 269 lb 4.8 oz (122.154 kg)  09/23/11 275 lb 1.6 oz (124.785 kg)  09/23/11 275 lb 1.6 oz (124.785 kg)   ECOG Performance status: 1  PHYSICAL EXAMINATION:   General: well-nourished in no acute distress. Eyes: no scleral icterus. ENT: There were no oropharyngeal lesions. Neck was without thyromegaly. Lymphatics: Positive for multiple nodes matted together in the left level 2-3 nodes. Negative for supraclavicular or axillary adenopathy. Respiratory: lungs were clear bilaterally without wheezing or crackles. Cardiovascular: Regular rate and rhythm, S1/S2, without murmur, rub or gallop. There was no pedal edema. GI: abdomen was soft, flat, nontender, nondistended, without organomegaly.  RUQ removed PEG tube site has packed gauges  which was dry/clean/intact.  He has a surgical G-tube.  Vertical abdominal wound with staples again dry/clean/intact.  Muscoloskeletal: no spinal tenderness of palpation of vertebral spine. Skin exam was without echymosis, petichae. Neuro exam was nonfocal. Patient was able to get on and off exam table without assistance. Gait was normal. Patient was alerted and oriented. Attention was good. Language was appropriate. Mood was normal without depression. Speech was not pressured. Thought content was not tangential.     LABORATORY/RADIOLOGY DATA:  Lab Results  Component Value Date   WBC 8.4 09/27/2011   HGB 12.7* 09/27/2011   HCT 37.6* 09/27/2011   PLT 280 09/27/2011   GLUCOSE 102* 09/24/2011   ALKPHOS 51 09/06/2011   ALT 37 09/06/2011   AST 25 09/06/2011  NA 138 09/24/2011   K 3.7 09/24/2011   CL 101 09/24/2011   CREATININE 0.83 09/24/2011   BUN 13 09/24/2011   CO2 27 09/24/2011   INR 0.91 09/13/2011    ASSESSMENT AND PLAN:   1. History of smoking: Quit long time along in college.   2. History of drinking a few glasses of wine a day: He also quit this 3 years ago.   3. Obstructive sleep apnea: He is on C-pap.   4. BPH: Symptoms well controlled with Flomax.   5. Newly diagnosed cT1 N2b M0 SCC; HPV equivocal.   STAGING: Most likely oropharynx. Even though his repeat DL did not show any mucosal abnormality, PET scan showed uptake in the left oropharynx.  However, he will be treated as unknown primary by Rad Onc due to negative random biopsies.     TREATMENT OPTIONS: he has discussed with his wife over the 3 different chemo options and would like to proceed with cisplatin.  I again discussed with patient side effects of cisplatin which include but not limited to nausea vomiting, alopecia, fatigue, mucositis, ototoxicity, nephrotoxicity, abnormal electrolytes, cytopenia, risk of bleeding and infection.  His baseline audiogram showed mild high frequency hearing loss.    TREATMENT PLAN: - he  recently has PEG tube removed and had a surgical G tube placed by Gen Surg.  I recommended proceeding with XRT as planned on 10/04/2011.   - I recommended to delay start of chemo until 10/11/2011 due to recent placement of G tube and still healing from a removed PEG tube site; to facilitate healing.  I also recommended changing q3wk Cisplatin 100mg /m2 to qweekly Cisplatin 100mg /m2 for ease of toleration and to minimize the risk of G-tube infection.  - I will see him again on 10/11/2011 before starting chemo that day.    The length of time of the face-to-face encounter was 15 minutes. More than 50% of time was spent counseling and coordination of care.

## 2011-09-27 NOTE — Discharge Summary (Signed)
Physician Discharge Summary  Patient ID: Steven Robinson MRN: [id number] DOB/AGE: 1951/03/12 61 y.o.  Admit date: 09/23/2011 Discharge date: 09/27/2011  Admission Diagnoses: Principal Problem:  *Gastrostomy complication-migration of PEG Active Problems:  Head and neck cancer  Secondary and unspecified malignant neoplasm of lymph nodes of head, face, and neck  Cellulitis at gastrostomy tube site HLD OSA  Discharge Diagnoses:  Principal Problem:  *Gastrostomy complication, s/p PEG removal and open G-tube placement Active Problems:  Head and neck cancer  Secondary and unspecified malignant neoplasm of lymph nodes of head, face, and neck  Cellulitis at gastrostomy tube site HLD OSA  Discharged Condition: good  Hospital Course: 61 yo male with head and neck cancer presents one week after placement of PEG tube with pain, cellulitis and found to have migration of mushroom into the abdominal wall by CT scan. Tube was placed originally by IR for preparation of initiation of chemotherapy and radiation for the cancer. An open G-tube was placed in exchange for the PEG which was removed. Wound showed vast improvement in cellulitic component on IV antibiotic, which was transitioned to augmentin to complete 7 day course. PEG site was packed and will continue daily packing. Will have wound care RN for home visit within one week and patient will f/u in office for staple removal next week. Patient tolerated full diet prior to DC with minimal pain.   Consults: None  Significant Diagnostic Studies: radiology: CT scan  Treatments: antibiotics: Zosyn 4/12 PEG retrieval/removal and Open Gastrostomy tube placement  Discharge Exam: Blood pressure 126/84, pulse 80, temperature 98 F (36.7 C), temperature source Oral, resp. rate 18, height 6\' 1"  (1.854 m), weight 275 lb 1.6 oz (124.785 kg), SpO2 99.00%. General appearance: alert, cooperative and no distress Head: Normocephalic, without obvious abnormality,  atraumatic Resp: clear to auscultation bilaterally Cardio: regular rate and rhythm, S1, S2 normal, no murmur, click, rub or gallop GI: Midline - c/d/i-staples in place. No significant erythema surrounding. Nondistended. BS + Old gastrostomy site with some purulent drainage - wound packing in place and open Extremities: extremities normal, atraumatic, no cyanosis or edema Skin: Skin color, texture, turgor normal. No rashes or lesions Neurologic: Grossly normal  Disposition: Final discharge disposition not confirmed   Medication List  As of 09/27/2011  9:24 AM   TAKE these medications         amoxicillin-clavulanate 875-125 MG per tablet   Commonly known as: AUGMENTIN   Take 1 tablet by mouth every 12 (twelve) hours.      aspirin EC 81 MG tablet   Take 81 mg by mouth daily.      dexlansoprazole 60 MG capsule   Commonly known as: DEXILANT   Take 60 mg by mouth daily.      diphenhydrAMINE 25 MG tablet   Commonly known as: BENADRYL   Take 25 mg by mouth at bedtime.      HYDROcodone-acetaminophen 5-325 MG per tablet   Commonly known as: NORCO   Take 1-2 tablets by mouth every 4 (four) hours as needed.      omega-3 acid ethyl esters 1 G capsule   Commonly known as: LOVAZA   Take 4 g by mouth at bedtime.      rosuvastatin 40 MG tablet   Commonly known as: CRESTOR   Take 40 mg by mouth daily.      Tamsulosin HCl 0.4 MG Caps   Commonly known as: FLOMAX   Take 0.4 mg by mouth daily.      zolpidem  10 MG tablet   Commonly known as: AMBIEN   Take 10 mg by mouth at bedtime.           Follow-up Information    Follow up with Liz Malady, MD. (our office will call you with an appointment time )    Contact information:   [facility] [address] [place] [address] [phone]          SignedLloyd Huger PGY-2 09/27/2011, 9:17 AM

## 2011-09-27 NOTE — Progress Notes (Signed)
Spoke with pt and wife about HHRN order. Wife stated that she was comfortable with changing the dressing and the signs/symptoms of infection to monitor for in the wound. Politely declined the [facility] at this time but stated understanding that if wound worsened or required more intensive care, they could speak with [facility] about setting up [facility] at later time. Reviewed s/s of wound infections with pt and wife again.  No HH arrangements made at this time per their request. PA updated on this.

## 2011-09-27 NOTE — Telephone Encounter (Signed)
Tresa Endo our PA called needing appt for Steven Robinson for  Week of 4-24. I only saw urgent office.  Please call pt with appt.

## 2011-09-29 ENCOUNTER — Encounter (INDEPENDENT_AMBULATORY_CARE_PROVIDER_SITE_OTHER): Payer: Self-pay | Admitting: General Surgery

## 2011-09-29 ENCOUNTER — Ambulatory Visit (INDEPENDENT_AMBULATORY_CARE_PROVIDER_SITE_OTHER): Payer: Managed Care, Other (non HMO) | Admitting: General Surgery

## 2011-09-29 VITALS — BP 120/82 | HR 72 | Temp 96.8°F | Resp 18 | Ht 73.0 in | Wt 264.0 lb

## 2011-09-29 DIAGNOSIS — Z431 Encounter for attention to gastrostomy: Secondary | ICD-10-CM

## 2011-09-29 MED ORDER — AMOXICILLIN-POT CLAVULANATE 875-125 MG PO TABS: 1.0000 | ORAL_TABLET | Freq: Two times a day (BID) | ORAL | Status: AC

## 2011-09-29 NOTE — Progress Notes (Signed)
Subjective:     Patient ID: Steven Robinson, male   DOB: [birth year], 61 y.o.   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 He is being treated by Dr.  Gaylyn Rong. He underwent gastrostomy tube placement by interventional radiology. This tube became dislodged. Last Friday I placed an open gastrostomy tube and removed the dislodged the tube. He has had some drainage from the central portion of his wound. He continues to do packing on the old G-tube site. He is otherwise doing well. She was flushing easily.  Review of Systems     Objective:   Physical Exam Abdomen is soft and nontender. There is a small amount of drainage at the central portion of his midline incision. One staple was removed and a small amount of old hematoma/purulence was removed. This was cleaned out and packed with iodoform. Iodoform was changed from his old G-tube site as well. There is no cellulitis at either site.    Assessment:     Status post gastrostomy tube and removal of displaced gastrostomy tube    Plan:     Iodoform packing b.i.d. to both wound sites. Continue flushing the tube as previously. I refilled his Augmentin for 7 days further. He wanted to hold off on beginning treatments by oncology at least until I see him next week.

## 2011-09-30 ENCOUNTER — Ambulatory Visit (INDEPENDENT_AMBULATORY_CARE_PROVIDER_SITE_OTHER): Payer: Managed Care, Other (non HMO) | Admitting: Surgery

## 2011-09-30 ENCOUNTER — Encounter (INDEPENDENT_AMBULATORY_CARE_PROVIDER_SITE_OTHER): Payer: Self-pay | Admitting: Surgery

## 2011-09-30 VITALS — BP 130/86 | HR 80 | Temp 98.0°F | Ht 73.0 in | Wt 266.0 lb

## 2011-09-30 DIAGNOSIS — Z9889 Other specified postprocedural states: Secondary | ICD-10-CM

## 2011-09-30 NOTE — Patient Instructions (Signed)
Continue antibiotics and wound care.  Keep follow up next week.

## 2011-09-30 NOTE — Progress Notes (Signed)
The patient returned to to drainage around his G-tube site. The patient had an open G-tube placement by Dr. Janee Morn for head and neck cancer. The patient noticed some thick mucoid drainage from around the tube. He has no fever or chills. He denies nausea vomiting. There is no significant redness. He was seen yesterday and had stable removed and part of his wound packed. He is on Augmentin.  Exam: G-tube site is clean with no significant erythema or fluctuance. Minimal yellow drainage noted consistent with fat necrosis. Minimal redness inferior portion of incision. I took out 2 staples and probed with Q-tip. There is no puss in the fascia feels intact. This was packed.  Impression: Drainage from G-tube  Plan: I see no signs of infection and this appears to be fat necrosis. The wound is overall clean and the patient will change back into the open area at the bottom with the other open area. Continue Augmentin. Keep appointment with Dr. Janee Morn.

## 2011-10-04 ENCOUNTER — Encounter: Payer: Self-pay | Admitting: Radiation Oncology

## 2011-10-04 ENCOUNTER — Ambulatory Visit
Admission: RE | Admit: 2011-10-04 | Discharge: 2011-10-04 | Disposition: A | Discharge status: Home / Self Care | Payer: Managed Care, Other (non HMO) | Source: Ambulatory Visit | Attending: Radiation Oncology | Admitting: Radiation Oncology

## 2011-10-04 ENCOUNTER — Ambulatory Visit: Payer: Self-pay

## 2011-10-04 VITALS — Wt 267.4 lb

## 2011-10-04 DIAGNOSIS — C770 Secondary and unspecified malignant neoplasm of lymph nodes of head, face and neck: Secondary | ICD-10-CM

## 2011-10-04 NOTE — Progress Notes (Signed)
   Weekly Management Note. Head and Neck cancer, unknown primary Current Dose:   200 cGy  Projected Dose: 7000 cGy   Narrative:  The patient presents for routine under treatment assessment.  CBCT/MVCT images/Port film x-rays were reviewed.  The chart was checked. He is doing relatively well. His gastrostomy tube dislodged and he developed an infection. This was replaced surgically. He continues to followup with Gen. surgery to manage his dressings and healing. Chemotherapy will not be started for now, due to his infection. He is taking Augmentin.  Physical Findings: Weight: 267 lb 6.4 oz (121.292 kg). I inspected the patient's abdomen and dressings. There is minimal yellow drainage which is consistent with fat necrosis per Dr. Rosezena Sensor notes. The patient has a palpable mass in the level 2/3 region of the left neck  CBC    Component Value Date/Time   WBC 8.4 09/27/2011 1415   WBC 8.0 09/24/2011 0450   RBC 4.28 09/27/2011 1415   RBC 4.30 09/24/2011 0450   HGB 12.7* 09/27/2011 1415   HGB 12.8* 09/24/2011 0450   HCT 37.6* 09/27/2011 1415   HCT 37.5* 09/24/2011 0450   PLT 280 09/27/2011 1415   PLT 274 09/24/2011 0450   MCV 87.9 09/27/2011 1415   MCV 87.2 09/24/2011 0450   MCH 29.7 09/27/2011 1415   MCH 29.8 09/24/2011 0450   MCHC 33.8 09/27/2011 1415   MCHC 34.1 09/24/2011 0450   RDW 12.6 09/27/2011 1415   RDW 12.4 09/24/2011 0450   LYMPHSABS 1.5 09/27/2011 1415   MONOABS 0.8 09/27/2011 1415   EOSABS 0.4 09/27/2011 1415   BASOSABS 0.0 09/27/2011 1415    CMP     Component Value Date/Time   NA 138 09/27/2011 1415   K 4.0 09/27/2011 1415   CL 98 09/27/2011 1415   CO2 30 09/27/2011 1415   GLUCOSE 97 09/27/2011 1415   BUN 11 09/27/2011 1415   CREATININE 0.75 09/27/2011 1415   CALCIUM 9.0 09/27/2011 1415   PROT 6.3 09/27/2011 1415   ALBUMIN 3.7 09/27/2011 1415   AST 27 09/27/2011 1415   ALT 25 09/27/2011 1415   ALKPHOS 116 09/27/2011 1415   BILITOT 0.6 09/27/2011 1415   GFRNONAA >90 09/24/2011 0450   GFRAA  >90 09/24/2011 0450     Impression:  The patient is tolerating radiotherapy.  Plan:  Continue radiotherapy as planned.

## 2011-10-04 NOTE — Progress Notes (Signed)
IMRT Device Note: 9.8 delivered field widths represent one set of IMRT treatment devices. The code is [phone].

## 2011-10-04 NOTE — Progress Notes (Signed)
Patient presents to the clinic today accompanied by his wife for an under treat visit with Dr. Basilio Cairo. Patient is alert and oriented to person, place, and time. No distress noted. Steady gait noted. Pleasant affect noted. Patient denies pain at this time. Patient reports he continues to be sore at his old PEG tube site and new surgical site. Patient reports he is taking Augmentin as directed. Patient reports he will take antibiotics until Wednesday when he sees Dr. Janee Morn again. Patient reports taking Xanax prior to treatment today to calm his nerves related to mask. Reported all findings to Dr. Basilio Cairo.

## 2011-10-05 ENCOUNTER — Ambulatory Visit
Admission: RE | Admit: 2011-10-05 | Discharge: 2011-10-05 | Disposition: A | Discharge status: Home / Self Care | Payer: Managed Care, Other (non HMO) | Source: Ambulatory Visit | Attending: Radiation Oncology | Admitting: Radiation Oncology

## 2011-10-06 ENCOUNTER — Ambulatory Visit
Admission: RE | Admit: 2011-10-06 | Discharge: 2011-10-06 | Disposition: A | Discharge status: Home / Self Care | Payer: Managed Care, Other (non HMO) | Source: Ambulatory Visit | Attending: Radiation Oncology | Admitting: Radiation Oncology

## 2011-10-06 ENCOUNTER — Encounter (INDEPENDENT_AMBULATORY_CARE_PROVIDER_SITE_OTHER): Payer: Self-pay | Admitting: General Surgery

## 2011-10-06 ENCOUNTER — Ambulatory Visit (INDEPENDENT_AMBULATORY_CARE_PROVIDER_SITE_OTHER): Payer: Managed Care, Other (non HMO) | Admitting: General Surgery

## 2011-10-06 ENCOUNTER — Telehealth (INDEPENDENT_AMBULATORY_CARE_PROVIDER_SITE_OTHER): Payer: Self-pay | Admitting: General Surgery

## 2011-10-06 VITALS — BP 120/82 | HR 60 | Temp 96.9°F | Resp 18 | Wt 265.0 lb

## 2011-10-06 DIAGNOSIS — Z931 Gastrostomy status: Secondary | ICD-10-CM

## 2011-10-06 NOTE — Progress Notes (Signed)
Subjective:     Patient ID: Westin Knotts, male   DOB: 19-Oct-1950, 61 y.o.   MRN: [id number]  HPI Patient status postoperative gastrostomy after dislodgment of interventional radiology placed gastrostomy. He has had some midline wound drainage and 2 areas of staples and then removed. His wife is doing daily packing changes. He is taking Augmentin. He is flushing the tube. Is not taking pain medication any longer. He's feeling better.  Review of Systems     Objective:   Physical Exam Abdomen is soft and nontender. No gastrostomy tube site is healing well. 2 areas on the midline no repacked with quarter inch iodoform. There is no cellulitis. There is some serous drainage remaining. The dressing was applied.    Assessment:     wound healing after open gastrostomy    Plan:     Patient has begun radiation treatments.  We need to hold off on chemotherapy for another 2 weeks and I will see him again. I will reevaluate at that time. His wound needs to be more healed before it is safe to begin chemotherapy.Continue wound care. Complete course of Augmentin

## 2011-10-06 NOTE — Telephone Encounter (Signed)
I made an appt for 5/8 at 0900 because it looks like he has another appt that am. I left the pt a message to call me only if he can't do that time.

## 2011-10-07 ENCOUNTER — Other Ambulatory Visit: Payer: Self-pay | Admitting: Oncology

## 2011-10-07 ENCOUNTER — Ambulatory Visit
Admission: RE | Admit: 2011-10-07 | Discharge: 2011-10-07 | Disposition: A | Discharge status: Home / Self Care | Payer: Managed Care, Other (non HMO) | Source: Ambulatory Visit | Attending: Radiation Oncology | Admitting: Radiation Oncology

## 2011-10-07 ENCOUNTER — Telehealth: Payer: Self-pay | Admitting: *Deleted

## 2011-10-07 NOTE — Telephone Encounter (Signed)
Pt called to inform he saw his surgeon, Dr. Janee Morn today who says pt still can't start chemo for at least 2 more weeks due to abd wounds from PEG tube removal still need to heal.  He has appt to see Dr. Janee Morn again in 2 weeks on 4/08.  Per Dr. Gaylyn Rong,  Delay lab/office visit and chemo start date until 4/08 and 4/09.  POF sent to scheduling and I notified pt.  Informed him to expect a call from scheduling.  He verbalized understanding.

## 2011-10-08 ENCOUNTER — Ambulatory Visit
Admission: RE | Admit: 2011-10-08 | Discharge: 2011-10-08 | Disposition: A | Discharge status: Home / Self Care | Payer: Managed Care, Other (non HMO) | Source: Ambulatory Visit | Attending: Radiation Oncology | Admitting: Radiation Oncology

## 2011-10-08 ENCOUNTER — Telehealth: Payer: Self-pay | Admitting: Oncology

## 2011-10-08 ENCOUNTER — Other Ambulatory Visit: Payer: Self-pay | Admitting: Lab

## 2011-10-08 ENCOUNTER — Telehealth: Payer: Self-pay | Admitting: *Deleted

## 2011-10-08 NOTE — Telephone Encounter (Signed)
Per staff message from Ken[place], I have scheduled appt for patient. Eunice aware.   JMW

## 2011-10-08 NOTE — Telephone Encounter (Signed)
called pt Steven Robinson that his appts for 04/26,04/29, 05/07 were canceled and his neext appt with [place] was for 05/08 lab and md and tx on 05/09.  sent email to michele to change start date for tx on 05/09

## 2011-10-11 ENCOUNTER — Other Ambulatory Visit: Payer: Self-pay | Admitting: Radiation Oncology

## 2011-10-11 ENCOUNTER — Telehealth (INDEPENDENT_AMBULATORY_CARE_PROVIDER_SITE_OTHER): Payer: Self-pay | Admitting: General Surgery

## 2011-10-11 ENCOUNTER — Ambulatory Visit
Admission: RE | Admit: 2011-10-11 | Discharge: 2011-10-11 | Disposition: A | Discharge status: Home / Self Care | Payer: Managed Care, Other (non HMO) | Source: Ambulatory Visit | Attending: Radiation Oncology | Admitting: Radiation Oncology

## 2011-10-11 ENCOUNTER — Encounter: Payer: Self-pay | Admitting: Radiation Oncology

## 2011-10-11 ENCOUNTER — Ambulatory Visit: Payer: Self-pay | Admitting: Oncology

## 2011-10-11 VITALS — BP 115/77 | HR 63 | Temp 97.6°F | Wt 261.4 lb

## 2011-10-11 DIAGNOSIS — C770 Secondary and unspecified malignant neoplasm of lymph nodes of head, face and neck: Secondary | ICD-10-CM

## 2011-10-11 MED ORDER — SUCRALFATE 1 GM/10ML PO SUSP: 1.0000 g | Freq: Four times a day (QID) | ORAL | Status: DC

## 2011-10-11 MED ORDER — BIAFINE EX EMUL
CUTANEOUS | Status: DC | PRN
  Administered 2011-10-11: 2 via TOPICAL

## 2011-10-11 NOTE — Telephone Encounter (Signed)
Pt calling to report two "water blisters" have developed near steri-strips.  The week-end went well; continuing to pack wound as instructed.  Pt asking best disposition of the blisters.  Advised to not rupture them, but if they burst and drain cover with DSD.  They are benign.  Call to report if they develop purulent drainage.

## 2011-10-11 NOTE — Progress Notes (Signed)
   Weekly Management Note: Head and Neck Cancer  Current Dose:   1200cGy  Projected Dose:  7000cGy   Narrative:  The patient presents for routine under treatment assessment.  CBCT/MVCT images/Port film x-rays were reviewed.  The chart was checked. The patient is still recovering from his peg tube infection/abdominal surgery. His wife is changing his dressings for him. He has no significant side acute effects from radiotherapy thus far. However, the patient's wife has noted that the nodal mass in his left neck has become softer.  Dr. Gaylyn Rong has spoken with the patient's surgeon and will be delaying chemotherapy until the second week of May, hopefully by then his healing will be more complete.  Physical Findings: Weight: 261 lb 6.4 oz (118.57 kg). No oral pharyngeal mucositis thus far. No significant skin changes thus far. He continues to have a palpable left  neck mass, somewhat softer than last week.  CBC    Component Value Date/Time   WBC 8.4 09/27/2011 1415   WBC 8.0 09/24/2011 0450   RBC 4.28 09/27/2011 1415   RBC 4.30 09/24/2011 0450   HGB 12.7* 09/27/2011 1415   HGB 12.8* 09/24/2011 0450   HCT 37.6* 09/27/2011 1415   HCT 37.5* 09/24/2011 0450   PLT 280 09/27/2011 1415   PLT 274 09/24/2011 0450   MCV 87.9 09/27/2011 1415   MCV 87.2 09/24/2011 0450   MCH 29.7 09/27/2011 1415   MCH 29.8 09/24/2011 0450   MCHC 33.8 09/27/2011 1415   MCHC 34.1 09/24/2011 0450   RDW 12.6 09/27/2011 1415   RDW 12.4 09/24/2011 0450   LYMPHSABS 1.5 09/27/2011 1415   MONOABS 0.8 09/27/2011 1415   EOSABS 0.4 09/27/2011 1415   BASOSABS 0.0 09/27/2011 1415    CMP     Component Value Date/Time   NA 138 09/27/2011 1415   K 4.0 09/27/2011 1415   CL 98 09/27/2011 1415   CO2 30 09/27/2011 1415   GLUCOSE 97 09/27/2011 1415   BUN 11 09/27/2011 1415   CREATININE 0.75 09/27/2011 1415   CALCIUM 9.0 09/27/2011 1415   PROT 6.3 09/27/2011 1415   ALBUMIN 3.7 09/27/2011 1415   AST 27 09/27/2011 1415   ALT 25 09/27/2011 1415   ALKPHOS 116  09/27/2011 1415   BILITOT 0.6 09/27/2011 1415   GFRNONAA >90 09/24/2011 0450   GFRAA >90 09/24/2011 0450     Impression:  The patient is tolerating radiotherapy.  Plan:  Continue radiotherapy as planned. A prescription for sucralfate was provided today in case he develops a sore throat over the next week.

## 2011-10-11 NOTE — Progress Notes (Addendum)
Soreness at PEG insertion site.  No throat nor mouth pain.  Reports that he had severe infection at peg insertion site which required removal then surgical procedure to reinsert.  Reports that during treatment he smells a burning flesh and bleech smell. Which last a few minutes during his treatment.     Post-simulation Education for treatment to the Head and Neck region.  Given Radiation Therapy and You Booklet.  Pages turned down related to side effect profile - fatigue, mouth/throat pain - difficulty swallowing, nausea and vomiting, Skin Care management, diet ( Protein and Calories), Weekly Dr. Visit, and registration process.  Wife present for education. Stressed importance of continuing exercises given by the Doctor, general practice, Steven Robinson.   Both stated understanding.  Steven Robinson has been seen by the Mitzie Na and st the present time is not using his PEG tube.  Flushing tube daily.

## 2011-10-12 ENCOUNTER — Encounter: Payer: Self-pay | Admitting: Nutrition

## 2011-10-12 ENCOUNTER — Ambulatory Visit: Payer: Self-pay

## 2011-10-12 ENCOUNTER — Ambulatory Visit
Admission: RE | Admit: 2011-10-12 | Discharge: 2011-10-12 | Disposition: A | Discharge status: Home / Self Care | Payer: Managed Care, Other (non HMO) | Source: Ambulatory Visit | Attending: Radiation Oncology | Admitting: Radiation Oncology

## 2011-10-13 ENCOUNTER — Ambulatory Visit
Admission: RE | Admit: 2011-10-13 | Discharge: 2011-10-13 | Disposition: A | Discharge status: Home / Self Care | Payer: Managed Care, Other (non HMO) | Source: Ambulatory Visit | Attending: Radiation Oncology | Admitting: Radiation Oncology

## 2011-10-14 ENCOUNTER — Ambulatory Visit
Admission: RE | Admit: 2011-10-14 | Discharge: 2011-10-14 | Disposition: A | Discharge status: Home / Self Care | Payer: Managed Care, Other (non HMO) | Source: Ambulatory Visit | Attending: Radiation Oncology | Admitting: Radiation Oncology

## 2011-10-15 ENCOUNTER — Ambulatory Visit
Admission: RE | Admit: 2011-10-15 | Discharge: 2011-10-15 | Disposition: A | Discharge status: Home / Self Care | Payer: Managed Care, Other (non HMO) | Source: Ambulatory Visit | Attending: Radiation Oncology | Admitting: Radiation Oncology

## 2011-10-18 ENCOUNTER — Other Ambulatory Visit: Payer: Self-pay | Admitting: Certified Registered Nurse Anesthetist

## 2011-10-18 ENCOUNTER — Ambulatory Visit: Payer: Managed Care, Other (non HMO) | Admitting: Nutrition

## 2011-10-18 ENCOUNTER — Ambulatory Visit
Admission: RE | Admit: 2011-10-18 | Discharge: 2011-10-18 | Disposition: A | Discharge status: Home / Self Care | Payer: Managed Care, Other (non HMO) | Source: Ambulatory Visit | Attending: Radiation Oncology | Admitting: Radiation Oncology

## 2011-10-18 ENCOUNTER — Telehealth: Payer: Self-pay | Admitting: *Deleted

## 2011-10-18 ENCOUNTER — Ambulatory Visit: Payer: Medicaid - Dental

## 2011-10-18 ENCOUNTER — Encounter: Payer: Self-pay | Admitting: Radiation Oncology

## 2011-10-18 ENCOUNTER — Ambulatory Visit: Payer: Managed Care, Other (non HMO) | Attending: Radiation Oncology

## 2011-10-18 VITALS — BP 108/70 | HR 68 | Temp 97.0°F | Resp 20 | Wt 259.1 lb

## 2011-10-18 DIAGNOSIS — R1313 Dysphagia, pharyngeal phase: Secondary | ICD-10-CM | POA: Insufficient documentation

## 2011-10-18 DIAGNOSIS — R1310 Dysphagia, unspecified: Secondary | ICD-10-CM

## 2011-10-18 DIAGNOSIS — C770 Secondary and unspecified malignant neoplasm of lymph nodes of head, face and neck: Secondary | ICD-10-CM

## 2011-10-18 DIAGNOSIS — IMO0001 Reserved for inherently not codable concepts without codable children: Secondary | ICD-10-CM | POA: Insufficient documentation

## 2011-10-18 MED ORDER — MAGIC MOUTHWASH W/LIDOCAINE: 10.0000 mL | Freq: Four times a day (QID) | ORAL | Status: DC | PRN

## 2011-10-18 NOTE — Progress Notes (Signed)
I received a phone call for Steven Robinson asking if I could find some time to meet with him today.  I did work Steven Robinson into my schedule and noted that he has lost 9% of his body weight in the last month.  Weight was documented as 259 pounds which is down from his usual body weight of 285 pounds.  The patient states that he had some issues with his feeding tube placement and had to have it surgically placed, and he had a 5-day hospital admission.  He developed an infection and he is still waiting for his surgical site to heal.  He states that his chemotherapy is being postponed.  He tells me that he is eating 3 meals with a very small snack in the afternoon and working really hard at getting adequate calories and protein in.  However, dietary recall reveals patient is consuming approximately 60% of his minimum estimated calorie and protein needs.  The patient now meets criteria for severe malnutrition in the context of acute illness secondary to 9% weight loss from usual body weight in 1 month and less than 50% of his energy and protein needs for the last 3 weeks.  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   Diagnosis of food and nutrition-related knowledge deficit continues.  INTERVENTION:  I have educated the patient to increase the calorie content of his meals and snacks.  I have encouraged him to add a minimum of 2 snacks a day to total 6 mini meals and snacks throughout the day. I have given him specific instructions on foods he could incorporate into his meals and snacks that will add extra protein calories.  He is to continue protein powder which he tolerates pretty well and it provides an additional 24 g of protein per scoop.  I have educated him on how to deal with taste alterations which he is beginning to experience.  He is also beginning to  experience sore throat and difficulty swallowing.  I have encouraged soft moist foods that will help him to continue to consume adequate calories and protein.  I have encouraged him to ask his physician regarding adding a multivitamin or supplemental vitamin C and zinc to help promote healing if he is not healing as expected.  I have provided additional fact sheets for the patient to refer to.  MONITORING/EVALUATION/GOALS:  The patient will tolerate an increase in calories and protein to promote healing and minimize further weight loss.  If the patient is able to increase oral intake, we will hold off on tube feeding.  If he is unable to maintain his present weight, I will initiate tube feeding.  NEXT VISIT:  Thursday, May 9th, after radiation therapy.  ______________________________ Zenovia Jarred, RD, LDN Clinical Nutrition Specialist BN/MEDQ  D:  10/18/2011  T:  10/18/2011  Job:  [id number]

## 2011-10-18 NOTE — Progress Notes (Signed)
   Weekly Management Note: Head and neck cancer unknown primary Current Dose:  2200 cGy  Projected Dose: 7000 cGy   Narrative:  The patient presents for routine under treatment assessment.  CBCT/MVCT images/Port film x-rays were reviewed.  The chart was checked. He is developing odynophagia. He has soreness in his mouth. Sense of taste is poor. Food does not taste good. Sense of smell is heightened. He is still taking all of his nutrition by mouth. Has not started chemotherapy yet; has followup with the surgeon and Dr. Gaylyn Rong later this week  Physical Findings: Weight: 259 lb 1.6 oz (117.527 kg). He is in no acute distress. Well-nourished and well-appearing. His oropharynx is notable for patchy mucositis over the soft palate. No thrush is visible. Mucous membranes are moist. His neck exam is notable for obvious decrease in the size of the left cervical nodal mass. He continues to be healing along the abdominal scars from his peg tube surgery; there is a small amount of adipose tissue oozing from one of the scars. No signs of infection  Impression:  The patient is tolerating radiotherapy with good response thus far.  Plan:  Continue radiotherapy as planned. Continue Carafate. I've given him a prescription for Magic mouthwash with lidocaine. He also is oxycodone on hand in case it is needed. He knows that he may need some laxatives for constipation while taking narcotics. Followup with the other specialists as above.

## 2011-10-18 NOTE — Telephone Encounter (Signed)
Pt called to ask about changing his appt time on 5/08.  He has surgeon appt at 9 am and Dr. Gaylyn Rong is at 8:30am.  POF sent to scheduler to r/s pt's appt to later same day.  Called pt back and instructed him to call Scheduling tomorrow to make sure his appt gets r/s to later in day.  Pt verbalized understanding.

## 2011-10-18 NOTE — Progress Notes (Signed)
Patient alert,oriented x 3, no c/o except sore throat now, using caraafte  Not using biafine cream,erythema now on neck,suggested to start using daily after rad tx, flushing peg tube with water, still eating shrimp,crossoint rools ok 11:40 AM

## 2011-10-19 ENCOUNTER — Ambulatory Visit
Admission: RE | Admit: 2011-10-19 | Discharge: 2011-10-19 | Disposition: A | Discharge status: Home / Self Care | Payer: Managed Care, Other (non HMO) | Source: Ambulatory Visit | Attending: Radiation Oncology | Admitting: Radiation Oncology

## 2011-10-19 ENCOUNTER — Encounter: Payer: Self-pay | Admitting: Nutrition

## 2011-10-19 ENCOUNTER — Ambulatory Visit: Payer: Self-pay

## 2011-10-20 ENCOUNTER — Ambulatory Visit (INDEPENDENT_AMBULATORY_CARE_PROVIDER_SITE_OTHER): Payer: Managed Care, Other (non HMO) | Admitting: General Surgery

## 2011-10-20 ENCOUNTER — Other Ambulatory Visit (HOSPITAL_BASED_OUTPATIENT_CLINIC_OR_DEPARTMENT_OTHER): Payer: Managed Care, Other (non HMO) | Admitting: Lab

## 2011-10-20 ENCOUNTER — Other Ambulatory Visit: Payer: Self-pay | Admitting: Lab

## 2011-10-20 ENCOUNTER — Ambulatory Visit (HOSPITAL_BASED_OUTPATIENT_CLINIC_OR_DEPARTMENT_OTHER): Payer: Managed Care, Other (non HMO) | Admitting: Oncology

## 2011-10-20 ENCOUNTER — Ambulatory Visit
Admission: RE | Admit: 2011-10-20 | Discharge: 2011-10-20 | Disposition: A | Discharge status: Home / Self Care | Payer: Managed Care, Other (non HMO) | Source: Ambulatory Visit | Attending: Radiation Oncology | Admitting: Radiation Oncology

## 2011-10-20 ENCOUNTER — Ambulatory Visit: Payer: Self-pay | Admitting: Oncology

## 2011-10-20 VITALS — BP 120/80 | HR 84 | Temp 97.4°F | Resp 18 | Wt 256.5 lb

## 2011-10-20 DIAGNOSIS — B20 Human immunodeficiency virus [HIV] disease: Secondary | ICD-10-CM

## 2011-10-20 DIAGNOSIS — Z931 Gastrostomy status: Secondary | ICD-10-CM

## 2011-10-20 DIAGNOSIS — E440 Moderate protein-calorie malnutrition: Secondary | ICD-10-CM

## 2011-10-20 DIAGNOSIS — C760 Malignant neoplasm of head, face and neck: Secondary | ICD-10-CM

## 2011-10-20 DIAGNOSIS — K1230 Oral mucositis (ulcerative), unspecified: Secondary | ICD-10-CM

## 2011-10-20 DIAGNOSIS — K121 Other forms of stomatitis: Secondary | ICD-10-CM

## 2011-10-20 LAB — CBC WITH DIFFERENTIAL/PLATELET
Basophils Absolute: 0 10*3/uL (ref 0.0–0.1)
EOS%: 4.3 % (ref 0.0–7.0)
Eosinophils Absolute: 0.3 10*3/uL (ref 0.0–0.5)
HCT: 37 % — ABNORMAL LOW (ref 38.4–49.9)
HGB: 12.7 g/dL — ABNORMAL LOW (ref 13.0–17.1)
MCH: 29.2 pg (ref 27.2–33.4)
MCV: 85.1 fL (ref 79.3–98.0)
NEUT#: 4.4 10*3/uL (ref 1.5–6.5)
NEUT%: 72.5 % (ref 39.0–75.0)
lymph#: 0.6 10*3/uL — ABNORMAL LOW (ref 0.9–3.3)

## 2011-10-20 LAB — COMPREHENSIVE METABOLIC PANEL
AST: 20 U/L (ref 0–37)
Albumin: 4.2 g/dL (ref 3.5–5.2)
BUN: 14 mg/dL (ref 6–23)
Calcium: 9 mg/dL (ref 8.4–10.5)
Chloride: 100 mEq/L (ref 96–112)
Creatinine, Ser: 0.8 mg/dL (ref 0.50–1.35)
Glucose, Bld: 89 mg/dL (ref 70–99)
Potassium: 4 mEq/L (ref 3.5–5.3)

## 2011-10-20 NOTE — Progress Notes (Signed)
Subjective:     Patient ID: Steven Robinson, male   DOB: Mar 03, 1951, 61 y.o.   MRN: [id number]  HPI Patient is status post open gastrostomy after dislodgment of the IR placed gastrostomy tube. He's been doing packing of 2 spots on the wound. The lower one is closed. He noted a little bit of tissue sticking out of the one in the middle. He has begun radiation treatments and is having some oral complaints due to that.  Review of Systems     Objective:   Physical Exam Abdomen soft nontender. Some Steri-Strips remain. He is mild tape blisters. G-tube site has a small spot of granulation tissue that was treated with silver nitrate. There was a larger piece of granulation tissue from the middle of the wound which was treated liberally with silver nitrate. A tiny spot of granulation tissue at the bottom of the lung was also treated. A dressing was applied.    Assessment:     Doing well status post G-tube placement    Plan:         Stable to begin chemotherapy next week. He will call us if he develops further issues with granulation tissue. He will keep the area covered until completely scabs over. We will see him back as needed.

## 2011-10-20 NOTE — Progress Notes (Signed)
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 He had positive PET scan in the left base of the tongue but direct laryngoscopy and biopsy was negative.   PAST THERAPY: FNA only   CURRENT THERAPY:  Started daily radiation on 10/12/2011.  Chemotherapy has been on hold due to PEG tube infection.   INTERVAL HISTORY: Steven Robinson 61 y.o. male returns for regular follow up with his wife for evaluation prior to starting chemotherapy soon.  He started on radiation two weeks ago.  He has been having mucositis for which he is taking magic mouth wash prn and sucralfate.  He still has stable left neck swelling.  He has developed some skin rash due to radiation but without skin break through.  He has had a decrease in appetite since starting radiation.  He has lost about 20-lb since the start of radiation.  He has mild fatigue; however, he is still independent of activities of daily living.  The current J-tube has been doing well.  He flushes it everyday but has not been using for feeding purpose.  He denies pain, erythema, purulent discharge from the removed PEG tube site.   Patient denies headache, visual changes, confusion, drenching night sweats, odynophagia, dysphagia, nausea vomiting, jaundice, chest pain, palpitation, shortness of breath, dyspnea on exertion, productive cough, gum bleeding, epistaxis, hematemesis, hemoptysis, abdominal pain, abdominal swelling, early satiety, melena, hematochezia, hematuria, skin rash, spontaneous bleeding, joint swelling, joint pain, heat or cold intolerance, bowel bladder incontinence, back pain, focal motor weakness, paresthesia, depression, suicidal or homocidal ideation, feeling hopelessness.   Past Medical History  Diagnosis Date  . Hyperlipoproteinemia   . Obstructive sleep apnea     on C-pap.   [name]  GERD (gastroesophageal reflux disease)     chronic  . Hx of colonic polyps   . 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 Vertigo     2/2 intersitial neuritis.   [name] Hearing loss of left ear   . BPH (benign prostatic hyperplasia)   . 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 Hyperlipidemia     Past Surgical History  Procedure Date  . Hernia repair 1990    Left  . Fine needle aspiration 08/26/11     LEFT NECK FNA, LYMPH NODE, METASTATIC SQUAMOUS CELL CARCINOMA  . Gastrostomy 09/24/2011    Procedure: GASTROSTOMY;  Surgeon: Liz Malady, MD;  Location: [name] OR;  Service: General;  Laterality: N/A;  open G-tube placement procedure start @1219   . Rigid esophagoscopy 09/24/2011    Procedure: RIGID ESOPHAGOSCOPY;  Surgeon: Suzanna Obey, MD;  Location: [facility] OR;  Service: ENT;  Laterality: N/A;  Esophagoscopy procedure end @1210     Current Outpatient Prescriptions  Medication Sig Dispense Refill  . ALPRAZolam (XANAX) 0.5 MG tablet prn      . Alum & Mag Hydroxide-Simeth (MAGIC MOUTHWASH W/LIDOCAINE) SOLN Take 10 mLs by mouth 4 (four) times daily as needed (alternate with carafate, take about 30 min before meals and bedtime).  480 mL  5  . aspirin EC 81 MG tablet Take 81 mg by mouth daily.      [name] dexlansoprazole (DEXILANT) 60 MG capsule Take 60 mg by mouth daily.      . diphenhydrAMINE (BENADRYL) 25 MG tablet Take 25 mg by mouth at bedtime.       [name] LOVAZA  1 G capsule       . rosuvastatin (CRESTOR) 40 MG tablet Take 40 mg by mouth daily.      . sucralfate (CARAFATE) 1 GM/10ML suspension Take 10 mLs (1 g total) by mouth 4 (four) times daily.  420 mL  5  . Tamsulosin HCl (FLOMAX) 0.4 MG CAPS Take 0.4 mg by mouth daily.       [name] zolpidem (AMBIEN) 10 MG tablet Take 10 mg by mouth at bedtime.       [name] HYDROCODONE-ACETAMINOPHEN PO Take by mouth. 10/325 take one tab q4-6 hrs prn        ALLERGIES:  is allergic to cyclobenzaprine hcl.  REVIEW OF SYSTEMS:  The rest of the 14-point review of system was  negative.   Filed Vitals:   10/20/11 1230  BP: 119/81  Pulse: 78  Temp: 97.1 F (36.2 C)   Wt Readings from Last 3 Encounters:  10/20/11 253 lb 14.4 oz (115.168 kg)  10/20/11 256 lb 8 oz (116.348 kg)  10/18/11 259 lb 1.6 oz (117.527 kg)   ECOG Performance status: 0-1  PHYSICAL EXAMINATION:   General: well-nourished in no acute distress. Eyes: no scleral icterus. ENT: There were no oropharyngeal lesions. Neck was without thyromegaly. Lymphatics: Positive for multiple nodes matted together in the left level 2-3 nodes. Negative for supraclavicular or axillary adenopathy. Respiratory: lungs were clear bilaterally without wheezing or crackles. Cardiovascular: Regular rate and rhythm, S1/S2, without murmur, rub or gallop. There was no pedal edema. GI: abdomen was soft, flat, nontender, nondistended, without organomegaly. RUQ removed PEG tube site has healed nicely with granulation tissue without erythema, purulent discharge, or pain on palpation. He has a surgical G-tube. Vertical abdominal wound has healed completely as well.  Muscoloskeletal: no spinal tenderness of palpation of vertebral spine. Skin exam was without echymosis, petichae. Neuro exam was nonfocal. Patient was able to get on and off exam table without assistance. Gait was normal. Patient was alerted and oriented. Attention was good. Language was appropriate. Mood was normal without depression. Speech was not pressured. Thought content was not tangential.     LABORATORY/RADIOLOGY DATA:  Lab Results  Component Value Date   WBC 6.1 10/20/2011   HGB 12.7* 10/20/2011   HCT 37.0* 10/20/2011   PLT 171 10/20/2011   GLUCOSE 97 09/27/2011   ALKPHOS 116 09/27/2011   ALT 25 09/27/2011   AST 27 09/27/2011   NA 138 09/27/2011   K 4.0 09/27/2011   CL 98 09/27/2011   CREATININE 0.75 09/27/2011   BUN 11 09/27/2011   CO2 30 09/27/2011   INR 0.91 09/13/2011     ASSESSMENT AND PLAN:   1. History of smoking: he quit in college.   2. History of  drinking a few glasses of wine a day: He also quit this 3 years ago.   3. Obstructive sleep apnea: He is on C-pap.   4. BPH: Symptoms well controlled with Flomax.   5. Newly diagnosed cT1 N2b M0 SCC; HPV equivocal.  - His wound has healed quite nicely.  His surgeon and patient himself preferred to wait until next week for further healing prior to starting chemo.  I raised the concern that the longer we wait to start on chemo for concurrent chemoradiation, there may be less radiation sensitization effect of chemo on radiation.   - I again discussed with Mr. Nall and his wife potential side effects of Cisplatin which include but not limited to nausea vomiting, alopecia, fatigue, mucositis, ototoxicity, nephrotoxicity,  abnormal electrolytes, cytopenia, risk of bleeding and infection.  He again wanted to wait until next week; expressed informed understanding and wished to start chemo next week. - With his J-tube and past PEG tube infection, I recommended weekly cisplatin rather than q3wk dosing to decrease the risk of side effects and higher chance of more accumulative chemotherapy at the end.   6.  Mucositis:  I advised patient to perform mouth rinses with salt/baking soda alternating with diluted hydrogen peroxide to decrease the thick phlegm and magic mouth wash prn.  I advised him to go ahead and take Lortab elixir prn.  7.  Moderate calorie/protein malnutrition:  I advised patient to increase his oral intake and when he has more mucositis to start PEG tube placement.  He has appointment to see Vernell Leep from nutrition.   8.  Nausea/vomiting prophy:  He has Compazine/Zofran/Ativan prn.        The length of time of the face-to-face encounter was . More than 50% of time was spent counseling and coordination of care.

## 2011-10-21 ENCOUNTER — Ambulatory Visit: Payer: Managed Care, Other (non HMO) | Admitting: Nutrition

## 2011-10-21 ENCOUNTER — Ambulatory Visit
Admission: RE | Admit: 2011-10-21 | Discharge: 2011-10-21 | Disposition: A | Discharge status: Home / Self Care | Payer: Managed Care, Other (non HMO) | Source: Ambulatory Visit | Attending: Radiation Oncology | Admitting: Radiation Oncology

## 2011-10-21 ENCOUNTER — Ambulatory Visit: Payer: Self-pay

## 2011-10-21 DIAGNOSIS — C770 Secondary and unspecified malignant neoplasm of lymph nodes of head, face and neck: Secondary | ICD-10-CM

## 2011-10-21 MED ORDER — ENSURE PLUS PO LIQD: ORAL | Status: DC

## 2011-10-21 NOTE — Progress Notes (Signed)
Mr. Skaggs continues to have documented weight loss.  Weight was documented as 253.9 pounds, down from 259 pounds, 2 days ago.  He is eating very small amounts of protein shakes and soft moist foods; however, oral intake remains inadequate, he reports drinking Boost Plus twice a day.  NUTRITION DIAGNOSIS: 1. Inadequate oral intake continues. 2. Food and nutrition related knowledge deficit continues.  INTERVENTION:  I have initiated bolus tube feedings for the patient to utilize Ensure Plus 1 can q.i.d. with 60 cc of free water before and after each bolus feeding.  He is to continue to try to consume Ensure Plus or Boost Plus at least b.i.d. by mouth along with other soft moist foods to provide minimum estimated needs.  In addition, he is to continue protein powder 1 scoop per day.  MONITORING/EVALUATION/GOALS:  The patient will tolerate bolus tube feeding and oral intake to minimize further weight loss.  Tube feedings will be advanced as oral intake continues to decline.  Tube feeding orders will be written and his home care agency will be notified.  NEXT VISIT:  Monday, May 13th during chemotherapy.    ______________________________ Zenovia Jarred, RD, LDN Clinical Nutrition Specialist BN/MEDQ  D:  10/21/2011  T:  10/21/2011  Job:  [id number]

## 2011-10-22 ENCOUNTER — Telehealth: Payer: Self-pay | Admitting: Oncology

## 2011-10-22 ENCOUNTER — Telehealth (INDEPENDENT_AMBULATORY_CARE_PROVIDER_SITE_OTHER): Payer: Self-pay | Admitting: General Surgery

## 2011-10-22 ENCOUNTER — Ambulatory Visit
Admission: RE | Admit: 2011-10-22 | Discharge: 2011-10-22 | Disposition: A | Discharge status: Home / Self Care | Payer: Managed Care, Other (non HMO) | Source: Ambulatory Visit | Attending: Radiation Oncology | Admitting: Radiation Oncology

## 2011-10-22 NOTE — Telephone Encounter (Signed)
called pt and informed him that tx on 05/13 and 05/20 could not be moved, but his other appts where moved to tues. Pt will pick up schedule 10/22/2011

## 2011-10-22 NOTE — Telephone Encounter (Signed)
Patient called status post PEG tube placement. Just started using PEG tube yesterday for feeding. Had been flushing, but yesterday used x 4 for feeding. Working fine, but very sore today. No sharp pains. Small amount of oozing around tube, but this has been going on for weeks. Small amount of redness right around tube. Redness not spread out from right at tube. No warmth to skin. No fevers. I told them this sounded normal since he just started using the tube and has been moving/manipulating it more. I offered appt to have MD check site. They declined appt for now, but will call if needed. I informed them to call if redness started spreading around tube, skin becomes hot, drainage around tube increases, if he has fevers or sharp pains at tube site. Also informed to call if tube stops functioning or pain becomes worse. He agreed and will call if needed.

## 2011-10-25 ENCOUNTER — Ambulatory Visit: Payer: Self-pay

## 2011-10-25 ENCOUNTER — Encounter: Payer: Self-pay | Admitting: Radiation Oncology

## 2011-10-25 ENCOUNTER — Ambulatory Visit (HOSPITAL_BASED_OUTPATIENT_CLINIC_OR_DEPARTMENT_OTHER): Payer: Managed Care, Other (non HMO)

## 2011-10-25 ENCOUNTER — Ambulatory Visit: Payer: Managed Care, Other (non HMO) | Admitting: Nutrition

## 2011-10-25 ENCOUNTER — Ambulatory Visit
Admission: RE | Admit: 2011-10-25 | Discharge: 2011-10-25 | Disposition: A | Discharge status: Home / Self Care | Payer: Managed Care, Other (non HMO) | Source: Ambulatory Visit | Attending: Radiation Oncology | Admitting: Radiation Oncology

## 2011-10-25 VITALS — BP 154/89 | HR 87 | Resp 18 | Wt 255.4 lb

## 2011-10-25 DIAGNOSIS — C800 Disseminated malignant neoplasm, unspecified: Secondary | ICD-10-CM

## 2011-10-25 DIAGNOSIS — Z5111 Encounter for antineoplastic chemotherapy: Secondary | ICD-10-CM

## 2011-10-25 DIAGNOSIS — C770 Secondary and unspecified malignant neoplasm of lymph nodes of head, face and neck: Secondary | ICD-10-CM

## 2011-10-25 MED ORDER — POTASSIUM CHLORIDE 2 MEQ/ML IV SOLN
Freq: Once | INTRAVENOUS | Status: AC
  Administered 2011-10-25: 09:00:00 via INTRAVENOUS
  Filled 2011-10-25: qty 10

## 2011-10-25 MED ORDER — SODIUM CHLORIDE 0.9 % IV SOLN
Freq: Once | INTRAVENOUS | Status: AC
  Administered 2011-10-25: 09:00:00 via INTRAVENOUS

## 2011-10-25 MED ORDER — SODIUM CHLORIDE 0.9 % IV SOLN
40.5000 mg/m2 | Freq: Once | INTRAVENOUS | Status: AC
  Administered 2011-10-25: 100 mg via INTRAVENOUS
  Filled 2011-10-25: qty 100

## 2011-10-25 MED ORDER — DEXAMETHASONE SODIUM PHOSPHATE 4 MG/ML IJ SOLN
20.0000 mg | Freq: Once | INTRAMUSCULAR | Status: AC
  Administered 2011-10-25: 20 mg via INTRAVENOUS

## 2011-10-25 MED ORDER — ONDANSETRON 16 MG/50ML IVPB (CHCC)
16.0000 mg | Freq: Once | INTRAVENOUS | Status: AC
  Administered 2011-10-25: 16 mg via INTRAVENOUS

## 2011-10-25 NOTE — Progress Notes (Signed)
Patient presents to the clinic today accompanied by his wife and daughter for an under treat visit with Dr. Basilio Cairo. Patient is alert and oriented to person, place, and time. No distress noted. Steady gait noted. Pleasant affect noted. Patient reports the sores in his mouth are very sore. Patient reports that today was his first day of chemo. Patient reports that he has chemotherapy next Monday then every Tuesday after that. Patient reports using Biotene, peroxide rinse and baking soda rinse. Patient denies using magic mouth wash and carafate as often as he should. Patient reports that he is only able to drink water. Patient reports taking all nourishment via PEG tube. Patient reports instilling eight cans of Boost Plus per day. Three pound weight gain noted since 10/20/2011. Patient reports his left nostril is dry. Patient reports rope like saliva. Patient reports dry mouth. Reported all findings to Dr. Basilio Cairo.

## 2011-10-25 NOTE — Progress Notes (Addendum)
   Weekly Management Note: Head and Neck Cancer, Unknown Primary Current Dose:  3200 cGy  Projected Dose:  7000 cGy   Narrative:  The patient presents for routine under treatment assessment.  CBCT/MVCT images/Port film x-rays were reviewed.  The chart was checked. He started cis-platinum today. He has a sore throat and is using Carafate and Magic mouthwash. He is Lortab elixir to use as needed as well. He reports thickened secretions. He is starting bolus feedings through his PEG. His daughter recently moved back here from [place].  Physical Findings: Weight: 255 lb 6.4 oz (115.849 kg). No acute distress. In good spirits. He has patchy mucositis throughout his oropharynx. Mucous membranes are moist. The neck mass in the left cervical region has almost disappeared. The wound over his abdomen continues to heal  CBC    Component Value Date/Time   WBC 6.1 10/20/2011 1215   WBC 8.0 09/24/2011 0450   RBC 4.35 10/20/2011 1215   RBC 4.30 09/24/2011 0450   HGB 12.7* 10/20/2011 1215   HGB 12.8* 09/24/2011 0450   HCT 37.0* 10/20/2011 1215   HCT 37.5* 09/24/2011 0450   PLT 171 10/20/2011 1215   PLT 274 09/24/2011 0450   MCV 85.1 10/20/2011 1215   MCV 87.2 09/24/2011 0450   MCH 29.2 10/20/2011 1215   MCH 29.8 09/24/2011 0450   MCHC 34.3 10/20/2011 1215   MCHC 34.1 09/24/2011 0450   RDW 12.6 10/20/2011 1215   RDW 12.4 09/24/2011 0450   LYMPHSABS 0.6* 10/20/2011 1215   MONOABS 0.8 10/20/2011 1215   EOSABS 0.3 10/20/2011 1215   BASOSABS 0.0 10/20/2011 1215    CMP     Component Value Date/Time   NA 137 10/20/2011 1215   K 4.0 10/20/2011 1215   CL 100 10/20/2011 1215   CO2 26 10/20/2011 1215   GLUCOSE 89 10/20/2011 1215   BUN 14 10/20/2011 1215   CREATININE 0.80 10/20/2011 1215   CALCIUM 9.0 10/20/2011 1215   PROT 6.6 10/20/2011 1215   ALBUMIN 4.2 10/20/2011 1215   AST 20 10/20/2011 1215   ALT 19 10/20/2011 1215   ALKPHOS 61 10/20/2011 1215   BILITOT 0.9 10/20/2011 1215   GFRNONAA >90 09/24/2011 0450   GFRAA >90 09/24/2011 0450     Impression:  The patient is tolerating radiotherapy. Excellent clinical response to radiation alone so far. Started concurrent chemotherapy today as his PEG tube complications are resolving.  Plan:  Continue radiotherapy as planned. The patient understands how to use his various medications for pain control and antiemesis. Very pleased with how he is doing.

## 2011-10-25 NOTE — Patient Instructions (Signed)
[facility] Discharge Instructions for Patients Receiving Chemotherapy  Today you received the following chemotherapy agents Cisplatin To help prevent nausea and vomiting after your treatment, we encourage you to take your nausea medication as prescribed.  If you develop nausea and vomiting that is not controlled by your nausea medication, call the clinic. If it is after clinic hours your family physician or the after hours number for the clinic or go to the Emergency Department.   BELOW ARE SYMPTOMS THAT SHOULD BE REPORTED IMMEDIATELY:  *FEVER GREATER THAN 100.5 F  *CHILLS WITH OR WITHOUT FEVER  NAUSEA AND VOMITING THAT IS NOT CONTROLLED WITH YOUR NAUSEA MEDICATION  *UNUSUAL SHORTNESS OF BREATH  *UNUSUAL BRUISING OR BLEEDING  TENDERNESS IN MOUTH AND THROAT WITH OR WITHOUT PRESENCE OF ULCERS  *URINARY PROBLEMS  *BOWEL PROBLEMS  UNUSUAL RASH Items with * indicate a potential emergency and should be followed up as soon as possible.  One of the nurses will contact you 24 hours after your treatment. Please let the nurse know about any problems that you may have experienced. Feel free to call the clinic you have any questions or concerns. The clinic phone number is [phone].   I have been informed and understand all the instructions given to me. I know to contact the clinic, my physician, or go to the Emergency Department if any problems should occur. I do not have any questions at this time, but understand that I may call the clinic during office hours   should I have any questions or need assistance in obtaining follow up care.    __________________________________________  _____________  __________ Signature of Patient or Authorized Representative            Date                   Time    __________________________________________ Nurse's Signature    

## 2011-10-25 NOTE — Progress Notes (Signed)
I spoke to Mr. and Mrs. Merlino today in chemotherapy.  He reports he has been tolerating 8 Boost Plus daily via his feeding tube.  He uses 60 mL of water before each bolus feeding and 100 mL after.  In addition, he reports to drinking approximately 100 ounces of water daily by mouth. He states he has not yet added in the protein powder; however, he intends to start that today.  He is not eating food by mouth at this time.  NUTRITION DIAGNOSIS: 1. Inadequate oral intake continues 2. Food and nutrition related knowledge deficit continues.  INTERVENTION:  The patient is to continue 2 cans of Boost Plus q.i.d. with 60 mL of water before and 100 mL of water after each bolus feeding. He is to add 1 scoop of protein powder in 8 ounces of water well-dissolved and poured into his feeding tube once daily.  The patient will be receiving 2880 calories, 136 g of protein with tube feedings. As far as free water goes, he may be consuming too much water at this point in time.  He did not have recent lab work.  Last panel was done on May 8th and his sodium of 137.  I have asked him to speak with his MD about total free water.  The patient is receiving approximately 5 L of free water total between tube feedings, free water flushes and oral intake.  MONITORING, EVALUATION AND GOALS:  The patient will tolerate bolus tube feeding and free water flushes to meet estimated nutrition needs to minimize further weight loss.  NEXT VISIT:  Monday, May 20th, during chemotherapy.    ______________________________ Zenovia Jarred, RD, LDN Clinical Nutrition Specialist BN/MEDQ  D:  10/25/2011  T:  10/25/2011  Job:  [id number]

## 2011-10-26 ENCOUNTER — Ambulatory Visit
Admission: RE | Admit: 2011-10-26 | Discharge: 2011-10-26 | Disposition: A | Discharge status: Home / Self Care | Payer: Managed Care, Other (non HMO) | Source: Ambulatory Visit | Attending: Radiation Oncology | Admitting: Radiation Oncology

## 2011-10-26 ENCOUNTER — Telehealth: Payer: Self-pay | Admitting: *Deleted

## 2011-10-26 NOTE — Telephone Encounter (Signed)
Called pt to f/u from CDDP chemo given yest.  Pt reports that he tol. well, no nausea, vomiting, constipation or diarrhea.  He reports no fever & that he has been taking his nausea meds & just feels a little sleepy.  He also reports that he is taking in liquids well & ensure 8 cans daly via PEG & some h20 orally.  He knows how to call if he has questions or concerns.

## 2011-10-27 ENCOUNTER — Telehealth: Payer: Self-pay | Admitting: *Deleted

## 2011-10-27 ENCOUNTER — Ambulatory Visit
Admission: RE | Admit: 2011-10-27 | Discharge: 2011-10-27 | Disposition: A | Discharge status: Home / Self Care | Payer: Managed Care, Other (non HMO) | Source: Ambulatory Visit | Attending: Radiation Oncology | Admitting: Radiation Oncology

## 2011-10-27 NOTE — Telephone Encounter (Signed)
Pt left VM stating he has been constipated but resolved this morning after taking dulcolax tabs yesterday.  He wants to know if anything else he can take..  States he is on Tube feedings exclusively at this time.   Called pt back and left Vm instructing he can continue to take stool softeners as directed and/or try Miralax daily via PEG tube. Instructed him to call back if any questions.

## 2011-10-28 ENCOUNTER — Ambulatory Visit
Admission: RE | Admit: 2011-10-28 | Discharge: 2011-10-28 | Disposition: A | Discharge status: Home / Self Care | Payer: Managed Care, Other (non HMO) | Source: Ambulatory Visit | Attending: Radiation Oncology | Admitting: Radiation Oncology

## 2011-10-29 ENCOUNTER — Other Ambulatory Visit (HOSPITAL_BASED_OUTPATIENT_CLINIC_OR_DEPARTMENT_OTHER): Payer: Managed Care, Other (non HMO) | Admitting: Lab

## 2011-10-29 ENCOUNTER — Ambulatory Visit
Admission: RE | Admit: 2011-10-29 | Discharge: 2011-10-29 | Disposition: A | Discharge status: Home / Self Care | Payer: Managed Care, Other (non HMO) | Source: Ambulatory Visit | Attending: Radiation Oncology | Admitting: Radiation Oncology

## 2011-10-29 DIAGNOSIS — C760 Malignant neoplasm of head, face and neck: Secondary | ICD-10-CM

## 2011-10-29 LAB — CBC WITH DIFFERENTIAL/PLATELET
BASO%: 0.6 % (ref 0.0–2.0)
EOS%: 3.7 % (ref 0.0–7.0)
MCH: 29.3 pg (ref 27.2–33.4)
MCHC: 33.7 g/dL (ref 32.0–36.0)
RBC: 4.61 10*6/uL (ref 4.20–5.82)
RDW: 13.3 % (ref 11.0–14.6)
lymph#: 0.4 10*3/uL — ABNORMAL LOW (ref 0.9–3.3)
nRBC: 0 % (ref 0–0)

## 2011-10-29 LAB — BASIC METABOLIC PANEL
CO2: 26 mEq/L (ref 19–32)
Calcium: 8.9 mg/dL (ref 8.4–10.5)
Creatinine, Ser: 0.82 mg/dL (ref 0.50–1.35)
Glucose, Bld: 95 mg/dL (ref 70–99)

## 2011-11-01 ENCOUNTER — Ambulatory Visit
Admission: RE | Admit: 2011-11-01 | Discharge: 2011-11-01 | Disposition: A | Discharge status: Home / Self Care | Payer: Managed Care, Other (non HMO) | Source: Ambulatory Visit | Attending: Radiation Oncology | Admitting: Radiation Oncology

## 2011-11-01 ENCOUNTER — Ambulatory Visit: Payer: Managed Care, Other (non HMO) | Admitting: Nutrition

## 2011-11-01 ENCOUNTER — Ambulatory Visit (HOSPITAL_BASED_OUTPATIENT_CLINIC_OR_DEPARTMENT_OTHER): Payer: Managed Care, Other (non HMO) | Admitting: Oncology

## 2011-11-01 ENCOUNTER — Ambulatory Visit (HOSPITAL_BASED_OUTPATIENT_CLINIC_OR_DEPARTMENT_OTHER): Payer: Managed Care, Other (non HMO)

## 2011-11-01 ENCOUNTER — Encounter: Payer: Self-pay | Admitting: Oncology

## 2011-11-01 VITALS — BP 123/73 | HR 74 | Temp 97.0°F | Wt 249.2 lb

## 2011-11-01 DIAGNOSIS — C801 Malignant (primary) neoplasm, unspecified: Secondary | ICD-10-CM

## 2011-11-01 DIAGNOSIS — C770 Secondary and unspecified malignant neoplasm of lymph nodes of head, face and neck: Secondary | ICD-10-CM

## 2011-11-01 DIAGNOSIS — Z5111 Encounter for antineoplastic chemotherapy: Secondary | ICD-10-CM

## 2011-11-01 DIAGNOSIS — G4733 Obstructive sleep apnea (adult) (pediatric): Secondary | ICD-10-CM

## 2011-11-01 DIAGNOSIS — K1230 Oral mucositis (ulcerative), unspecified: Secondary | ICD-10-CM

## 2011-11-01 DIAGNOSIS — C800 Disseminated malignant neoplasm, unspecified: Secondary | ICD-10-CM

## 2011-11-01 DIAGNOSIS — K121 Other forms of stomatitis: Secondary | ICD-10-CM

## 2011-11-01 DIAGNOSIS — B977 Papillomavirus as the cause of diseases classified elsewhere: Secondary | ICD-10-CM

## 2011-11-01 HISTORY — DX: Oral mucositis (ulcerative), unspecified: K12.30

## 2011-11-01 MED ORDER — ONDANSETRON 16 MG/50ML IVPB (CHCC)
16.0000 mg | Freq: Once | INTRAVENOUS | Status: AC
  Administered 2011-11-01: 16 mg via INTRAVENOUS

## 2011-11-01 MED ORDER — SODIUM CHLORIDE 0.9 % IJ SOLN
3.0000 mL | INTRAMUSCULAR | Status: DC | PRN
  Filled 2011-11-01: qty 10

## 2011-11-01 MED ORDER — DEXAMETHASONE SODIUM PHOSPHATE 4 MG/ML IJ SOLN
20.0000 mg | Freq: Once | INTRAMUSCULAR | Status: AC
  Administered 2011-11-01: 20 mg via INTRAVENOUS

## 2011-11-01 MED ORDER — HEPARIN SOD (PORK) LOCK FLUSH 100 UNIT/ML IV SOLN
500.0000 [IU] | Freq: Once | INTRAVENOUS | Status: DC | PRN
  Filled 2011-11-01: qty 5

## 2011-11-01 MED ORDER — SODIUM CHLORIDE 0.9 % IJ SOLN
10.0000 mL | INTRAMUSCULAR | Status: DC | PRN
  Filled 2011-11-01: qty 10

## 2011-11-01 MED ORDER — HEPARIN SOD (PORK) LOCK FLUSH 100 UNIT/ML IV SOLN
250.0000 [IU] | Freq: Once | INTRAVENOUS | Status: DC | PRN
  Filled 2011-11-01: qty 5

## 2011-11-01 MED ORDER — SODIUM CHLORIDE 0.9 % IV SOLN: Freq: Once | INTRAVENOUS | Status: DC

## 2011-11-01 MED ORDER — POTASSIUM CHLORIDE 2 MEQ/ML IV SOLN
Freq: Once | INTRAVENOUS | Status: AC
  Administered 2011-11-01: 11:00:00 via INTRAVENOUS
  Filled 2011-11-01: qty 10

## 2011-11-01 MED ORDER — CISPLATIN CHEMO INJECTION 100MG/100ML
100.0000 mg | Freq: Once | INTRAVENOUS | Status: AC
  Administered 2011-11-01: 100 mg via INTRAVENOUS
  Filled 2011-11-01: qty 100

## 2011-11-01 MED ORDER — ALTEPLASE 2 MG IJ SOLR
2.0000 mg | Freq: Once | INTRAMUSCULAR | Status: DC | PRN
  Filled 2011-11-01: qty 2

## 2011-11-01 MED ORDER — HYDROCODONE-ACETAMINOPHEN 7.5-500 MG/15ML PO SOLN: 15.0000 mL | Freq: Four times a day (QID) | ORAL | Status: AC | PRN

## 2011-11-01 NOTE — Progress Notes (Signed)
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 He had positive PET scan in the left base of the tongue but direct laryngoscopy and biopsy was negative.   PAST THERAPY: FNA only   CURRENT THERAPY: Started daily radiation on 10/12/2011. Chemotherapy weekly cisplatin was delayed until 10/25/10 due to PEG tube infection.   INTERVAL HISTORY: Steven Robinson 61 y.o. male returns for regular follow up with his wife.  He was interviewed and examined in chemo infusion area today.   He reported having more mucositis despite magic mouth wash, salt/baking soda mouth rinse, and diluted H2O2.  He has not been taking food by mouth due to mucositis pain.  He has not been taking pain med.  He has been using his PEG tube with about 6-8 cans of Boost daily.  His weight continues to decrease.  His performance status is still good.  He is independent of all activities of daily living including feeding his animals.  He denies fever, tinnitus, nausea/vomiting, G-tube erythema or purulent discharge.   Patient denies fatigue, headache, visual changes, confusion, drenching night sweats, palpable lymph node swelling, odynophagia, dysphagia, nausea vomiting, jaundice, chest pain, palpitation, shortness of breath, dyspnea on exertion, productive cough, gum bleeding, epistaxis, hematemesis, hemoptysis, abdominal pain, abdominal swelling, early satiety, melena, hematochezia, hematuria, skin rash, spontaneous bleeding, joint pain, back pain, focal motor weakness, paresthesia,    Past Medical History  Diagnosis Date  . Hyperlipoproteinemia   . Obstructive sleep apnea     on C-pap.   [name] GERD (gastroesophageal reflux disease)     chronic  . Hx of colonic polyps   . 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 Vertigo     2/2 intersitial neuritis.   [name] Hearing  loss of left ear   . BPH (benign prostatic hyperplasia)   . 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 Hyperlipidemia   . Mucositis 11/01/2011    Past Surgical History  Procedure Date  . Hernia repair 1990    Left  . Fine needle aspiration 08/26/11     LEFT NECK FNA, LYMPH NODE, METASTATIC SQUAMOUS CELL CARCINOMA  . Gastrostomy 09/24/2011    Procedure: GASTROSTOMY;  Surgeon: Liz Malady, MD;  Location: [facility] OR;  Service: General;  Laterality: N/A;  open G-tube placement procedure start @1219   . Rigid esophagoscopy 09/24/2011    Procedure: RIGID ESOPHAGOSCOPY;  Surgeon: Suzanna Obey, MD;  Location: [facility] OR;  Service: ENT;  Laterality: N/A;  Esophagoscopy procedure end @1210     Current Outpatient Prescriptions  Medication Sig Dispense Refill  . ALPRAZolam (XANAX) 0.5 MG tablet prn      . Alum & Mag Hydroxide-Simeth (MAGIC MOUTHWASH W/LIDOCAINE) SOLN Take 10 mLs by mouth 4 (four) times daily as needed (alternate with carafate, take about 30 min before meals and bedtime).  480 mL  5  . aspirin EC 81 MG tablet Take 81 mg by mouth daily.      [name] dexlansoprazole (DEXILANT) 60 MG capsule Take 60 mg by mouth daily.      . diphenhydrAMINE (BENADRYL) 25 MG tablet Take 25 mg by mouth at bedtime.       . Ensure Plus (ENSURE PLUS) LIQD Begin one can of Ensure Plus QID with 60 ml free water before and after each bolus feeding.  In addition, either drink or flush tube  with 300 ml free water TID.  If tolerated, increase bolus feedings to 1.5 cans QID on Monday, May 13th. If tolerated, increase to goal of 2 cans Ensure Plus QID.  8 Can  0  . HYDROcodone-acetaminophen (LORTAB) 7.5-500 MG/15ML solution Take 15 mLs by mouth every 6 (six) hours as needed for pain.  480 mL  0  . LORazepam (ATIVAN) 0.5 MG tablet Take 0.5 mg by mouth every 6 (six) hours as needed.      [name] LOVAZA 1 G capsule       . ondansetron (ZOFRAN) 8 MG tablet Take by mouth every 12 (twelve) hours as needed. For N/V after 3rd day after chemotherapy      .  prochlorperazine (COMPAZINE) 10 MG tablet Take 10 mg by mouth every 6 (six) hours as needed.      . rosuvastatin (CRESTOR) 40 MG tablet Take 40 mg by mouth daily.      . sucralfate (CARAFATE) 1 GM/10ML suspension Take 10 mLs (1 g total) by mouth 4 (four) times daily.  420 mL  5  . Tamsulosin HCl (FLOMAX) 0.4 MG CAPS Take 0.4 mg by mouth daily.       [name] zolpidem (AMBIEN) 10 MG tablet Take 10 mg by mouth at bedtime.        No current facility-administered medications for this visit.   Facility-Administered Medications Ordered in Other Visits  Medication Dose Route Frequency Provider Last Rate Last Dose  . CISplatin (PLATINOL) 100 mg in sodium chloride 0.9 % 500 mL chemo infusion  100 mg Intravenous Once Exie Parody, MD   100 mg at 11/01/11 1352  . dexamethasone (DECADRON) injection 20 mg  20 mg Intravenous Once Exie Parody, MD   20 mg at 11/01/11 1308  . dextrose 5 % and 0.45% NaCl 1,000 mL with potassium chloride 20 mEq, magnesium sulfate 12 mEq infusion   Intravenous Once Exie Parody, MD      . ondansetron (ZOFRAN) IVPB 16 mg  16 mg Intravenous Once Exie Parody, MD   16 mg at 11/01/11 1308  . DISCONTD: 0.9 %  sodium chloride infusion   Intravenous Once Exie Parody, MD      . DISCONTD: alteplase (CATHFLO ACTIVASE) injection 2 mg  2 mg Intracatheter Once PRN Exie Parody, MD      . DISCONTD: heparin lock flush 100 unit/mL  500 Units Intracatheter Once PRN Exie Parody, MD      . DISCONTD: heparin lock flush 100 unit/mL  250 Units Intracatheter Once PRN Exie Parody, MD      . DISCONTD: sodium chloride 0.9 % injection 10 mL  10 mL Intracatheter PRN Exie Parody, MD      . DISCONTD: sodium chloride 0.9 % injection 3 mL  3 mL Intravenous PRN Exie Parody, MD        ALLERGIES:  is allergic to cyclobenzaprine hcl.  REVIEW OF SYSTEMS:  The rest of the 14-point review of system was negative.   There were no vitals filed for this visit. Wt Readings from Last 3 Encounters:  11/01/11 249 lb 3.2 oz (113.036 kg)  11/01/11 249  lb (112.946 kg)  10/25/11 255 lb 6.4 oz (115.849 kg)   ECOG Performance status: 0  PHYSICAL EXAMINATION:   General: well-nourished man in no acute distress. Eyes: no scleral icterus. ENT: There were no oropharyngeal lesions. Neck was without thyromegaly. Lymphatics: Left cervical nodes are less distinct compared to prior to  chemoradiation.  Negative for supraclavicular or axillary adenopathy. Respiratory: lungs were clear bilaterally without wheezing or crackles. Cardiovascular: Regular rate and rhythm, S1/S2, without murmur, rub or gallop. There was no pedal edema. GI: abdomen was soft, flat, nontender, nondistended, without organomegaly. RUQ removed PEG tube site has healed nicely with granulation tissue without erythema, purulent discharge, or pain on palpation. He has a surgical G-tube. Vertical abdominal wound has healed completely as well. Muscoloskeletal: no spinal tenderness of palpation of vertebral spine. Skin exam was without echymosis, petichae. Neuro exam was nonfocal.  Patient was alerted and oriented. Attention was good. Language was appropriate. Mood was normal without depression. Speech was not pressured. Thought content was not tangential.         LABORATORY/RADIOLOGY DATA:  Lab Results  Component Value Date   WBC 6.1 10/29/2011   HGB 13.5 10/29/2011   HCT 40.1 10/29/2011   PLT 170 10/29/2011   GLUCOSE 95 10/29/2011   ALKPHOS 61 10/20/2011   ALT 19 10/20/2011   AST 20 10/20/2011   NA 137 10/29/2011   K 4.3 10/29/2011   CL 101 10/29/2011   CREATININE 0.82 10/29/2011   BUN 16 10/29/2011   CO2 26 10/29/2011   INR 0.91 09/13/2011     ASSESSMENT AND PLAN:   1. History of smoking: he quit in college.   2. History of drinking a few glasses of wine a day: He also quit this 3 years ago.   3. Obstructive sleep apnea: He is on C-pap.   4. BPH: Symptoms well controlled with Flomax.   5. Newly diagnosed cT1 N2b M0 SCC; HPV equivocal.  - He is on week #4 of radiation and week #2 of  chemotherapy. He has grade 2-3 mucositis.  However, he does not have dose limiting toxicity to warrant dose reduction of chemotherapy yet.  I recommended him to proceed with the 2nd week of cisplatin today.  Depending on how he tolerates chemo at the end of radiation, we may prolong chemotherapy past radiation due to delayed start of chemo.   6. Mucositis: I advised him to proceed with taking Lortab elixir in order to allow him to continue taking food/fluids by mouth in addition to swallowing exercise as to decrease the risk of esophageal stricture.  In the future, it is expected that he will have worsened mucositis; and we may need to add on Fentanyl patch.  I advised him to continue with magic mouth wash, salt/baking soda, and diluted H2O2 mouth rinses.  I advised him to take stool softeners when he takes Lortab on a more routine basis.    7. Moderate calorie/protein malnutrition: he sees [employer] from Nutrition.  He was advised to increase his boost up to 8 cans daily from just 6 and space out his feeding to minimize GERD.  8. Nausea/vomiting prophy: He has Compazine/Zofran/Ativan prn.   9.  Follow up:  In one week.  The length of time of the face-to-face encounter was . More than 50% of time was spent counseling and coordination of care.     The length of time of the face-to-face encounter was 15 minutes. More than 50% of time was spent counseling and coordination of care.

## 2011-11-01 NOTE — Progress Notes (Signed)
Weekly Management Note:  Site:Mucosal axis/neck Current Dose:  4200  cGy Projected Dose: 7000  cGy  Narrative: The patient is seen today for routine under treatment assessment. CBCT/MVCT images/port films were reviewed. The chart was reviewed.   He does report mouth/throat discomfort along with constipation. He is not taking any narcotics although he does have liquid hydrocodone to take when necessary. She used Dulcolax for constipation. He takes in 6 cans of boost plus a day. His weight is down 6 pounds over the past week. He is receiving his weekly chemotherapy today. CBC from May 17 is within normal limits.  Physical Examination:  Filed Vitals:   11/01/11 1130  BP: 123/73  Pulse: 74  Temp: 97 F (36.1 C)  .  Weight: 249 lb 3.2 oz (113.036 kg). There has been excellent regression of his left neck adenopathy but there is still some fullness present. On inspection oral cavity there is a confluent mucositis along his oropharynx as expected. No evidence of candidiasis. Labs:  Lab Results  Component Value Date   WBC 6.1 10/29/2011   HGB 13.5 10/29/2011   HCT 40.1 10/29/2011   MCV 87.0 10/29/2011   PLT 170 10/29/2011   Impression: Tolerating radiation therapy well. I told him that he could take his liquid hydrocodone and when necessary throat discomfort. He may also start MiraLax and try to avoid using Dulcolax.  Plan: Continue radiation therapy as planned.

## 2011-11-01 NOTE — Progress Notes (Signed)
Steven Robinson and his wife present to nutrition followup.  His weight has decreased to 249 pounds.  He is not tolerating 8 Boost Plus via feeding tube daily which is his goal rate but most days he does tolerate 6 Boost Plus.  He is flushing with 30 mL of free water before and 180 mL free water after. He complains of some indigestion as well as nausea but no vomiting.  He does report constipation for which he has been taking medication.  He is not eating food by mouth.  NUTRITION DIAGNOSIS:  Inadequate oral intake and food and nutrition-related knowledge deficit both continue.  INTERVENTION:  The patient was encouraged to plan his feedings a little bit more carefully and try to get his feedings in earlier in the day so that he is not faced with an intolerance issue late at night preventing him from getting in his last feedings.  He was encouraged to add protein powder as well as trying some prune juice daily to help with his constipation.  Boost Plus is providing 3 gm of fiber per can which should help with his constipation.  MONITORING, EVALUATION, AND GOALS:  The patient will tolerate bolus tube feedings at goal rate with free water to minimize weight loss throughout treatment.  NEXT VISIT:  Tuesday, May 20.    ______________________________ Zenovia Jarred, RD, LDN Clinical Nutrition Specialist BN/MEDQ  D:  11/01/2011  T:  11/01/2011  Job:  [id number]

## 2011-11-01 NOTE — Progress Notes (Signed)
21/35 treatments to neck.  Diffuse erythema nec,  Lower face and back of neck and upper back.  Skin intact.  Using Biafine.  Note rash-like appearance right upper chest region.    Mouth sore and states expectoration of small clots of blood each am when gargling.  Has Sore/crusting left nare and c/o discomfort in this area but no bleednig noted.Denies any fatigue.  To receive chemotherapy today.  Zofran BID.  Denies nausea.  Peg tube site clear.  Enteral feeds  With ensure.  6 per day but dietitian wants him to increase to 8 cans daily.

## 2011-11-02 ENCOUNTER — Other Ambulatory Visit: Payer: Self-pay | Admitting: Certified Registered Nurse Anesthetist

## 2011-11-02 ENCOUNTER — Ambulatory Visit
Admission: RE | Admit: 2011-11-02 | Discharge: 2011-11-02 | Disposition: A | Discharge status: Home / Self Care | Payer: Managed Care, Other (non HMO) | Source: Ambulatory Visit | Attending: Radiation Oncology | Admitting: Radiation Oncology

## 2011-11-03 ENCOUNTER — Other Ambulatory Visit: Payer: Self-pay | Admitting: Oncology

## 2011-11-03 ENCOUNTER — Ambulatory Visit
Admission: RE | Admit: 2011-11-03 | Discharge: 2011-11-03 | Disposition: A | Discharge status: Home / Self Care | Payer: Managed Care, Other (non HMO) | Source: Ambulatory Visit | Attending: Radiation Oncology | Admitting: Radiation Oncology

## 2011-11-04 ENCOUNTER — Ambulatory Visit
Admission: RE | Admit: 2011-11-04 | Discharge: 2011-11-04 | Disposition: A | Discharge status: Home / Self Care | Payer: Managed Care, Other (non HMO) | Source: Ambulatory Visit | Attending: Radiation Oncology | Admitting: Radiation Oncology

## 2011-11-05 ENCOUNTER — Other Ambulatory Visit (HOSPITAL_BASED_OUTPATIENT_CLINIC_OR_DEPARTMENT_OTHER): Payer: Managed Care, Other (non HMO) | Admitting: Lab

## 2011-11-05 ENCOUNTER — Ambulatory Visit
Admission: RE | Admit: 2011-11-05 | Discharge: 2011-11-05 | Disposition: A | Discharge status: Home / Self Care | Payer: Managed Care, Other (non HMO) | Source: Ambulatory Visit | Attending: Radiation Oncology | Admitting: Radiation Oncology

## 2011-11-05 DIAGNOSIS — C760 Malignant neoplasm of head, face and neck: Secondary | ICD-10-CM

## 2011-11-05 LAB — BASIC METABOLIC PANEL
BUN: 18 mg/dL (ref 6–23)
Chloride: 102 mEq/L (ref 96–112)
Potassium: 4.4 mEq/L (ref 3.5–5.3)

## 2011-11-05 LAB — CBC WITH DIFFERENTIAL/PLATELET
Basophils Absolute: 0 10*3/uL (ref 0.0–0.1)
Eosinophils Absolute: 0.1 10*3/uL (ref 0.0–0.5)
LYMPH%: 3.1 % — ABNORMAL LOW (ref 14.0–49.0)
MCV: 87.1 fL (ref 79.3–98.0)
MONO%: 10.5 % (ref 0.0–14.0)
NEUT#: 5.9 10*3/uL (ref 1.5–6.5)
NEUT%: 85.1 % — ABNORMAL HIGH (ref 39.0–75.0)
Platelets: 190 10*3/uL (ref 140–400)
RBC: 4.61 10*6/uL (ref 4.20–5.82)

## 2011-11-09 ENCOUNTER — Ambulatory Visit (HOSPITAL_BASED_OUTPATIENT_CLINIC_OR_DEPARTMENT_OTHER): Payer: Managed Care, Other (non HMO)

## 2011-11-09 ENCOUNTER — Ambulatory Visit: Payer: Managed Care, Other (non HMO) | Admitting: Oncology

## 2011-11-09 ENCOUNTER — Ambulatory Visit: Payer: Managed Care, Other (non HMO) | Admitting: Nutrition

## 2011-11-09 ENCOUNTER — Ambulatory Visit
Admission: RE | Admit: 2011-11-09 | Discharge: 2011-11-09 | Disposition: A | Discharge status: Home / Self Care | Payer: Managed Care, Other (non HMO) | Source: Ambulatory Visit | Attending: Radiation Oncology | Admitting: Radiation Oncology

## 2011-11-09 VITALS — BP 120/76 | HR 86 | Temp 97.0°F | Ht 73.0 in | Wt 247.0 lb

## 2011-11-09 DIAGNOSIS — C760 Malignant neoplasm of head, face and neck: Secondary | ICD-10-CM

## 2011-11-09 DIAGNOSIS — C770 Secondary and unspecified malignant neoplasm of lymph nodes of head, face and neck: Secondary | ICD-10-CM

## 2011-11-09 DIAGNOSIS — Z5111 Encounter for antineoplastic chemotherapy: Secondary | ICD-10-CM

## 2011-11-09 MED ORDER — DEXAMETHASONE SODIUM PHOSPHATE 4 MG/ML IJ SOLN
20.0000 mg | Freq: Once | INTRAMUSCULAR | Status: AC
  Administered 2011-11-09: 20 mg via INTRAVENOUS

## 2011-11-09 MED ORDER — ONDANSETRON 16 MG/50ML IVPB (CHCC)
16.0000 mg | Freq: Once | INTRAVENOUS | Status: AC
  Administered 2011-11-09: 16 mg via INTRAVENOUS

## 2011-11-09 MED ORDER — FENTANYL 25 MCG/HR TD PT72: 1.0000 | MEDICATED_PATCH | TRANSDERMAL | Status: AC

## 2011-11-09 MED ORDER — SODIUM CHLORIDE 0.9 % IV SOLN
100.0000 mg | Freq: Once | INTRAVENOUS | Status: AC
  Administered 2011-11-09: 100 mg via INTRAVENOUS
  Filled 2011-11-09: qty 100

## 2011-11-09 MED ORDER — POTASSIUM CHLORIDE 2 MEQ/ML IV SOLN
Freq: Once | INTRAVENOUS | Status: AC
  Administered 2011-11-09: 10:00:00 via INTRAVENOUS
  Filled 2011-11-09: qty 10

## 2011-11-09 MED ORDER — SODIUM CHLORIDE 0.9 % IV SOLN
Freq: Once | INTRAVENOUS | Status: AC
  Administered 2011-11-09: 12:00:00 via INTRAVENOUS

## 2011-11-09 NOTE — Patient Instructions (Signed)
[facility] Discharge Instructions for Patients Receiving Chemotherapy  Today you received the following chemotherapy agent Cisplatin.  To help prevent nausea and vomiting after your treatment, we encourage you to take your nausea medication. Begin taking it as often as prescribed for by your physician.    If you develop nausea and vomiting that is not controlled by your nausea medication, call the clinic. If it is after clinic hours your family physician or the after hours number for the clinic or go to the Emergency Department.   BELOW ARE SYMPTOMS THAT SHOULD BE REPORTED IMMEDIATELY:  *FEVER GREATER THAN 100.5 F  *CHILLS WITH OR WITHOUT FEVER  NAUSEA AND VOMITING THAT IS NOT CONTROLLED WITH YOUR NAUSEA MEDICATION  *UNUSUAL SHORTNESS OF BREATH  *UNUSUAL BRUISING OR BLEEDING  TENDERNESS IN MOUTH AND THROAT WITH OR WITHOUT PRESENCE OF ULCERS  *URINARY PROBLEMS  *BOWEL PROBLEMS  UNUSUAL RASH Items with * indicate a potential emergency and should be followed up as soon as possible.  One of the nurses will contact you 24 hours after your treatment. Please let the nurse know about any problems that you may have experienced. Feel free to call the clinic you have any questions or concerns. The clinic phone number is [phone].   I have been informed and understand all the instructions given to me. I know to contact the clinic, my physician, or go to the Emergency Department if any problems should occur. I do not have any questions at this time, but understand that I may call the clinic during office hours   should I have any questions or need assistance in obtaining follow up care.    __________________________________________  _____________  __________ Signature of Patient or Authorized Representative            Date                   Time    __________________________________________ Nurse's Signature    

## 2011-11-09 NOTE — Progress Notes (Signed)
1158 Prehydrated with 450cc of normal saline and 740cc oral intake. Urine output of 280 ccs. 1605 Post-hydration complete. Urine output 1600 cc with a total of 1880 for today's treatment. Patient tolerated treatment well with no complaints.

## 2011-11-09 NOTE — Progress Notes (Signed)
[name] reports he is doing pretty well.  He has mucositis and some very painful swallowing and has been prescribed some additional pain medication.  His weight is down 2 pounds to 247 pounds today from 249 pounds last week.  He is tolerating 8 cans of Boost Plus daily on most days.  He is drinking water as well as flushing his feeding tube with water and he states that he gets about 100 ounces of water daily.  He has not tried to eat much food by mouth but he does continue to practice his swallow exercises and is rinsing his mouth as well as drinking water.  NUTRITION DIAGNOSIS:  Inadequate oral intake and food and nutrition related knowledge deficit continue but have improved.  INTERVENTION:  [name] was encouraged to continue Boost Plus via feeding tube, 8 cans daily, to provide 2880 calories, 136 g of protein which should meet 100% of his estimated nutrition needs.  He is encouraged to continue to try to eat small amounts by mouth as tolerated.  MONITORING/EVALUATION/GOALS:  The patient will continue to tolerate bolus tube feeding at goal rate with free water to minimize weight loss throughout treatment.  NEXT VISIT:  Monday, June 3rd.    ______________________________ Zenovia Jarred, RD, LDN Clinical Nutrition Specialist BN/MEDQ  D:  11/09/2011  T:  11/09/2011  Job:  [id number]

## 2011-11-09 NOTE — Progress Notes (Signed)
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 He had positive PET scan in the left base of the tongue but direct laryngoscopy and biopsy was negative.   PAST THERAPY: FNA only   CURRENT THERAPY: Started daily radiation on 10/12/2011. Chemotherapy weekly cisplatin was delayed until 10/25/10 due to PEG tube infection.  INTERVAL HISTORY: Steven Robinson 61 y.o. male returns for regular follow up with his wife.  He reported more mucositis pain despite pain meds.  He has been taking Lortab elixir about twice daily without relief.  Pain is moderate to severe.  He has been able to do swallowing exercise and drink water.  However, he has not been able to eat by mouth. He still has xerostomia and thick phlegm.  He has been adherent to mouth rinses with salt/baking soda and diluted hydrogen peroxide.  He has been taking in about 8 cans of boost per day via PEG tube by gravity.  He has pain in the skin of his neck from radiation which he uses Biafine and Hydrocortisone cream for.   He denied nausea/vomiting, fever, PEG tube pain, purulent discharge, cough, diarrhea, constipation, bleeding symptoms, depression.  He has mild fatigue; however, he is still independent of activities of daily living.   Patient denies headache, visual changes, tinnitus, hearing loss, confusion, drenching night sweats, palpable lymph node swelling, jaundice, chest pain, palpitation, shortness of breath, dyspnea on exertion, productive cough, gum bleeding, epistaxis, hematemesis, hemoptysis, abdominal pain, abdominal swelling, early satiety, melena, hematochezia, hematuria,  spontaneous bleeding, joint swelling, joint pain, heat or cold intolerance, bowel bladder incontinence, back pain, focal motor weakness, paresthesia, depression.    Past Medical History  Diagnosis Date  .  Hyperlipoproteinemia   . Obstructive sleep apnea     on C-pap.   [name] GERD (gastroesophageal reflux disease)     chronic  . Hx of colonic polyps   . 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 Vertigo     2/2 intersitial neuritis.   [name] Hearing loss of left ear   . BPH (benign prostatic hyperplasia)   . 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 Hyperlipidemia   . Mucositis 11/01/2011    Past Surgical History  Procedure Date  . Hernia repair 1990    Left  . Fine needle aspiration 08/26/11     LEFT NECK FNA, LYMPH NODE, METASTATIC SQUAMOUS CELL CARCINOMA  . Gastrostomy 09/24/2011    Procedure: GASTROSTOMY;  Surgeon: Liz Malady, MD;  Location: [facility] OR;  Service: General;  Laterality: N/A;  open G-tube placement procedure start @1219   . Rigid esophagoscopy 09/24/2011    Procedure: RIGID ESOPHAGOSCOPY;  Surgeon: Suzanna Obey, MD;  Location: [facility] OR;  Service: ENT;  Laterality: N/A;  Esophagoscopy procedure end @1210     Current Outpatient Prescriptions  Medication Sig Dispense Refill  . ALPRAZolam (XANAX) 0.5 MG tablet prn      . Alum & Mag Hydroxide-Simeth (MAGIC MOUTHWASH W/LIDOCAINE) SOLN Take 10 mLs by mouth 4 (four) times daily as needed (alternate with carafate, take about 30 min before meals and bedtime).  480 mL  5  . aspirin EC 81 MG tablet Take 81 mg by mouth daily.      [name] dexlansoprazole (DEXILANT) 60 MG capsule Take 60 mg by mouth daily.      . diphenhydrAMINE (BENADRYL) 25 MG tablet  Take 25 mg by mouth at bedtime.       . Ensure Plus (ENSURE PLUS) LIQD Begin one can of Ensure Plus QID with 60 ml free water before and after each bolus feeding.  In addition, either drink or flush tube with 300 ml free water TID.  If tolerated, increase bolus feedings to 1.5 cans QID on Monday, May 13th. If tolerated, increase to goal of 2 cans Ensure Plus QID.  8 Can  0  . HYDROcodone-acetaminophen (LORTAB) 7.5-500 MG/15ML solution Take 15 mLs by mouth every 6 (six) hours as needed for pain.  480 mL  0  .  LORazepam (ATIVAN) 0.5 MG tablet Take 0.5 mg by mouth every 6 (six) hours as needed.      . ondansetron (ZOFRAN) 8 MG tablet Take by mouth every 12 (twelve) hours as needed. For N/V after 3rd day after chemotherapy      . prochlorperazine (COMPAZINE) 10 MG tablet Take 10 mg by mouth every 6 (six) hours as needed.      . rosuvastatin (CRESTOR) 40 MG tablet Take 40 mg by mouth daily.      . sucralfate (CARAFATE) 1 GM/10ML suspension Take 10 mLs (1 g total) by mouth 4 (four) times daily.  420 mL  5  . Tamsulosin HCl (FLOMAX) 0.4 MG CAPS Take 0.4 mg by mouth daily.       [name] zolpidem (AMBIEN) 10 MG tablet Take 10 mg by mouth at bedtime.       . fentaNYL (DURAGESIC - DOSED MCG/HR) 25 MCG/HR Place 1 patch (25 mcg total) onto the skin every 3 (three) days.  10 patch  0  . LOVAZA 1 G capsule        No current facility-administered medications for this visit.   Facility-Administered Medications Ordered in Other Visits  Medication Dose Route Frequency Provider Last Rate Last Dose  . 0.9 %  sodium chloride infusion   Intravenous Once Exie Parody, MD      . dextrose 5 % and 0.45% NaCl 1,000 mL with potassium chloride 20 mEq, magnesium sulfate 12 mEq infusion   Intravenous Once Exie Parody, MD 250 mL/hr at 11/09/11 0951      ALLERGIES:  is allergic to cyclobenzaprine hcl.  REVIEW OF SYSTEMS:  The rest of the 14-point review of system was negative.   Filed Vitals:   11/09/11 0854  BP: 120/76  Pulse: 86  Temp: 97 F (36.1 C)   Wt Readings from Last 3 Encounters:  11/09/11 247 lb (112.038 kg)  11/01/11 249 lb 3.2 oz (113.036 kg)  11/01/11 249 lb (112.946 kg)   ECOG Performance status: 0-1.  PHYSICAL EXAMINATION:   General: well-nourished man in no acute distress. Eyes: no scleral icterus. ENT: There were no oropharyngeal lesions. Neck was without thyromegaly. Lymphatics: Left cervical nodes are less distinct compared to prior to chemoradiation.  Negative for supraclavicular or axillary adenopathy.  Respiratory: lungs were clear bilaterally without wheezing or crackles. Cardiovascular: Regular rate and rhythm, S1/S2, without murmur, rub or gallop. There was no pedal edema. GI: abdomen was soft, flat, nontender, nondistended, without organomegaly. RUQ removed PEG tube site has healed nicely with granulation tissue without erythema, purulent discharge, or pain on palpation. He has a surgical G-tube. Vertical abdominal wound has healed completely as well. Muscoloskeletal: no spinal tenderness of palpation of vertebral spine. Skin exam was without echymosis, petichae. Neuro exam was nonfocal.  Patient was alerted and oriented. Attention was good. Language was appropriate. Mood  was normal without depression. Speech was not pressured. Thought content was not tangential.    LABORATORY/RADIOLOGY DATA:  Lab Results  Component Value Date   WBC 6.9 11/05/2011   HGB 13.8 11/05/2011   HCT 40.2 11/05/2011   PLT 190 11/05/2011   GLUCOSE 96 11/05/2011   ALKPHOS 61 10/20/2011   ALT 19 10/20/2011   AST 20 10/20/2011   NA 139 11/05/2011   K 4.4 11/05/2011   CL 102 11/05/2011   CREATININE 0.87 11/05/2011   BUN 18 11/05/2011   CO2 28 11/05/2011   INR 0.91 09/13/2011     ASSESSMENT AND PLAN:   1. History of smoking: he quit in college.   2. History of drinking a few glasses of wine a day: He also quit this 3 years ago.   3. Obstructive sleep apnea: He is on C-pap.   4. BPH: Symptoms well controlled with Flomax.   5. Newly diagnosed cT1 N2b M0 SCC; HPV equivocal.  - He is on week #5 of radiation and week #3 of chemotherapy. He has grade 3 mucositis.  His current level of mucositis is expected for this type of cancer and where he is with the treatment. I do not recommend cisplatin dose reduction just yet since he started chemo after starting radiation due to PEG tube infection.  He is only on cisplatin 40mg  IV qweek.  I am concerned that dose reduction at this time may compromise the outcome.  Of course, with further  treatment, if his mucositis significantly worsens or he develops cytopenia, or worsened skin burn, then cisplatin dose reduction should be considered.  His radiation is due to finish on 11/22/2011.  Depending on his toleration, performance status, and his blood count, his chemo therapy may be extended further past finish of radiation to make up for the initial delay.  His restaging PET scan should be scheduled about 3-4 months from finish of chemoradiation to assess response to therapy.   6. Mucositis: due to his current grade 3 mucositis, I recommended adding a long acting pain med in the form of fentanyl patch at 62mcg/hour change every 3 days.  In the future, the dosing of fentanyl will need to be increased if mucositis pain is still not under controlled.  I recommended him to continue with Lortab elixir for breakthrough pain despite fentanyl patch as fentanyl patch may not be enough.  In the subsequent weeks, if his break through pain is still not under control, we may consider switching from Lortab to Dilaudid for break through.  I do not endorse starting a new long acting pain med and a new break through pain med to avoid over sedation.  I advised him to continue Speech pathology exercise to decrease the risk of esophageal stricture.  I advised him to continue taking fluid by mouth as long as he can to decrease the risk of permanent PEG tube dependency.  I advised him to continue with salt/baking soda alternating with diluted H2O2 to thin out the thick phelgm from xerostomia.   7. Moderate calorie/protein malnutrition: he sees Vernell Leep from Nutrition today.  He has been taking Ensure plus 8 cans daily.  He has only lost about 2 lbs since last week.   8. Nausea/vomiting prophy: He has Compazine/Zofran/Ativan prn.   9.  Hyperlipidemia:  He is on Crestor per PCP.   10.  History of PEG tube infection:  The site of removed PEG tube still appears well healed.  The G-tube that was placed  by General Surgery  appears clean and intact.  In the future, when the G-tube is ready to come out, he needs to be referred back to General Surgery for removal and not IR.    11.  Disposition:  Due to differences in style of communication, patient and his wife requested switching to a different medical oncologist.  I will work with administration to find him a replacement.  In the mean time, I appreciate Dr. Colletta Maryland management of patient's active issues.

## 2011-11-10 ENCOUNTER — Ambulatory Visit
Admission: RE | Admit: 2011-11-10 | Discharge: 2011-11-10 | Disposition: A | Discharge status: Home / Self Care | Payer: Managed Care, Other (non HMO) | Source: Ambulatory Visit | Attending: Radiation Oncology | Admitting: Radiation Oncology

## 2011-11-10 ENCOUNTER — Encounter: Payer: Self-pay | Admitting: Radiation Oncology

## 2011-11-10 VITALS — BP 118/77 | HR 71 | Temp 97.0°F | Resp 20 | Wt 250.0 lb

## 2011-11-10 DIAGNOSIS — C770 Secondary and unspecified malignant neoplasm of lymph nodes of head, face and neck: Secondary | ICD-10-CM

## 2011-11-10 NOTE — Progress Notes (Signed)
Patient alert,oriented x3, bright erythema and start of skin peeling on neck,using biafine cream 4xday, and prn cortisone cream, pain an "8" in back of throat, tongue is pink and furrowed , drinks only water, uses peg tube for feedings now, started duragesic patch on right forearm yesterday, and is taking lortab elixir tid now, so far not much p[ain relief,sttes"feels like a broken coke bottle swallowing when first gets  up in the mornings, ,cisplatin weekly 11:49 AM

## 2011-11-10 NOTE — Progress Notes (Signed)
   Weekly Management Note: Head and Neck Cancer, unknown primary Current Dose:  5400 cGy  Projected Dose: 7000 cGy   Narrative:  The patient presents for routine under treatment assessment.  CBCT/MVCT images/Port film x-rays were reviewed.  The chart was checked. He started 25 mcg Duragesic patch yesterday for her severe pain from mucositis. He is nevertheless in good spirits. He does have some appetite. He is using his PEG tube.  He is also using Biafine for desquamation over his neck. He continues cis-platinum weekly. He is going to be changing medical oncologists.  Physical Findings: Weight: 250 lb (113.399 kg). Blood pressure 118/77, pulse 71, temperature 97 degrees, respiratory rate 20. His weight is stable compared to last week. He has lost about 19 pounds since April 15. His oropharynx is notable for confluent mucositis. No thrush. His neck is erythematous with dry desquamation. The lymph node mass in the left upper neck has virtually disappeared, there is a little bit of firmness there that may be scar tissue.  CMP     Component Value Date/Time   NA 139 11/05/2011 1115   K 4.4 11/05/2011 1115   CL 102 11/05/2011 1115   CO2 28 11/05/2011 1115   GLUCOSE 96 11/05/2011 1115   BUN 18 11/05/2011 1115   CREATININE 0.87 11/05/2011 1115   CALCIUM 9.1 11/05/2011 1115   PROT 6.6 10/20/2011 1215   ALBUMIN 4.2 10/20/2011 1215   AST 20 10/20/2011 1215   ALT 19 10/20/2011 1215   ALKPHOS 61 10/20/2011 1215   BILITOT 0.9 10/20/2011 1215   GFRNONAA >90 09/24/2011 0450   GFRAA >90 09/24/2011 0450    CBC    Component Value Date/Time   WBC 6.9 11/05/2011 1115   WBC 8.0 09/24/2011 0450   RBC 4.61 11/05/2011 1115   RBC 4.30 09/24/2011 0450   HGB 13.8 11/05/2011 1115   HGB 12.8* 09/24/2011 0450   HCT 40.2 11/05/2011 1115   HCT 37.5* 09/24/2011 0450   PLT 190 11/05/2011 1115   PLT 274 09/24/2011 0450   MCV 87.1 11/05/2011 1115   MCV 87.2 09/24/2011 0450   MCH 29.9 11/05/2011 1115   MCH 29.8 09/24/2011 0450   MCHC 34.3  11/05/2011 1115   MCHC 34.1 09/24/2011 0450   RDW 13.5 11/05/2011 1115   RDW 12.4 09/24/2011 0450   LYMPHSABS 0.2* 11/05/2011 1115   MONOABS 0.7 11/05/2011 1115   EOSABS 0.1 11/05/2011 1115   BASOSABS 0.0 11/05/2011 1115    Impression:  The patient is tolerating radiotherapy with expected acute effects.  Plan:  Continue radiotherapy as planned. I did give him a prescription for 12 mcg Duragesic patches in case his pain control is not adequate over the weekend, I told the patient that he can use 37.5 mcg at a time so essentially double up the patches every 72 hours. I will see him again in 5 days.

## 2011-11-11 ENCOUNTER — Ambulatory Visit
Admission: RE | Admit: 2011-11-11 | Discharge: 2011-11-11 | Disposition: A | Discharge status: Home / Self Care | Payer: Managed Care, Other (non HMO) | Source: Ambulatory Visit | Attending: Radiation Oncology | Admitting: Radiation Oncology

## 2011-11-12 ENCOUNTER — Other Ambulatory Visit (HOSPITAL_BASED_OUTPATIENT_CLINIC_OR_DEPARTMENT_OTHER): Payer: Managed Care, Other (non HMO) | Admitting: Lab

## 2011-11-12 ENCOUNTER — Other Ambulatory Visit: Payer: Self-pay | Admitting: Oncology

## 2011-11-12 ENCOUNTER — Ambulatory Visit
Admission: RE | Admit: 2011-11-12 | Discharge: 2011-11-12 | Disposition: A | Discharge status: Home / Self Care | Payer: Managed Care, Other (non HMO) | Source: Ambulatory Visit | Attending: Radiation Oncology | Admitting: Radiation Oncology

## 2011-11-12 DIAGNOSIS — C760 Malignant neoplasm of head, face and neck: Secondary | ICD-10-CM

## 2011-11-12 LAB — CBC WITH DIFFERENTIAL/PLATELET
BASO%: 0.4 % (ref 0.0–2.0)
Basophils Absolute: 0 10*3/uL (ref 0.0–0.1)
EOS%: 0.9 % (ref 0.0–7.0)
HGB: 12.7 g/dL — ABNORMAL LOW (ref 13.0–17.1)
MCH: 29.7 pg (ref 27.2–33.4)
RDW: 14 % (ref 11.0–14.6)
lymph#: 0.1 10*3/uL — ABNORMAL LOW (ref 0.9–3.3)

## 2011-11-12 NOTE — Progress Notes (Signed)
HPI: The patient noted an enlarging left neck mass early March 2013, and brought this to the attention of his PCP, Dr. Dewain Penning. She obtained a neck CT at [facility] 08/20/2011, which showed multiple enlarged nodes in the left mid-neck along the jugular v., the largest 2.0 cm. She referred the patient to Suzanna Obey in ENT and FNA of one of the enlarged nodes was positive for squamous cell carcinoma ([id number]). A p16 stain was read as equivocal. PET scan 09/03/2011 showed SUVs >6 in the left oropharynx and left jugular chain nodes; SUV of 5.49 was noted in the larynx. There was no evidence of contralateral or disseminated disease.  This was consistent with a clinical T1 N2,stage IVA left oropharyngeal SCCA. However multiple biopsies under Dr. Jearld Fenton 09/09/2011 [[id number]] were all negative. The pathologic staging therefore is TX N2, which impacts radiation planning.   The patient met with Drs. Cruzita Lederer, and IMRT with concurrent chemosensitization was planned. The patient had dental evaluation under Dr. Kristin Bruins, several extractions under Dr. Chales Salmon, had audiometry (showing mild high-frequency hearing loss) and met with nutrition. A PEG tube placed by IR unfortunately became dislodged and infected, and had to be replaced through an open procedure under Violeta Gelinas. This delayed the start of chemotherapy and caused a change in plan from the original CDDP at 100 mg/m2 Q3w to 40 mg/M2 weekly. Radiation was started 10/04/2011 and chemotherapy 10/25/2011. The patient's subsequent history is as detailed below.

## 2011-11-13 LAB — BASIC METABOLIC PANEL
CO2: 28 mEq/L (ref 19–32)
Calcium: 8.9 mg/dL (ref 8.4–10.5)
Creatinine, Ser: 0.84 mg/dL (ref 0.50–1.35)

## 2011-11-15 ENCOUNTER — Ambulatory Visit: Payer: Self-pay

## 2011-11-15 ENCOUNTER — Encounter: Payer: Self-pay | Admitting: Radiation Oncology

## 2011-11-15 ENCOUNTER — Ambulatory Visit: Payer: Managed Care, Other (non HMO)

## 2011-11-15 ENCOUNTER — Ambulatory Visit: Payer: Managed Care, Other (non HMO) | Admitting: Nutrition

## 2011-11-15 ENCOUNTER — Ambulatory Visit (HOSPITAL_BASED_OUTPATIENT_CLINIC_OR_DEPARTMENT_OTHER): Payer: Managed Care, Other (non HMO) | Admitting: Oncology

## 2011-11-15 ENCOUNTER — Ambulatory Visit: Payer: Self-pay | Admitting: Oncology

## 2011-11-15 ENCOUNTER — Encounter: Payer: Self-pay | Admitting: Nutrition

## 2011-11-15 ENCOUNTER — Ambulatory Visit
Admission: RE | Admit: 2011-11-15 | Discharge: 2011-11-15 | Disposition: A | Discharge status: Home / Self Care | Payer: Managed Care, Other (non HMO) | Source: Ambulatory Visit | Attending: Radiation Oncology | Admitting: Radiation Oncology

## 2011-11-15 ENCOUNTER — Ambulatory Visit: Payer: Managed Care, Other (non HMO) | Attending: Radiation Oncology

## 2011-11-15 VITALS — BP 128/77 | HR 100 | Temp 98.8°F | Ht 73.0 in | Wt 248.9 lb

## 2011-11-15 VITALS — BP 116/82 | HR 82 | Resp 20 | Wt 247.9 lb

## 2011-11-15 DIAGNOSIS — R1313 Dysphagia, pharyngeal phase: Secondary | ICD-10-CM | POA: Insufficient documentation

## 2011-11-15 DIAGNOSIS — C801 Malignant (primary) neoplasm, unspecified: Secondary | ICD-10-CM

## 2011-11-15 DIAGNOSIS — R5383 Other fatigue: Secondary | ICD-10-CM

## 2011-11-15 DIAGNOSIS — C770 Secondary and unspecified malignant neoplasm of lymph nodes of head, face and neck: Secondary | ICD-10-CM

## 2011-11-15 DIAGNOSIS — IMO0001 Reserved for inherently not codable concepts without codable children: Secondary | ICD-10-CM | POA: Insufficient documentation

## 2011-11-15 DIAGNOSIS — K121 Other forms of stomatitis: Secondary | ICD-10-CM

## 2011-11-15 DIAGNOSIS — R5381 Other malaise: Secondary | ICD-10-CM

## 2011-11-15 DIAGNOSIS — K1230 Oral mucositis (ulcerative), unspecified: Secondary | ICD-10-CM

## 2011-11-15 DIAGNOSIS — B370 Candidal stomatitis: Secondary | ICD-10-CM

## 2011-11-15 DIAGNOSIS — C109 Malignant neoplasm of oropharynx, unspecified: Secondary | ICD-10-CM

## 2011-11-15 MED ORDER — FLUCONAZOLE 100 MG PO TABS
100.0000 mg | ORAL_TABLET | Freq: Every day | ORAL | Status: AC
Start: 2011-11-15 — End: 2011-11-22

## 2011-11-15 NOTE — Progress Notes (Signed)
Mr. Ionescu continues to do well.  His weight is stable at 247.9 pounds. He continues to tolerate 8 Boost Plus via his feeding tube daily, usually 2 cans q.i.d.  He is flushing his feeding tube with water and consumes greater than 100 ounces of water by mouth.  He is not able to tolerate foods or other beverages by mouth, but he does drink water.  He has no nutrition concerns at this time.  NUTRITION DIAGNOSIS:  Diagnosis of inadequate oral intake continues. Diagnosis of food and nutrition-related knowledge deficit has resolved.  INTERVENTION:  Mr. Shahin is to continue Boost Plus via feeding tube, 8 cans daily to provide 2880 calories, 136 g of protein.  He will continue to flush his feeding tube with water and drink water by mouth to meet 100% of his estimated nutritional needs.  Once he is able to swallow some soft solids and other liquids, he will begin to advance diet.   MONITORING, EVALUATION AND GOALS:  The patient continues to tolerate bolus tube feeding at goal rate to meet 100% of his needs.  NEXT VISIT:  Tuesday, June 11th, during chemotherapy.    ______________________________ Zenovia Jarred, RD, LDN Clinical Nutrition Specialist BN/MEDQ  D:  11/15/2011  T:  11/15/2011  Job:  [id number]

## 2011-11-15 NOTE — Progress Notes (Signed)
ID: Steven Robinson   DOB: [birth year]  MR#: [id number]  WUJ#:[id number]  HISTORY OF PRESENT ILLNESS: HPI: The patient noted an enlarging left neck mass early March 2013, and brought this to the attention of his PCP, Dr. Dewain Penning. She obtained a neck CT at [facility] 08/20/2011, which showed multiple enlarged nodes in the left mid-neck along the jugular v., the largest 2.0 cm. She referred the patient to Steven Robinson in ENT and FNA of one of the enlarged nodes was positive for squamous cell carcinoma ([id number]). A p16 stain was read as equivocal. PET scan 09/03/2011 showed SUVs >6 in the left oropharynx and left jugular chain nodes; SUV of 5.49 was noted in the larynx. There was no evidence of contralateral or disseminated disease.  This was consistent with a clinical T1 N2,stage IVA left oropharyngeal SCCA. However multiple biopsies under Dr. Jearld Fenton 09/09/2011 [[id number]] were all negative. The pathologic staging therefore is TX N2, which impacts radiation planning.  The patient met with Drs. Cruzita Lederer, and IMRT with concurrent chemosensitization was planned. The patient had dental evaluation under Dr. Kristin Bruins, several extractions under Dr. Chales Salmon, had audiometry (showing mild high-frequency hearing loss) and met with nutrition. A PEG tube placed by IR unfortunately became dislodged and infected, and had to be replaced through an open procedure under Violeta Gelinas. This delayed the start of chemotherapy and caused a change in plan from the original CDDP at 100 mg/m2 Q3w to 40 mg/M2 weekly. Radiation was started 10/04/2011 and chemotherapy 10/25/2011. The patient's subsequent history is as detailed below.   INTERVAL HISTORY: Steven Robinson is seen today with his wife Steven Robinson for followup of his squamous cell carcinoma of the oropharynx. We reviewed his history to this point including his meeting today with Dr. Basilio Cairo, Zenovia Jarred our nutritionist, and Verdie Mosher our speech pathologist.  REVIEW OF SYSTEMS: Ovila  biggest problem is mucositis. In the morning the pain is 10 out of 10. He takes some hydrocodone for this, and of course also has a 25 mcg/h Duragesic patch. Dr. Basilio Cairo just wrote him for an additional 12.5 mg per hour patch to add to it; for breakthrough pain he is only using hydrocodone elixir. He gets mildly constipated from the narcotics, but is dealing well with that. He is swallowing only water, but does that as often as possible and that is keeping his swallowing mechanism intact. They are very experienced on the management of his gastric tube and Nyxon takes in about 2 cans of boost 4 times a day, which has maintained his weight at about 248 pounds since mid may (his baseline weight was 266 pounds). Aside from the expected skin changes from radiation, a detailed review of systems today was noncontributory, and he tells me he has good energy and is very active around the property. He is only limited and what he can do because he cannot put any stress on the G-tube.  PAST MEDICAL HISTORY: Past Medical History  Diagnosis Date  . Hyperlipoproteinemia   . Obstructive sleep apnea     on C-pap.   [name] GERD (gastroesophageal reflux disease)     chronic  . Hx of colonic polyps   . 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 Vertigo     2/2 intersitial neuritis.   [name] Hearing loss of left ear   . BPH (benign prostatic hyperplasia)   . 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 Hyperlipidemia   . Mucositis 11/01/2011    PAST SURGICAL HISTORY: Past Surgical  History  Procedure Date  . Hernia repair 1990    Left  . Fine needle aspiration 08/26/11     LEFT NECK FNA, LYMPH NODE, METASTATIC SQUAMOUS CELL CARCINOMA  . Gastrostomy 09/24/2011    Procedure: GASTROSTOMY;  Surgeon: Liz Malady, MD;  Location: [facility] OR;  Service: General;  Laterality: N/A;  open G-tube placement procedure start @1219   . Rigid esophagoscopy 09/24/2011    Procedure: RIGID ESOPHAGOSCOPY;  Surgeon: Steven Obey, MD;  Location: [facility] OR;   Service: ENT;  Laterality: N/A;  Esophagoscopy procedure end @1210     FAMILY HISTORY Family History  Problem Relation Age of Onset  . Alzheimer's disease Mother   . Tics Mother   . Hypertension Father   . Cancer Father 28    Prostate  . Stroke Paternal Grandmother   . Cancer Paternal Grandfather     Lung  and Prostate   the patient's father is alive at age 60. He lives in a retirement community. The patient's mother died at the age of 59 in 2009/12/01; she had significant Alzheimer's disease. The patient is an only child.  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 His wife Steven Robinson is a Music therapist in oil. The live on 8 acres and have 2 large horses, a donkey, a Engineer, manufacturing, chickens, etc. The patient's daughter Steven Robinson lives in [place] and is currently looking for a job. The patient's son Steven Robinson of work for an Economist. The patient has 2 grandchildren I., granddaughter age 60 and a grandson aged 8 months. He attends the [employer].  ADVANCED DIRECTIVES: in place  HEALTH MAINTENANCE: History  Substance Use Topics  . Smoking status: Former Smoker -- 0.2 packs/day for 3 years    Quit date: 06/14/1972  . Smokeless tobacco: Not on file  . Alcohol Use: No     Hx of Drinking wine - Stopped 3 years ago     Allergies  Allergen Reactions  . Cyclobenzaprine Hcl Other (See Comments)    confusion    Current Outpatient Prescriptions  Medication Sig Dispense Refill  . ALPRAZolam (XANAX) 0.5 MG tablet prn      . Alum & Mag Hydroxide-Simeth (MAGIC MOUTHWASH W/LIDOCAINE) SOLN Take 10 mLs by mouth 4 (four) times daily as needed (alternate with carafate, take about 30 min before meals and bedtime).  480 mL  5  . aspirin EC 81 MG tablet Take 81 mg by mouth daily.      [name] dexlansoprazole (DEXILANT) 60 MG capsule Take 60 mg by mouth daily.      . diphenhydrAMINE (BENADRYL)  25 MG tablet Take 25 mg by mouth at bedtime.       . Ensure Plus (ENSURE PLUS) LIQD Begin one can of Ensure Plus QID with 60 ml free water before and after each bolus feeding.  In addition, either drink or flush tube with 300 ml free water TID.  If tolerated, increase bolus feedings to 1.5 cans QID on Monday, May 13th. If tolerated, increase to goal of 2 cans Ensure Plus QID.  8 Can  0  . fentaNYL (DURAGESIC - DOSED MCG/HR) 25 MCG/HR Place 1 patch (25 mcg total) onto the skin every 3 (three) days.  10 patch  0  . fluconazole (DIFLUCAN) 100 MG tablet Take 1 tablet (100 mg total) by mouth daily. Take 2 tablets today, then 1 tablet daily for 20 days for Yeast infection  22  tablet  0  . LORazepam (ATIVAN) 0.5 MG tablet Take 0.5 mg by mouth every 6 (six) hours as needed.      [name] LOVAZA 1 G capsule       . ondansetron (ZOFRAN) 8 MG tablet Take by mouth every 12 (twelve) hours as needed. For N/V after 3rd day after chemotherapy      . prochlorperazine (COMPAZINE) 10 MG tablet Take 10 mg by mouth every 6 (six) hours as needed.      . rosuvastatin (CRESTOR) 40 MG tablet Take 40 mg by mouth daily.      . Tamsulosin HCl (FLOMAX) 0.4 MG CAPS Take 0.4 mg by mouth daily.       [name] zolpidem (AMBIEN) 10 MG tablet Take 10 mg by mouth at bedtime.       . sucralfate (CARAFATE) 1 GM/10ML suspension Take 10 mLs (1 g total) by mouth 4 (four) times daily.  420 mL  5    OBJECTIVE: Middle-aged white male who appears remarkably functional under the circumstances Filed Vitals:   11/15/11 1649  BP: 128/77  Pulse: 100  Temp: 98.8 F (37.1 C)     Body mass index is 32.84 kg/(m^2).    ECOG FS: 2 Filed Weights   11/15/11 1649  Weight: 248 lb 14.4 oz (112.9 kg)   Sclerae unicteric Oropharynx shows diffuse mucositis, most easily seen in the soft palate area. There is no obvious thrush. No palpable cervical adenopathy Lungs no rales or rhonchi Heart regular rate and rhythm, no murmur appreciated Abd soft, nontender,  positive bowel sounds; G-tube in place, with no evidence of inflammation, bleeding, or infection. MSK no focal spinal tenderness, no peripheral edema Neuro: nonfocal   LAB RESULTS: Lab Results  Component Value Date   WBC 4.9 11/12/2011   NEUTROABS 3.9 11/12/2011   HGB 12.7* 11/12/2011   HCT 37.7* 11/12/2011   MCV 87.8 11/12/2011   PLT 162 11/12/2011      Chemistry      Component Value Date/Time   NA 139 11/12/2011 1121   K 4.3 11/12/2011 1121   CL 100 11/12/2011 1121   CO2 28 11/12/2011 1121   BUN 17 11/12/2011 1121   CREATININE 0.84 11/12/2011 1121      Component Value Date/Time   CALCIUM 8.9 11/12/2011 1121   ALKPHOS 61 10/20/2011 1215   AST 20 10/20/2011 1215   ALT 19 10/20/2011 1215   BILITOT 0.9 10/20/2011 1215      No results found for this basename: INR:1;PROTIME:1 in the last 168 hours  Urinalysis No results found for this basename: colorurine, appearanceur, labspec, phurine, glucoseu, hgbur, bilirubinur, ketonesur, proteinur, urobilinogen, nitrite, leukocytesur    STUDIES: No new results found.  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 He in addition got a prescription for an additional 12.5 mcg per hour Duragesic patch to add to his current 25 mcg/h. He is doing a good job of avoiding severe  constipation, and has been able to maintain his weight for the last 2 weeks. He is very good at continuing his swallowing exercises, using  chiefly water, and he remains as active as he can given the limitations of having a G-tube in place. Dr. Basilio Cairo added Diflucan today to his mucositis medications. In addition I refill his Zofran and prochlorperazine prescriptions.  He will complete his radiation treatments next Monday. He will receive cis-platinum omorrow full-dose. I don't believe continuing the cisplatin after radiation has been completed would be useful so his final chemotherapy treatment will be June 11. We are looking forward to significant improvement of his mucositis once his active therapy has stopped. Once he is fully able to take solids and liquids without difficulty we will have Dr. Janee Morn remove the G-tube. I would hold off on restaging studies (PET scan and CTs of the neck and chest) until his first anniversary, March of next year.  Jasean is very interested in the chemotherapy program. I have referred him to the [facility] for more details.   Takima Encina C    11/15/2011

## 2011-11-15 NOTE — Progress Notes (Signed)
   Weekly Management Note: Head and neck Cancer Current Dose:   6000cGy  Projected Dose: 7000 cGy   Narrative:  The patient presents for routine under treatment assessment.  CBCT/MVCT images/Port film x-rays were reviewed.  The chart was checked. The mucosal throat pain is getting a little worse and moving anteriorly in his mouth. He is still using fentanyl 25 mcg every 72 hours. His energy is excellent. He has been doing some fairly strenuous yardwork. No other new complaints today. He is applying hydrocortisone in patches to the skin along with Biafine.  Physical Findings:  weight is 247 lb 14.4 oz (112.447 kg). His blood pressure is 116/82 and his pulse is 82. His respiration is 20 and oxygen saturation is 99%.  He is in no acute distress, sitting in a chair. His oropharynx is notable for dry mucous membranes. There are small white patches over the lower anterior gingiva consistent with thrush. The soft palate and remaining oropharynx is notable for confluent mucositis. He demonstrates dry desquamation over his neck with small areas of moist desquamation  He will be reassessed by Dr. Darnelle Catalan today to continue chemotherapy.  CBC    Component Value Date/Time   WBC 4.9 11/12/2011 1121   WBC 8.0 09/24/2011 0450   RBC 4.29 11/12/2011 1121   RBC 4.30 09/24/2011 0450   HGB 12.7* 11/12/2011 1121   HGB 12.8* 09/24/2011 0450   HCT 37.7* 11/12/2011 1121   HCT 37.5* 09/24/2011 0450   PLT 162 11/12/2011 1121   PLT 274 09/24/2011 0450   MCV 87.8 11/12/2011 1121   MCV 87.2 09/24/2011 0450   MCH 29.7 11/12/2011 1121   MCH 29.8 09/24/2011 0450   MCHC 33.8 11/12/2011 1121   MCHC 34.1 09/24/2011 0450   RDW 14.0 11/12/2011 1121   RDW 12.4 09/24/2011 0450   LYMPHSABS 0.1* 11/12/2011 1121   MONOABS 0.7 11/12/2011 1121   EOSABS 0.0 11/12/2011 1121   BASOSABS 0.0 11/12/2011 1121    CMP     Component Value Date/Time   NA 139 11/12/2011 1121   K 4.3 11/12/2011 1121   CL 100 11/12/2011 1121   CO2 28 11/12/2011 1121   GLUCOSE 83 11/12/2011 1121   BUN 17 11/12/2011 1121   CREATININE 0.84 11/12/2011 1121   CALCIUM 8.9 11/12/2011 1121   PROT 6.6 10/20/2011 1215   ALBUMIN 4.2 10/20/2011 1215   AST 20 10/20/2011 1215   ALT 19 10/20/2011 1215   ALKPHOS 61 10/20/2011 1215   BILITOT 0.9 10/20/2011 1215   GFRNONAA >90 09/24/2011 0450   GFRAA >90 09/24/2011 0450     Impression:  The patient is tolerating radiotherapy.  Plan:  Continue radiotherapy as planned. I am prescribing fluconazole for his small patches of thrush and I am recommending that he start taking 37.5 mcg of fentanyl.

## 2011-11-15 NOTE — Progress Notes (Signed)
Patient alert,oriented x3, bright erythema on neck, dry desquamation in 2-3 places,still using biafine cream, baking soda rinses and mmw rinses, Carafate stopped made  Him nauseated, using 25 mcg patch on right side abdomen, pain still 8 in throat amnd mouth, difficult swallowing,  Drinks water>100 ounces daily , peg tube feedings boost plus 2 cartons(237ml) 4x day, water flush 180cc 4x day, completed 30/35 txs

## 2011-11-16 ENCOUNTER — Ambulatory Visit (HOSPITAL_BASED_OUTPATIENT_CLINIC_OR_DEPARTMENT_OTHER): Payer: Managed Care, Other (non HMO)

## 2011-11-16 ENCOUNTER — Ambulatory Visit
Admission: RE | Admit: 2011-11-16 | Discharge: 2011-11-16 | Disposition: A | Discharge status: Home / Self Care | Payer: Managed Care, Other (non HMO) | Source: Ambulatory Visit | Attending: Radiation Oncology | Admitting: Radiation Oncology

## 2011-11-16 DIAGNOSIS — Z5111 Encounter for antineoplastic chemotherapy: Secondary | ICD-10-CM

## 2011-11-16 DIAGNOSIS — C760 Malignant neoplasm of head, face and neck: Secondary | ICD-10-CM

## 2011-11-16 DIAGNOSIS — C770 Secondary and unspecified malignant neoplasm of lymph nodes of head, face and neck: Secondary | ICD-10-CM

## 2011-11-16 MED ORDER — DEXTROSE-NACL 5-0.45 % IV SOLN
Freq: Once | INTRAVENOUS | Status: AC
  Administered 2011-11-16: 10:00:00 via INTRAVENOUS
  Filled 2011-11-16: qty 10

## 2011-11-16 MED ORDER — SODIUM CHLORIDE 0.9 % IV SOLN
40.0000 mg/m2 | Freq: Once | INTRAVENOUS | Status: AC
  Administered 2011-11-16: 99 mg via INTRAVENOUS
  Filled 2011-11-16: qty 99

## 2011-11-16 MED ORDER — SODIUM CHLORIDE 0.9 % IV SOLN: Freq: Once | INTRAVENOUS | Status: DC

## 2011-11-16 MED ORDER — ONDANSETRON 16 MG/50ML IVPB (CHCC)
16.0000 mg | Freq: Once | INTRAVENOUS | Status: AC
  Administered 2011-11-16: 16 mg via INTRAVENOUS

## 2011-11-16 MED ORDER — DEXAMETHASONE SODIUM PHOSPHATE 4 MG/ML IJ SOLN
20.0000 mg | Freq: Once | INTRAMUSCULAR | Status: AC
  Administered 2011-11-16: 20 mg via INTRAVENOUS

## 2011-11-16 NOTE — Patient Instructions (Signed)
[facility] Discharge Instructions for Patients Receiving Chemotherapy  Today you received the following chemotherapy agents Cisplatin  To help prevent nausea and vomiting after your treatment, we encourage you to take your nausea medication.  Begin taking it at 7pm and take it as often as prescribed for the next 24-72 hours.   If you develop nausea and vomiting that is not controlled by your nausea medication, call the clinic. If it is after clinic hours your family physician or the after hours number for the clinic or go to the Emergency Department.   BELOW ARE SYMPTOMS THAT SHOULD BE REPORTED IMMEDIATELY:  *FEVER GREATER THAN 100.5 F  *CHILLS WITH OR WITHOUT FEVER  NAUSEA AND VOMITING THAT IS NOT CONTROLLED WITH YOUR NAUSEA MEDICATION  *UNUSUAL SHORTNESS OF BREATH  *UNUSUAL BRUISING OR BLEEDING  TENDERNESS IN MOUTH AND THROAT WITH OR WITHOUT PRESENCE OF ULCERS  *URINARY PROBLEMS  *BOWEL PROBLEMS  UNUSUAL RASH Items with * indicate a potential emergency and should be followed up as soon as possible.  One of the nurses will contact you 24 hours after your treatment. Please let the nurse know about any problems that you may have experienced. Feel free to call the clinic you have any questions or concerns. The clinic phone number is [phone].   I have been informed and understand all the instructions given to me. I know to contact the clinic, my physician, or go to the Emergency Department if any problems should occur. I do not have any questions at this time, but understand that I may call the clinic during office hours   should I have any questions or need assistance in obtaining follow up care.    __________________________________________  _____________  __________ Signature of Patient or Authorized Representative            Date                   Time    __________________________________________ Nurse's Signature

## 2011-11-16 NOTE — Progress Notes (Signed)
Pt tolerated Cisplatin well. He voided a total of 425 prior to treatment and a total of 575 post treatment. He verbalized understanding to continue to drink plenty of fluids and emptying his bladder.

## 2011-11-17 ENCOUNTER — Ambulatory Visit
Admission: RE | Admit: 2011-11-17 | Discharge: 2011-11-17 | Disposition: A | Discharge status: Home / Self Care | Payer: Managed Care, Other (non HMO) | Source: Ambulatory Visit | Attending: Radiation Oncology | Admitting: Radiation Oncology

## 2011-11-17 DIAGNOSIS — C770 Secondary and unspecified malignant neoplasm of lymph nodes of head, face and neck: Secondary | ICD-10-CM

## 2011-11-17 MED ORDER — BIAFINE EX EMUL
CUTANEOUS | Status: DC | PRN
  Administered 2011-11-17: 12:00:00 via TOPICAL

## 2011-11-17 MED ORDER — BIAFINE EX EMUL: CUTANEOUS | Status: DC | PRN

## 2011-11-18 ENCOUNTER — Ambulatory Visit
Admission: RE | Admit: 2011-11-18 | Discharge: 2011-11-18 | Disposition: A | Discharge status: Home / Self Care | Payer: Managed Care, Other (non HMO) | Source: Ambulatory Visit | Attending: Radiation Oncology | Admitting: Radiation Oncology

## 2011-11-19 ENCOUNTER — Ambulatory Visit
Admission: RE | Admit: 2011-11-19 | Discharge: 2011-11-19 | Disposition: A | Discharge status: Home / Self Care | Payer: Managed Care, Other (non HMO) | Source: Ambulatory Visit | Attending: Radiation Oncology | Admitting: Radiation Oncology

## 2011-11-19 ENCOUNTER — Other Ambulatory Visit (HOSPITAL_BASED_OUTPATIENT_CLINIC_OR_DEPARTMENT_OTHER): Payer: Managed Care, Other (non HMO)

## 2011-11-19 DIAGNOSIS — C760 Malignant neoplasm of head, face and neck: Secondary | ICD-10-CM

## 2011-11-19 LAB — CBC WITH DIFFERENTIAL/PLATELET
BASO%: 0.6 % (ref 0.0–2.0)
EOS%: 0.7 % (ref 0.0–7.0)
HGB: 12 g/dL — ABNORMAL LOW (ref 13.0–17.1)
MCH: 29.6 pg (ref 27.2–33.4)
MCHC: 33.7 g/dL (ref 32.0–36.0)
RBC: 4.07 10*6/uL — ABNORMAL LOW (ref 4.20–5.82)
RDW: 14.5 % (ref 11.0–14.6)
lymph#: 0.1 10*3/uL — ABNORMAL LOW (ref 0.9–3.3)

## 2011-11-19 LAB — BASIC METABOLIC PANEL
BUN: 21 mg/dL (ref 6–23)
CO2: 29 mEq/L (ref 19–32)
Calcium: 8.7 mg/dL (ref 8.4–10.5)
Creatinine, Ser: 0.75 mg/dL (ref 0.50–1.35)

## 2011-11-22 ENCOUNTER — Ambulatory Visit: Payer: Self-pay | Admitting: Oncology

## 2011-11-22 ENCOUNTER — Telehealth: Payer: Self-pay | Admitting: Oncology

## 2011-11-22 ENCOUNTER — Encounter: Payer: Self-pay | Admitting: Radiation Oncology

## 2011-11-22 ENCOUNTER — Ambulatory Visit
Admission: RE | Admit: 2011-11-22 | Discharge: 2011-11-22 | Disposition: A | Discharge status: Home / Self Care | Payer: Managed Care, Other (non HMO) | Source: Ambulatory Visit | Attending: Radiation Oncology | Admitting: Radiation Oncology

## 2011-11-22 ENCOUNTER — Encounter: Payer: Self-pay | Admitting: Physician Assistant

## 2011-11-22 ENCOUNTER — Ambulatory Visit (HOSPITAL_BASED_OUTPATIENT_CLINIC_OR_DEPARTMENT_OTHER): Payer: Managed Care, Other (non HMO) | Admitting: Physician Assistant

## 2011-11-22 VITALS — BP 117/69 | HR 78 | Temp 98.1°F | Ht 73.0 in | Wt 242.2 lb

## 2011-11-22 VITALS — BP 114/75 | HR 84 | Temp 97.2°F | Wt 242.3 lb

## 2011-11-22 DIAGNOSIS — C109 Malignant neoplasm of oropharynx, unspecified: Secondary | ICD-10-CM

## 2011-11-22 DIAGNOSIS — C770 Secondary and unspecified malignant neoplasm of lymph nodes of head, face and neck: Secondary | ICD-10-CM

## 2011-11-22 DIAGNOSIS — C760 Malignant neoplasm of head, face and neck: Secondary | ICD-10-CM

## 2011-11-22 DIAGNOSIS — K1230 Oral mucositis (ulcerative), unspecified: Secondary | ICD-10-CM

## 2011-11-22 MED ORDER — RADIAPLEXRX EX GEL
Freq: Once | CUTANEOUS | Status: AC
  Administered 2011-11-22: 18:00:00 via TOPICAL

## 2011-11-22 NOTE — Progress Notes (Signed)
Completes today.  35/35 Fractions to neck.  Hoarseness noted.  Skin intact with bright erythema lower face, neck(front and back), and chest.  C/o pain at level 6-7 instead 9-10 now.  Difficulty opening mouth but states he is continuing oral exercises per Almira Coaster and seen by him last week.  Will see Baldo Ash again in July 2013.  Oral mucosa red but intact.   Instilling 8 cans of boost plus via peg feeding tube.  Drinking liquids without difficulty.

## 2011-11-22 NOTE — Progress Notes (Signed)
Encounter addended by: Amanda Pea, RN on: 11/22/2011  6:10 PM<BR>     Documentation filed: Inpatient MAR, Orders

## 2011-11-22 NOTE — Progress Notes (Signed)
ID: Steven Robinson   DOB: Dec 11, 1950  MR#: [id number]  NWG#:[id number]  HISTORY OF PRESENT ILLNESS: HPI: The patient noted an enlarging left neck mass early March 2013, and brought this to the attention of his PCP, Dr. Dewain Robinson. She obtained a neck CT at [facility] 08/20/2011, which showed multiple enlarged nodes in the left mid-neck along the jugular v., the largest 2.0 cm. She referred the patient to Steven Robinson in ENT and FNA of one of the enlarged nodes was positive for squamous cell carcinoma ([id number]). A p16 stain was read as equivocal. PET scan 09/03/2011 showed SUVs >6 in the left oropharynx and left jugular chain nodes; SUV of 5.49 was noted in the larynx. There was no evidence of contralateral or disseminated disease.  This was consistent with a clinical T1 N2,stage IVA left oropharyngeal SCCA. However multiple biopsies under Dr. Jearld Fenton 09/09/2011 [[id number]] were all negative. The pathologic staging therefore is TX N2, which impacts radiation planning.  The patient met with Drs. Steven Robinson, and IMRT with concurrent chemosensitization was planned. The patient had dental evaluation under Dr. Kristin Robinson, several extractions under Dr. Chales Robinson, had audiometry (showing mild high-frequency hearing loss) and met with nutrition. A PEG tube placed by IR unfortunately became dislodged and infected, and had to be replaced through an open procedure under Steven Robinson. This delayed the start of chemotherapy and caused a change in plan from the original CDDP at 100 mg/m2 Q3w to 40 mg/M2 weekly. Radiation was started 10/04/2011 and chemotherapy 10/25/2011. The patient's subsequent history is as detailed below.   INTERVAL HISTORY: Steven Robinson is seen today with his wife Steven Robinson for followup of his squamous cell carcinoma of the oropharynx. He is due for his last treatment of radiation therapy today, and is scheduled for his last weekly dose of cisplatin tomorrow, June 11.   REVIEW OF SYSTEMS: Steven Robinson is feeling well, although  he continues to have mucositis with associated pain. He is using the Duragesic patch as well as the Dilaudid prescribed by Steven Robinson. This has helped significantly with his pain management. He is still doing his swallowing exercises, swallowing only water at this point. Also uses Magic mouthwash as needed.  Steven Robinson denies any fevers or chills. He has some occasional mild nausea, but no emesis. He is managing to have regular bowel movements despite narcotic pain medications. He denies any signs of abnormal bleeding whatsoever. He has some slight skin changes around the neck secondary to radiation therapy, but no skin breakdown.  No abnormal headaches or dizziness. No chest pain or shortness of breath. No peripheral swelling, and no unusual myalgias, arthralgias, or bony pain. No abdominal pain.  A detailed review of systems is otherwise noncontributory.   PAST MEDICAL HISTORY: Past Medical History  Diagnosis Date  . Hyperlipoproteinemia   . Obstructive sleep apnea     on C-pap.   [name] GERD (gastroesophageal reflux disease)     chronic  . Hx of colonic polyps   . 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 Vertigo     2/2 intersitial neuritis.   [name] Hearing loss of left ear   . BPH (benign prostatic hyperplasia)   . 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 Hyperlipidemia   . Mucositis 11/01/2011    PAST SURGICAL HISTORY: Past Surgical History  Procedure Date  . Hernia repair 1990    Left  . Fine needle aspiration 08/26/11     LEFT NECK FNA, LYMPH NODE, METASTATIC SQUAMOUS CELL CARCINOMA  . Gastrostomy  09/24/2011    Procedure: GASTROSTOMY;  Surgeon: Liz Malady, MD;  Location: [facility] OR;  Service: General;  Laterality: N/A;  open G-tube placement procedure start @1219   . Rigid esophagoscopy 09/24/2011    Procedure: RIGID ESOPHAGOSCOPY;  Surgeon: Steven Obey, MD;  Location: [facility] OR;  Service: ENT;  Laterality: N/A;  Esophagoscopy procedure end @1210     FAMILY HISTORY Family History  Problem  Relation Age of Onset  . Alzheimer's disease Mother   . Tics Mother   . Hypertension Father   . Cancer Father 68    Prostate  . Stroke Paternal Grandmother   . Cancer Paternal Grandfather     Lung  and Prostate   the patient's father is alive at age 77. He lives in a retirement community. The patient's mother died at the age of 84 in 21-Nov-2009; she had significant Alzheimer's disease. The patient is an only child.  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 His wife Steven Robinson is a Music therapist in oil. The live on 8 acres and have 2 large horses, a donkey, a Engineer, manufacturing, chickens, etc. The patient's daughter Steven Robinson lives in [place] and is currently looking for a job. The patient's son Steven Robinson of work for an Economist. The patient has 2 grandchildren I., granddaughter age 3 and a grandson aged 8 months. He attends the [employer].  ADVANCED DIRECTIVES: in place  HEALTH MAINTENANCE: History  Substance Use Topics  . Smoking status: Former Smoker -- 0.2 packs/day for 3 years    Quit date: 06/14/1972  . Smokeless tobacco: Not on file  . Alcohol Use: No     Hx of Drinking wine - Stopped 3 years ago     Allergies  Allergen Reactions  . Cyclobenzaprine Hcl Other (See Comments)    confusion    Current Outpatient Prescriptions  Medication Sig Dispense Refill  . ALPRAZolam (XANAX) 0.5 MG tablet prn      . Alum & Mag Hydroxide-Simeth (MAGIC MOUTHWASH W/LIDOCAINE) SOLN Take 10 mLs by mouth 4 (four) times daily as needed (alternate with carafate, take about 30 min before meals and bedtime).  480 mL  5  . aspirin EC 81 MG tablet Take 81 mg by mouth daily.      [name] dexlansoprazole (DEXILANT) 60 MG capsule Take 60 mg by mouth daily.      . diphenhydrAMINE (BENADRYL) 25 MG tablet Take 25 mg by mouth at bedtime.       . Ensure Plus (ENSURE PLUS) LIQD Begin one can of Ensure Plus  QID with 60 ml free water before and after each bolus feeding.  In addition, either drink or flush tube with 300 ml free water TID.  If tolerated, increase bolus feedings to 1.5 cans QID on Monday, May 13th. If tolerated, increase to goal of 2 cans Ensure Plus QID.  8 Can  0  . fentaNYL (DURAGESIC - DOSED MCG/HR) 25 MCG/HR Place 1 patch (25 mcg total) onto the skin every 3 (three) days.  10 patch  0  . fluconazole (DIFLUCAN) 100 MG tablet Take 1 tablet (100 mg total) by mouth daily. Take 2 tablets today, then 1 tablet daily for 20 days for Yeast infection  22 tablet  0  . HYDROcodone-acetaminophen (LORTAB) 7.5-500 MG/15ML solution       . HYDROmorphone (DILAUDID) 4 MG tablet       . LORazepam (ATIVAN) 0.5 MG tablet Take  0.5 mg by mouth every 6 (six) hours as needed.      [name] LOVAZA 1 G capsule       . ondansetron (ZOFRAN) 8 MG tablet Take by mouth every 12 (twelve) hours as needed. For N/V after 3rd day after chemotherapy      . prochlorperazine (COMPAZINE) 10 MG tablet Take 10 mg by mouth every 6 (six) hours as needed.      . rosuvastatin (CRESTOR) 40 MG tablet Take 40 mg by mouth daily.      . sucralfate (CARAFATE) 1 GM/10ML suspension Take 10 mLs (1 g total) by mouth 4 (four) times daily.  420 mL  5  . Tamsulosin HCl (FLOMAX) 0.4 MG CAPS Take 0.4 mg by mouth daily.       [name] zolpidem (AMBIEN) 10 MG tablet Take 10 mg by mouth at bedtime.         OBJECTIVE: Middle-aged white male who appears remarkably functional under the circumstances Filed Vitals:   11/22/11 0950  BP: 117/69  Pulse: 78  Temp: 98.1 F (36.7 C)     Body mass index is 31.95 kg/(m^2).    ECOG FS: 2 Filed Weights   11/22/11 0950  Weight: 242 lb 3.2 oz (109.861 kg)   Filed Weights   11/22/11 0950  Weight: 242 lb 3.2 oz (109.861 kg)   Physical Exam: HEENT:  Sclerae anicteric, conjunctivae pink.  Oropharynx is notable for diffuse mucositis with ulcerations bilaterally in the buccal mucosa. No evidence of thrush currently.    Lungs:  Clear to auscultation bilaterally.  No crackles, rhonchi, or wheezes.   Heart:  Regular rate and rhythm.   Abdomen:  Soft, nontender.  Positive bowel sounds. G-tube is in place with no erythema, erythema, drainage, or evidence of infection. Musculoskeletal:  No focal spinal tenderness to palpation.  Extremities:  Benign.  No peripheral edema or cyanosis.   Skin:  Benign with the exception of erythema associated with radiation changes around the neck and upper shoulders. No moist desquamation noted.  Neuro:  Nonfocal. Alert and oriented x3.    LAB RESULTS: Lab Results  Component Value Date   WBC 4.2 11/19/2011   NEUTROABS 3.5 11/19/2011   HGB 12.0* 11/19/2011   HCT 35.7* 11/19/2011   MCV 87.7 11/19/2011   PLT 103* 11/19/2011      Chemistry      Component Value Date/Time   NA 135 11/19/2011 1120   K 4.3 11/19/2011 1120   CL 98 11/19/2011 1120   CO2 29 11/19/2011 1120   BUN 21 11/19/2011 1120   CREATININE 0.75 11/19/2011 1120      Component Value Date/Time   CALCIUM 8.7 11/19/2011 1120   ALKPHOS 61 10/20/2011 1215   AST 20 10/20/2011 1215   ALT 19 10/20/2011 1215   BILITOT 0.9 10/20/2011 1215       STUDIES: No new results found.  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 2 return to see Steven Robinson for  routine followup in approximately 3 weeks, at which time we will discuss long-term followup. In the meanwhile, he is also scheduled to meet with our nutritionist,  Zenovia Jarred.  According to Steven Robinson previous notes, we will have Dr. Janee Morn remove the G-tube as soon as Hjalmar is able to take solids and liquids without difficulty. We will likely restage with PET scan and CTs of the neck and chest in March of 2014, his 1 year anniversary.  Lyman and his wife both voice understanding and agreement with our plan, and will call with any changes or problems.  Zelma Mazariego    11/22/2011

## 2011-11-22 NOTE — Progress Notes (Signed)
   Weekly Management Note: Head and neck cancer, unknown primary. Current Dose:   7000 cGy  Projected Dose:  7000 cGy   Narrative:  The patient presents for routine under treatment assessment.  CBCT/MVCT images/Port film x-rays were reviewed.  The chart was checked. He is doing remarkably well. No new complaints compared to last week. Continues to have mucosal pain. Energy is excellent. Spirits are very good.  Physical Findings:  weight is 242 lb 4.8 oz (109.907 kg). His temperature is 97.2 F (36.2 C). His blood pressure is 114/75 and his pulse is 84.  no oral thrush. Mucous membranes are dry. Confluent mucositis is apparent in the oropharynx. No palpable adenopathy in the neck. He has diffuse erythema throughout the neck with small areas of desquamation  CBC    Component Value Date/Time   WBC 4.2 11/19/2011 1120   WBC 8.0 09/24/2011 0450   RBC 4.07* 11/19/2011 1120   RBC 4.30 09/24/2011 0450   HGB 12.0* 11/19/2011 1120   HGB 12.8* 09/24/2011 0450   HCT 35.7* 11/19/2011 1120   HCT 37.5* 09/24/2011 0450   PLT 103* 11/19/2011 1120   PLT 274 09/24/2011 0450   MCV 87.7 11/19/2011 1120   MCV 87.2 09/24/2011 0450   MCH 29.6 11/19/2011 1120   MCH 29.8 09/24/2011 0450   MCHC 33.7 11/19/2011 1120   MCHC 34.1 09/24/2011 0450   RDW 14.5 11/19/2011 1120   RDW 12.4 09/24/2011 0450   LYMPHSABS 0.1* 11/19/2011 1120   MONOABS 0.5 11/19/2011 1120   EOSABS 0.0 11/19/2011 1120   BASOSABS 0.0 11/19/2011 1120     CMP     Component Value Date/Time   NA 135 11/19/2011 1120   K 4.3 11/19/2011 1120   CL 98 11/19/2011 1120   CO2 29 11/19/2011 1120   GLUCOSE 87 11/19/2011 1120   BUN 21 11/19/2011 1120   CREATININE 0.75 11/19/2011 1120   CALCIUM 8.7 11/19/2011 1120   PROT 6.6 10/20/2011 1215   ALBUMIN 4.2 10/20/2011 1215   AST 20 10/20/2011 1215   ALT 19 10/20/2011 1215   ALKPHOS 61 10/20/2011 1215   BILITOT 0.9 10/20/2011 1215   GFRNONAA >90 09/24/2011 0450   GFRAA >90 09/24/2011 0450     Impression:  The patient has tolerated chemoradiotherapy     Plan: Final cycle of cis-platinum scheduled for tomorrow. Followup with me will be on July 1 so that patient can coordinate his visits with me and medical oncology. I talked to the patient about the pros and cons of undergoing a PET scan in the next 3-4 months as opposed to March of 2014. The patient and I feel more comfortable with getting closer followup so that in case there is evidence of persistent disease or recurrence, he can be addressed more proactively. He understands that there is a chance of false positive on his PET scan. Sometimes, persistent hypermetabolic activity is followed with subsequent scans but not necessarily biopsied.  Therefore, we will anticipate a PET/CT scan approximately at the end of September 2013 or early October.   ________________________________   Lonie Peak, M.D.

## 2011-11-22 NOTE — Telephone Encounter (Signed)
gv pt appt schedule for June/July. 

## 2011-11-23 ENCOUNTER — Other Ambulatory Visit (HOSPITAL_BASED_OUTPATIENT_CLINIC_OR_DEPARTMENT_OTHER): Payer: Managed Care, Other (non HMO) | Admitting: Lab

## 2011-11-23 ENCOUNTER — Telehealth: Payer: Self-pay | Admitting: *Deleted

## 2011-11-23 ENCOUNTER — Other Ambulatory Visit: Payer: Self-pay | Admitting: Physician Assistant

## 2011-11-23 ENCOUNTER — Ambulatory Visit (HOSPITAL_BASED_OUTPATIENT_CLINIC_OR_DEPARTMENT_OTHER): Payer: Managed Care, Other (non HMO)

## 2011-11-23 ENCOUNTER — Ambulatory Visit: Payer: Managed Care, Other (non HMO) | Admitting: Nutrition

## 2011-11-23 VITALS — BP 118/77 | HR 85 | Temp 98.5°F

## 2011-11-23 DIAGNOSIS — D696 Thrombocytopenia, unspecified: Secondary | ICD-10-CM

## 2011-11-23 DIAGNOSIS — C770 Secondary and unspecified malignant neoplasm of lymph nodes of head, face and neck: Secondary | ICD-10-CM

## 2011-11-23 DIAGNOSIS — C109 Malignant neoplasm of oropharynx, unspecified: Secondary | ICD-10-CM

## 2011-11-23 DIAGNOSIS — Z5111 Encounter for antineoplastic chemotherapy: Secondary | ICD-10-CM

## 2011-11-23 DIAGNOSIS — C760 Malignant neoplasm of head, face and neck: Secondary | ICD-10-CM

## 2011-11-23 DIAGNOSIS — C801 Malignant (primary) neoplasm, unspecified: Secondary | ICD-10-CM

## 2011-11-23 LAB — COMPREHENSIVE METABOLIC PANEL
AST: 21 U/L (ref 0–37)
Alkaline Phosphatase: 62 U/L (ref 39–117)
Glucose, Bld: 87 mg/dL (ref 70–99)
Potassium: 4.1 mEq/L (ref 3.5–5.3)
Sodium: 136 mEq/L (ref 135–145)
Total Bilirubin: 0.6 mg/dL (ref 0.3–1.2)
Total Protein: 7.2 g/dL (ref 6.0–8.3)

## 2011-11-23 LAB — CBC WITH DIFFERENTIAL/PLATELET
BASO%: 1.9 % (ref 0.0–2.0)
EOS%: 1.4 % (ref 0.0–7.0)
Eosinophils Absolute: 0 10*3/uL (ref 0.0–0.5)
LYMPH%: 10.4 % — ABNORMAL LOW (ref 14.0–49.0)
MCH: 28.9 pg (ref 27.2–33.4)
MCHC: 33.9 g/dL (ref 32.0–36.0)
MCV: 85.3 fL (ref 79.3–98.0)
MONO%: 13.2 % (ref 0.0–14.0)
Platelets: 83 10*3/uL — ABNORMAL LOW (ref 140–400)
RBC: 3.94 10*6/uL — ABNORMAL LOW (ref 4.20–5.82)
RDW: 14.4 % (ref 11.0–14.6)

## 2011-11-23 MED ORDER — DEXAMETHASONE SODIUM PHOSPHATE 4 MG/ML IJ SOLN
20.0000 mg | Freq: Once | INTRAMUSCULAR | Status: AC
  Administered 2011-11-23: 20 mg via INTRAVENOUS

## 2011-11-23 MED ORDER — SODIUM CHLORIDE 0.9 % IV SOLN
Freq: Once | INTRAVENOUS | Status: AC
  Administered 2011-11-23: 09:00:00 via INTRAVENOUS

## 2011-11-23 MED ORDER — POTASSIUM CHLORIDE 2 MEQ/ML IV SOLN
Freq: Once | INTRAVENOUS | Status: AC
  Administered 2011-11-23: 10:00:00 via INTRAVENOUS
  Filled 2011-11-23: qty 10

## 2011-11-23 MED ORDER — SODIUM CHLORIDE 0.9 % IV SOLN
40.0000 mg/m2 | Freq: Once | INTRAVENOUS | Status: AC
  Administered 2011-11-23: 99 mg via INTRAVENOUS
  Filled 2011-11-23: qty 99

## 2011-11-23 MED ORDER — ONDANSETRON 16 MG/50ML IVPB (CHCC)
16.0000 mg | Freq: Once | INTRAVENOUS | Status: AC
  Administered 2011-11-23: 16 mg via INTRAVENOUS

## 2011-11-23 NOTE — Progress Notes (Signed)
Steven Robinson has actually lost weight and his weight was documented as 242.3 pounds June 10th from 247.9 pounds June 3rd.  The patient reports he struggled with a little more nausea this past week and there were 2 days where he only was able to tolerate 6 cans of Boost Plus instead of 8 cans, which is his goal rate.  He continues to tolerate his protein supplement, as well as water by mouth and water in his tube.  He tells me today that he has removed his pain patch and is going to rely on oral pain medications for pain control in the hopes that it will help with his nausea.  The patient does state that he is taking his nausea medication as prescribed.  NUTRITION DIAGNOSIS:  Inadequate oral intake continues.  INTERVENTION:  I have encouraged Steven Robinson to find a balance between pain control and nausea and to be sure he is taking his medications as prescribed, as well as communicating with his healthcare team if this is not controlling his pain and nausea.  He is to work to increase Boost Plus back to goal rate on a continual basis of 8 cans daily, along with his protein supplement and free water flushes to meet 100% of his estimated needs.  The patient verbalizes understanding and agreement to comply.  MONITORING/EVALUATION/GOALS:  The patient has had some issues tolerating bolus feedings due to nausea.  He will work to increase tube feedings back to goal rate to minimize further weight loss.  NEXT VISIT:  There is no followup scheduled at this time, as Steven Robinson has no more chemotherapy or radiation treatments scheduled.  He has my contact information and will call me with questions or concerns.    ______________________________ Zenovia Jarred, RD, LDN Clinical Nutrition Specialist BN/MEDQ  D:  11/23/2011  T:  11/23/2011  Job:  [phone]

## 2011-11-23 NOTE — Progress Notes (Signed)
Pt urinated total of 900 cc from 1:30 pm until Discharge from infusion room.

## 2011-11-23 NOTE — Progress Notes (Signed)
Steven Robinson returns today for treatment, his last weekly dose of cisplatin, having completed radiation therapy yesterday on June 10. The CBC today shows a reduced platelet count of 83,000. The patient was asymptomatic when evaluated here yesterday in the clinic.  These labs and this case have been reviewed with Dr. Darnelle Catalan. He suggests proceeding with cisplatin as scheduled, but we will repeat the patient's CBC next week on 12/02/2011.  Zollie Scale, PA-C 11/23/2011

## 2011-11-23 NOTE — Patient Instructions (Signed)
[facility] Discharge Instructions for Patients Receiving Chemotherapy  Today you received the following chemotherapy agents ; Cisplatin.  To help prevent nausea and vomiting after your treatment, we encourage you to take your nausea medication; Zofran (ondansetron), Compazine (prochloraperzine), and Ativan (lorazepam) as directed.   If you develop nausea and vomiting that is not controlled by your nausea medication, call the clinic. If it is after clinic hours your family physician or the after hours number for the clinic or go to the Emergency Department.   BELOW ARE SYMPTOMS THAT SHOULD BE REPORTED IMMEDIATELY:  *FEVER GREATER THAN 100.5 F  *CHILLS WITH OR WITHOUT FEVER  NAUSEA AND VOMITING THAT IS NOT CONTROLLED WITH YOUR NAUSEA MEDICATION  *UNUSUAL SHORTNESS OF BREATH  *UNUSUAL BRUISING OR BLEEDING  TENDERNESS IN MOUTH AND THROAT WITH OR WITHOUT PRESENCE OF ULCERS  *URINARY PROBLEMS  *BOWEL PROBLEMS  UNUSUAL RASH Items with * indicate a potential emergency and should be followed up as soon as possible.   Please let the nurse know about any problems that you may have experienced. Feel free to call the clinic you have any questions or concerns. The clinic phone number is [phone].   I have been informed and understand all the instructions given to me. I know to contact the clinic, my physician, or go to the Emergency Department if any problems should occur. I do not have any questions at this time, but understand that I may call the clinic during office hours   should I have any questions or need assistance in obtaining follow up care.    __________________________________________  _____________  __________ Signature of Patient or Authorized Representative            Date                   Time    __________________________________________ Nurse's Signature

## 2011-11-23 NOTE — Telephone Encounter (Signed)
gave patient appointment for 12-02-2011 lab only appointment gave to the patient in the chemo room

## 2011-11-26 ENCOUNTER — Other Ambulatory Visit: Payer: Self-pay | Admitting: Medical Oncology

## 2011-11-26 MED ORDER — HYDROMORPHONE HCL 4 MG PO TABS: 4.0000 mg | ORAL_TABLET | Freq: Two times a day (BID) | ORAL | Status: DC | PRN

## 2011-11-29 ENCOUNTER — Other Ambulatory Visit: Payer: Self-pay | Admitting: *Deleted

## 2011-11-29 ENCOUNTER — Ambulatory Visit (HOSPITAL_BASED_OUTPATIENT_CLINIC_OR_DEPARTMENT_OTHER): Payer: Managed Care, Other (non HMO)

## 2011-11-29 ENCOUNTER — Telehealth: Payer: Self-pay | Admitting: Nutrition

## 2011-11-29 ENCOUNTER — Encounter: Payer: Self-pay | Admitting: Physician Assistant

## 2011-11-29 ENCOUNTER — Other Ambulatory Visit: Payer: Self-pay | Admitting: Medical Oncology

## 2011-11-29 ENCOUNTER — Ambulatory Visit (HOSPITAL_BASED_OUTPATIENT_CLINIC_OR_DEPARTMENT_OTHER): Payer: Managed Care, Other (non HMO) | Admitting: Physician Assistant

## 2011-11-29 ENCOUNTER — Other Ambulatory Visit: Payer: Self-pay | Admitting: Physician Assistant

## 2011-11-29 ENCOUNTER — Ambulatory Visit: Payer: Self-pay | Admitting: Oncology

## 2011-11-29 VITALS — BP 112/70 | HR 92 | Temp 98.3°F

## 2011-11-29 DIAGNOSIS — R112 Nausea with vomiting, unspecified: Secondary | ICD-10-CM

## 2011-11-29 DIAGNOSIS — C760 Malignant neoplasm of head, face and neck: Secondary | ICD-10-CM

## 2011-11-29 DIAGNOSIS — C770 Secondary and unspecified malignant neoplasm of lymph nodes of head, face and neck: Secondary | ICD-10-CM

## 2011-11-29 DIAGNOSIS — D709 Neutropenia, unspecified: Secondary | ICD-10-CM

## 2011-11-29 DIAGNOSIS — K121 Other forms of stomatitis: Secondary | ICD-10-CM

## 2011-11-29 DIAGNOSIS — E46 Unspecified protein-calorie malnutrition: Secondary | ICD-10-CM

## 2011-11-29 LAB — CBC WITH DIFFERENTIAL/PLATELET
Basophils Absolute: 0 10*3/uL (ref 0.0–0.1)
HCT: 30.2 % — ABNORMAL LOW (ref 38.4–49.9)
HGB: 10.1 g/dL — ABNORMAL LOW (ref 13.0–17.1)
MONO#: 0.2 10*3/uL (ref 0.1–0.9)
NEUT%: 69.1 % (ref 39.0–75.0)
Platelets: 109 10*3/uL — ABNORMAL LOW (ref 140–400)
WBC: 1 10*3/uL — ABNORMAL LOW (ref 4.0–10.3)
lymph#: 0.1 10*3/uL — ABNORMAL LOW (ref 0.9–3.3)

## 2011-11-29 LAB — COMPREHENSIVE METABOLIC PANEL
BUN: 17 mg/dL (ref 6–23)
CO2: 28 mEq/L (ref 19–32)
Calcium: 8.9 mg/dL (ref 8.4–10.5)
Chloride: 98 mEq/L (ref 96–112)
Creatinine, Ser: 0.75 mg/dL (ref 0.50–1.35)
Glucose, Bld: 94 mg/dL (ref 70–99)

## 2011-11-29 MED ORDER — PROMETHAZINE HCL 25 MG RE SUPP: 25.0000 mg | Freq: Four times a day (QID) | RECTAL | Status: DC | PRN

## 2011-11-29 MED ORDER — SODIUM CHLORIDE 0.9 % IV SOLN
INTRAVENOUS | Status: DC
  Administered 2011-11-29: 14:00:00 via INTRAVENOUS

## 2011-11-29 MED ORDER — METOCLOPRAMIDE HCL 5 MG/ML IJ SOLN
10.0000 mg | Freq: Once | INTRAMUSCULAR | Status: AC
  Administered 2011-11-29: 10 mg via INTRAVENOUS

## 2011-11-29 MED ORDER — LORAZEPAM 2 MG/ML IJ SOLN
0.5000 mg | Freq: Once | INTRAMUSCULAR | Status: AC
  Administered 2011-11-29: 0.5 mg via INTRAVENOUS

## 2011-11-29 MED ORDER — AMOXICILLIN-POT CLAVULANATE 400-57 MG/5ML PO SUSR: 400.0000 mg | Freq: Two times a day (BID) | ORAL | Status: AC

## 2011-11-29 MED ORDER — FLUCONAZOLE 40 MG/ML PO SUSR: 480.0000 mg | Freq: Every day | ORAL | Status: AC

## 2011-11-29 MED ORDER — ACYCLOVIR 200 MG/5ML PO SUSP: 200.0000 mg | Freq: Two times a day (BID) | ORAL | Status: AC

## 2011-11-29 NOTE — Telephone Encounter (Signed)
lmonvm adviisng the pt of his lab appt on 12/06/2011@9 :30am

## 2011-11-29 NOTE — Progress Notes (Signed)
ID: Steven Robinson   DOB: 01/13/1951  MR#: [id number]  WUJ#:[id number]  HISTORY OF PRESENT ILLNESS: HPI: The patient noted an enlarging left neck mass early March 2013, and brought this to the attention of his PCP, Dr. Dewain Penning. She obtained a neck CT at [facility] 08/20/2011, which showed multiple enlarged nodes in the left mid-neck along the jugular v., the largest 2.0 cm. She referred the patient to Suzanna Obey in ENT and FNA of one of the enlarged nodes was positive for squamous cell carcinoma ([id number]). A p16 stain was read as equivocal. PET scan 09/03/2011 showed SUVs >6 in the left oropharynx and left jugular chain nodes; SUV of 5.49 was noted in the larynx. There was no evidence of contralateral or disseminated disease.  This was consistent with a clinical T1 N2,stage IVA left oropharyngeal SCCA. However multiple biopsies under Dr. Jearld Fenton 09/09/2011 [[id number]] were all negative. The pathologic staging therefore is TX N2, which impacts radiation planning.  The patient met with Drs. Cruzita Lederer, and IMRT with concurrent chemosensitization was planned. The patient had dental evaluation under Dr. Kristin Bruins, several extractions under Dr. Chales Salmon, had audiometry (showing mild high-frequency hearing loss) and met with nutrition. A PEG tube placed by IR unfortunately became dislodged and infected, and had to be replaced through an open procedure under Violeta Gelinas. This delayed the start of chemotherapy and caused a change in plan from the original CDDP at 100 mg/m2 Q3w to 40 mg/M2 weekly. Radiation was started 10/04/2011 and chemotherapy 10/25/2011. The patient's subsequent history is as detailed below.   INTERVAL HISTORY: Steven Robinson is seen today in the infusion room while receiving supportive IV fluids. He is accompanied by his wife, Steven Robinson. He contacted the office this morning with complaints of nausea and emesis. He has had no fevers. He continues to have regular bowel movements on daily basis. He's had no  abdominal pain.   REVIEW OF SYSTEMS: Steven Robinson began having significant nausea for the first time over the last week. He has had 4 episodes of vomiting over the last several days. He continues to try to drink water, but continues to have difficulty due to pain with swallowing. He takes Dilaudid appropriately for the pain. He also uses salt water rinses, baking soda rinses, and Magic mouthwash appropriately.  He denies any signs of abnormal bleeding whatsoever. He has some slight skin changes around the neck secondary to radiation therapy, but no skin breakdown.  No abnormal headaches or dizziness. He feels a little lightheaded sometimes when he first stands up. He has a cough, occasionally productive of clear phlegm. No hemoptysis. No chest pain or shortness of breath. No peripheral swelling, and no unusual myalgias, arthralgias, or bony pain.   A detailed review of systems is otherwise noncontributory.   PAST MEDICAL HISTORY: Past Medical History  Diagnosis Date  . Hyperlipoproteinemia   . Obstructive sleep apnea     on C-pap.   [name] GERD (gastroesophageal reflux disease)     chronic  . Hx of colonic polyps   . 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 Vertigo     2/2 intersitial neuritis.   [name] Hearing loss of left ear   . BPH (benign prostatic hyperplasia)   . 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 Hyperlipidemia   . Mucositis 11/01/2011    PAST SURGICAL HISTORY: Past Surgical History  Procedure Date  . Hernia repair 1990    Left  . Fine needle aspiration 08/26/11     LEFT NECK  FNA, LYMPH NODE, METASTATIC SQUAMOUS CELL CARCINOMA  . Gastrostomy 09/24/2011    Procedure: GASTROSTOMY;  Surgeon: Liz Malady, MD;  Location: [facility] OR;  Service: General;  Laterality: N/A;  open G-tube placement procedure start @1219   . Rigid esophagoscopy 09/24/2011    Procedure: RIGID ESOPHAGOSCOPY;  Surgeon: Suzanna Obey, MD;  Location: [facility] OR;  Service: ENT;  Laterality: N/A;  Esophagoscopy procedure end @1210      FAMILY HISTORY Family History  Problem Relation Age of Onset  . Alzheimer's disease Mother   . Tics Mother   . Hypertension Father   . Cancer Father 19    Prostate  . Stroke Paternal Grandmother   . Cancer Paternal Grandfather     Lung  and Prostate   the patient's father is alive at age 96. He lives in a retirement community. The patient's mother died at the age of 73 in 2009-11-15; she had significant Alzheimer's disease. The patient is an only child.  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 His wife Steven Robinson is a Music therapist in oil. The live on 8 acres and have 2 large horses, a donkey, a Engineer, manufacturing, chickens, etc. The patient's daughter Steven Robinson lives in [place] and is currently looking for a job. The patient's son Steven Robinson of work for an Economist. The patient has 2 grandchildren I., granddaughter age 14 and a grandson aged 8 months. He attends the [employer].  ADVANCED DIRECTIVES: in place  HEALTH MAINTENANCE: History  Substance Use Topics  . Smoking status: Former Smoker -- 0.2 packs/day for 3 years    Quit date: 06/14/1972  . Smokeless tobacco: Not on file  . Alcohol Use: No     Hx of Drinking wine - Stopped 3 years ago     Allergies  Allergen Reactions  . Cyclobenzaprine Hcl Other (See Comments)    confusion    Current Outpatient Prescriptions  Medication Sig Dispense Refill  . ALPRAZolam (XANAX) 0.5 MG tablet prn      . Alum & Mag Hydroxide-Simeth (MAGIC MOUTHWASH W/LIDOCAINE) SOLN Take 10 mLs by mouth 4 (four) times daily as needed (alternate with carafate, take about 30 min before meals and bedtime).  480 mL  5  . aspirin EC 81 MG tablet Take 81 mg by mouth daily.      [name] dexlansoprazole (DEXILANT) 60 MG capsule Take 60 mg by mouth daily.      . diphenhydrAMINE (BENADRYL) 25 MG tablet Take 25 mg by mouth at bedtime.       . Ensure Plus  (ENSURE PLUS) LIQD Begin one can of Ensure Plus QID with 60 ml free water before and after each bolus feeding.  In addition, either drink or flush tube with 300 ml free water TID.  If tolerated, increase bolus feedings to 1.5 cans QID on Monday, May 13th. If tolerated, increase to goal of 2 cans Ensure Plus QID.  8 Can  0  . fentaNYL (DURAGESIC - DOSED MCG/HR) 25 MCG/HR Place 1 patch (25 mcg total) onto the skin every 3 (three) days.  10 patch  0  . fluconazole (DIFLUCAN) 100 MG tablet       . HYDROcodone-acetaminophen (LORTAB) 7.5-500 MG/15ML solution       . HYDROmorphone (DILAUDID) 4 MG tablet Take 1 tablet (4 mg total) by mouth 2 (two) times daily as needed for pain (1-2 tablets twice a day as needed).  40 tablet  0  . LORazepam (ATIVAN) 0.5 MG tablet Take 0.5 mg by mouth every 6 (six) hours as needed.      [name] LOVAZA 1 G capsule       . ondansetron (ZOFRAN) 8 MG tablet Take by mouth every 12 (twelve) hours as needed. For N/V after 3rd day after chemotherapy      . prochlorperazine (COMPAZINE) 10 MG tablet Take 10 mg by mouth every 6 (six) hours as needed.      . promethazine (PHENERGAN) 25 MG suppository Place 1 suppository (25 mg total) rectally every 6 (six) hours as needed for nausea.  12 each  0  . rosuvastatin (CRESTOR) 40 MG tablet Take 40 mg by mouth daily.      . sucralfate (CARAFATE) 1 GM/10ML suspension Take 10 mLs (1 g total) by mouth 4 (four) times daily.  420 mL  5  . Tamsulosin HCl (FLOMAX) 0.4 MG CAPS Take 0.4 mg by mouth daily.       [name] zolpidem (AMBIEN) 10 MG tablet Take 10 mg by mouth at bedtime.        No current facility-administered medications for this visit.   Facility-Administered Medications Ordered in Other Visits  Medication Dose Route Frequency Provider Last Rate Last Dose  . 0.9 %  sodium chloride infusion   Intravenous Continuous Lowella Dell, MD 1,000 mL/hr at 11/29/11 1410    . LORazepam (ATIVAN) injection 0.5 mg  0.5 mg Intravenous Once Catalina Gravel, PA    0.5 mg at 11/29/11 1415  . metoCLOPramide (REGLAN) injection 10 mg  10 mg Intravenous Once Catalina Gravel, PA   10 mg at 11/29/11 1415    OBJECTIVE: Middle-aged white male who appears remarkably functional under the circumstances VITALS:  Blood pressure 112/70; pulse 92; respirations 18; temperature 98.3; weight 242 pounds, 4.8 ounces ECOG FS: 2 Physical Exam: HEENT:  Sclerae anicteric, conjunctivae pink.  Oropharynx is notable for improving mucositis with only a few ulcerations bilaterally in the buccal mucosa. No evidence of thrush currently. Mild pharyngeal erythema is noted as well. Lungs:  Clear to auscultation bilaterally.  No crackles, rhonchi, or wheezes.   Heart:  Regular rate and rhythm.   Abdomen:  Soft, nontender.  Positive bowel sounds. G-tube is in place. Extremities:  Benign.  No peripheral edema or cyanosis.   Skin:  Benign with the exception of erythema associated with radiation changes around the neck and upper shoulders. No moist desquamation noted.  Neuro:  Nonfocal. Alert and oriented x3.    LAB RESULTS: Lab Results  Component Value Date   WBC 2.1* 11/23/2011   NEUTROABS 1.6 11/23/2011   HGB 11.4* 11/23/2011   HCT 33.6* 11/23/2011   MCV 85.3 11/23/2011   PLT 83* 11/23/2011      Chemistry      Component Value Date/Time   NA 136 11/23/2011 0847   K 4.1 11/23/2011 0847   CL 97 11/23/2011 0847   CO2 28 11/23/2011 0847   BUN 15 11/23/2011 0847   CREATININE 0.78 11/23/2011 0847      Component Value Date/Time   CALCIUM 9.5 11/23/2011 0847   ALKPHOS 62 11/23/2011 0847   AST 21 11/23/2011 0847   ALT 31 11/23/2011 0847   BILITOT 0.6 11/23/2011 0847       STUDIES: No new results found.  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 We will go ahead and check his CBC while he is here, and try to avoid him returning to the office again on Thursday, June 20. He'll have his labs checked again in one week, June 24, then will see Dr. Darnelle Catalan and Dr. Basilio Cairo on July 1 to discuss longterm followup. I have asked him to contact us if he continues having nausea and emesis. He voices his understanding and agreement.   According to Dr. Darnelle Catalan previous notes, we will have Dr. Janee Morn remove the G-tube as soon as Jeffren is able to take solids and liquids without difficulty. We will likely restage with PET scan and CTs of the neck and chest in March of 2014, his 1 year anniversary.   Kriste Broman    11/29/2011

## 2011-11-29 NOTE — Telephone Encounter (Signed)
Patient's wife [name] called to tell me that Mr. Bessey has been having increased nausea and vomiting since his last treatment last week.  He has been taking his nausea medication but has been vomiting daily including water and TF.  He has decreased total volume of TF but still is vomiting.  I instructed him to contact his MD.  I have instructed him to hold TF if he is vomiting.  He is to resume feedings once vomiting resolved by infusing 120 ml of Boost at a time slowly to establish tolerance.  He can slowly increase back to goal rate if tolerated.  I have also left a voce mail for Dr. Darrall Dears nurse to inform them of this conversation.

## 2011-11-29 NOTE — Progress Notes (Signed)
Steven Robinson's CBC showed him to be neutropenic today with a total white cell count of 1.0, ANC 0.7. Fortunately he remains afebrile.  These labs were reviewed with Steven Robinson. We will start Steven Robinson on Augmentin 875/125, twice a day x7 days. She will continue on both acyclovir (400 mg twice daily) and Diflucan (100 mg daily) as well.  I have called the patient to make him aware of these changes. He was unavailable, and I left a message on an identified voice mail. I asked him to return my call at [phone] to confirm the above information. Our desk nurse, Steven Sites, RN, was also made aware of the above plan, and will call in the Augmentin to [facility] in [place].  Steven Robinson is scheduled for repeat labs on 12/06/11 and will see Steven Robinson for follow up on 12/13/11.  Steven Scale, PA-C 11/29/2011

## 2011-11-29 NOTE — Telephone Encounter (Signed)
HAVE NOTIFIED SPOUSE THAT RX FOR ACYCLOVIR, DIFLUCAN, AND AUGUMENTIN. ALL HAVE BEEN CALLED TO [facility] [place] TO BE DISPENSED IN LIQUID FORM. SPOUSE HAS BEEN INSTRUCTED TO CALLED THIS DESK TOMMROW  WITH UPDATE

## 2011-11-30 ENCOUNTER — Other Ambulatory Visit: Payer: Self-pay | Admitting: *Deleted

## 2011-12-02 ENCOUNTER — Other Ambulatory Visit: Payer: Self-pay | Admitting: Lab

## 2011-12-06 ENCOUNTER — Telehealth: Payer: Self-pay | Admitting: Oncology

## 2011-12-06 ENCOUNTER — Ambulatory Visit: Payer: Managed Care, Other (non HMO) | Admitting: Nutrition

## 2011-12-06 ENCOUNTER — Telehealth: Payer: Self-pay | Admitting: *Deleted

## 2011-12-06 ENCOUNTER — Other Ambulatory Visit: Payer: Self-pay | Admitting: *Deleted

## 2011-12-06 ENCOUNTER — Other Ambulatory Visit (HOSPITAL_BASED_OUTPATIENT_CLINIC_OR_DEPARTMENT_OTHER): Payer: Managed Care, Other (non HMO) | Admitting: Lab

## 2011-12-06 ENCOUNTER — Ambulatory Visit (HOSPITAL_BASED_OUTPATIENT_CLINIC_OR_DEPARTMENT_OTHER): Payer: Managed Care, Other (non HMO)

## 2011-12-06 VITALS — BP 115/72 | HR 95 | Temp 97.6°F

## 2011-12-06 DIAGNOSIS — C760 Malignant neoplasm of head, face and neck: Secondary | ICD-10-CM

## 2011-12-06 DIAGNOSIS — R112 Nausea with vomiting, unspecified: Secondary | ICD-10-CM

## 2011-12-06 DIAGNOSIS — E860 Dehydration: Secondary | ICD-10-CM

## 2011-12-06 LAB — CBC WITH DIFFERENTIAL/PLATELET
BASO%: 0.7 % (ref 0.0–2.0)
HCT: 31.9 % — ABNORMAL LOW (ref 38.4–49.9)
HGB: 10.9 g/dL — ABNORMAL LOW (ref 13.0–17.1)
MCHC: 34.3 g/dL (ref 32.0–36.0)
MONO#: 0.2 10*3/uL (ref 0.1–0.9)
NEUT#: 1 10*3/uL — ABNORMAL LOW (ref 1.5–6.5)
NEUT%: 69 % (ref 39.0–75.0)
WBC: 1.4 10*3/uL — ABNORMAL LOW (ref 4.0–10.3)
lymph#: 0.2 10*3/uL — ABNORMAL LOW (ref 0.9–3.3)

## 2011-12-06 LAB — COMPREHENSIVE METABOLIC PANEL
ALT: 19 U/L (ref 0–53)
CO2: 30 mEq/L (ref 19–32)
Calcium: 9.1 mg/dL (ref 8.4–10.5)
Chloride: 99 mEq/L (ref 96–112)
Creatinine, Ser: 0.73 mg/dL (ref 0.50–1.35)
Total Protein: 6.3 g/dL (ref 6.0–8.3)

## 2011-12-06 MED ORDER — SODIUM CHLORIDE 0.9 % IJ SOLN
10.0000 mL | INTRAMUSCULAR | Status: DC | PRN
  Administered 2011-12-06: 10 mL via INTRAVENOUS
  Filled 2011-12-06: qty 10

## 2011-12-06 MED ORDER — SODIUM CHLORIDE 0.9 % IV SOLN
INTRAVENOUS | Status: DC
  Administered 2011-12-06: 11:00:00 via INTRAVENOUS
  Filled 2011-12-06: qty 1000

## 2011-12-06 MED ORDER — HEPARIN SOD (PORK) LOCK FLUSH 100 UNIT/ML IV SOLN
500.0000 [IU] | Freq: Once | INTRAVENOUS | Status: DC
  Filled 2011-12-06: qty 5

## 2011-12-06 MED ORDER — METOCLOPRAMIDE HCL 5 MG/5ML PO SOLN: 10.0000 mg | Freq: Three times a day (TID) | ORAL | Status: DC

## 2011-12-06 MED ORDER — LANSOPRAZOLE 30 MG PO TBDP: 30.0000 mg | ORAL_TABLET | Freq: Every day | ORAL | Status: DC

## 2011-12-06 NOTE — Telephone Encounter (Signed)
Sent Steven Robinson a staff message to add the fluid appts for the pt

## 2011-12-06 NOTE — Telephone Encounter (Signed)
appt requested for iv fluids

## 2011-12-06 NOTE — Telephone Encounter (Signed)
The pt has his June appt calendar for the iv fluids

## 2011-12-06 NOTE — Patient Instructions (Signed)
Dehydration, Adult Dehydration is when you lose more fluids from the body than you take in. Vital organs like the kidneys, brain, and heart cannot function without a proper amount of fluids and salt. Any loss of fluids from the body can cause dehydration.  CAUSES   Vomiting.   Diarrhea.   Excessive sweating.   Excessive urine output.   Fever.  SYMPTOMS  Mild dehydration  Thirst.   Dry lips.   Slightly dry mouth.  Moderate dehydration  Very dry mouth.   Sunken eyes.   Skin does not bounce back quickly when lightly pinched and released.   Dark urine and decreased urine production.   Decreased tear production.   Headache.  Severe dehydration  Very dry mouth.   Extreme thirst.   Rapid, weak pulse (more than 100 beats per minute at rest).   Cold hands and feet.   Not able to sweat in spite of heat and temperature.   Rapid breathing.   Blue lips.   Confusion and lethargy.   Difficulty being awakened.   Minimal urine production.   No tears.  DIAGNOSIS  Your caregiver will diagnose dehydration based on your symptoms and your exam. Blood and urine tests will help confirm the diagnosis. The diagnostic evaluation should also identify the cause of dehydration. TREATMENT  Treatment of mild or moderate dehydration can often be done at home by increasing the amount of fluids that you drink. It is best to drink small amounts of fluid more often. Drinking too much at one time can make vomiting worse. Refer to the home care instructions below. Severe dehydration needs to be treated at the hospital where you will probably be given intravenous (IV) fluids that contain water and electrolytes. HOME CARE INSTRUCTIONS   Ask your caregiver about specific rehydration instructions.   Drink enough fluids to keep your urine clear or pale yellow.   Drink small amounts frequently if you have nausea and vomiting.   Eat as you normally do.   Avoid:   Foods or drinks high in  sugar.   Carbonated drinks.   Juice.   Extremely hot or cold fluids.   Drinks with caffeine.   Fatty, greasy foods.   Alcohol.   Tobacco.   Overeating.   Gelatin desserts.   Wash your hands well to avoid spreading bacteria and viruses.   Only take over-the-counter or prescription medicines for pain, discomfort, or fever as directed by your caregiver.   Ask your caregiver if you should continue all prescribed and over-the-counter medicines.   Keep all follow-up appointments with your caregiver.  SEEK MEDICAL CARE IF:  You have abdominal pain and it increases or stays in one area (localizes).   You have a rash, stiff neck, or severe headache.   You are irritable, sleepy, or difficult to awaken.   You are weak, dizzy, or extremely thirsty.  SEEK IMMEDIATE MEDICAL CARE IF:   You are unable to keep fluids down or you get worse despite treatment.   You have frequent episodes of vomiting or diarrhea.   You have blood or green matter (bile) in your vomit.   You have blood in your stool or your stool looks black and tarry.   You have not urinated in 6 to 8 hours, or you have only urinated a small amount of very dark urine.   You have a fever.   You faint.  MAKE SURE YOU:   Understand these instructions.   Will watch your condition.     Will get help right away if you are not doing well or get worse.  Document Released: 05/31/2005 Document Revised: 05/20/2011 Document Reviewed: 01/18/2011 Exit[employer] Patient Information 2012 [employer]. 

## 2011-12-06 NOTE — Progress Notes (Signed)
I spoke with Mr. Steven Robinson in the chemotherapy area today while he was receiving fluids.  He reports a continuing struggle with nausea and vomiting and states he vomited 7-8 times over the last week.  He was "out of it" per his wife this weekend.  He is only tolerating 6 cans of Boost Plus instead 8 cans a day.  He is infusing 1 can every 2 hours as tolerated.  This morning was the 1st morning he was able to get up and drink water.  Unfortunately, after he drank water and then administered his bolus feeding, he started coughing and vomited up his feeding.  NUTRITION DIAGNOSIS:  Inadequate oral intake continues.  INTERVENTION:  I have provided education and support to Mr. Messman to continue Boost Plus as tolerated 1 can q.2 hours for now with increased fluids as tolerated.  He is to continue medication as prescribed by his physician.  He will gradually work to increase his oral intake as tolerated.  MONITORING/EVALUATION/GOALS:  The patient has been unable to tolerate bolus feedings at goal rate.  However, he is tolerating 6 cans of Boost Plus a day.  He will continue to increase bolus feedings as tolerated.   NEXT VISIT:  There is no official followup scheduled.  Mr. Graybeal has my contact information and will contact me with questions or concerns.    ______________________________ Zenovia Jarred, RD, LDN Clinical Nutrition Specialist BN/MEDQ  D:  12/06/2011  T:  12/06/2011  Job:  [phone]

## 2011-12-07 ENCOUNTER — Other Ambulatory Visit: Payer: Self-pay | Admitting: *Deleted

## 2011-12-07 ENCOUNTER — Ambulatory Visit (HOSPITAL_BASED_OUTPATIENT_CLINIC_OR_DEPARTMENT_OTHER): Payer: Managed Care, Other (non HMO)

## 2011-12-07 DIAGNOSIS — R112 Nausea with vomiting, unspecified: Secondary | ICD-10-CM

## 2011-12-07 DIAGNOSIS — C760 Malignant neoplasm of head, face and neck: Secondary | ICD-10-CM

## 2011-12-07 MED ORDER — SODIUM CHLORIDE 0.9 % IV SOLN
INTRAVENOUS | Status: DC
  Administered 2011-12-07: 15:00:00 via INTRAVENOUS
  Filled 2011-12-07: qty 1000

## 2011-12-07 NOTE — Progress Notes (Signed)
  Radiation Oncology         [phone] ________________________________  Name: Daekwon Beswick MRN: [id number]  Date: 11/22/2011  DOB: May 19, 1951  End of Treatment Note  Diagnosis:   Head and Neck Cancer of Unknown Primary [id number]  Indication for treatment:  Curative     Radiation treatment dates:  10-04-11 to 11-22-11  Site/dose:  Mucosal axis/neck  / 7000cGy in 35 fractions  Beams/energy:  Helical IMRT / photons  Narrative: The patient tolerated radiation treatment relatively well. His side effects including mucositis, skin desquamation, weight loss, and oral thrush.   He received concurrent Cisplatin.  The chemotherapy was started later in his course due to a healing infection from his PEG tube placement.  He is scheduled for his last cycle of Cisplatin tomorrow.  Plan: The patient has completed radiation treatment. He is scheduled for his last cycle of Cisplatin tomorrow. The patient will return to radiation oncology clinic for routine followup in 2-3 weeks. I advised them to call or return sooner if they have any questions or concerns related to their recovery or treatment.  -----------------------------------  Lonie Peak, MD .

## 2011-12-08 ENCOUNTER — Other Ambulatory Visit: Payer: Self-pay | Admitting: Oncology

## 2011-12-08 ENCOUNTER — Ambulatory Visit: Payer: Self-pay

## 2011-12-09 ENCOUNTER — Ambulatory Visit: Payer: Self-pay

## 2011-12-10 ENCOUNTER — Ambulatory Visit (HOSPITAL_BASED_OUTPATIENT_CLINIC_OR_DEPARTMENT_OTHER): Payer: Managed Care, Other (non HMO)

## 2011-12-10 ENCOUNTER — Other Ambulatory Visit: Payer: Self-pay | Admitting: Oncology

## 2011-12-10 DIAGNOSIS — E860 Dehydration: Secondary | ICD-10-CM

## 2011-12-10 MED ORDER — POTASSIUM CHLORIDE 2 MEQ/ML IV SOLN
INTRAVENOUS | Status: DC
  Administered 2011-12-10: 14:00:00 via INTRAVENOUS
  Filled 2011-12-10: qty 1000

## 2011-12-10 NOTE — Patient Instructions (Signed)
Dehydration, Adult Dehydration is when you lose more fluids from the body than you take in. Vital organs like the kidneys, brain, and heart cannot function without a proper amount of fluids and salt. Any loss of fluids from the body can cause dehydration.  CAUSES   Vomiting.   Diarrhea.   Excessive sweating.   Excessive urine output.   Fever.  SYMPTOMS  Mild dehydration  Thirst.   Dry lips.   Slightly dry mouth.  Moderate dehydration  Very dry mouth.   Sunken eyes.   Skin does not bounce back quickly when lightly pinched and released.   Dark urine and decreased urine production.   Decreased tear production.   Headache.  Severe dehydration  Very dry mouth.   Extreme thirst.   Rapid, weak pulse (more than 100 beats per minute at rest).   Cold hands and feet.   Not able to sweat in spite of heat and temperature.   Rapid breathing.   Blue lips.   Confusion and lethargy.   Difficulty being awakened.   Minimal urine production.   No tears.  DIAGNOSIS  Your caregiver will diagnose dehydration based on your symptoms and your exam. Blood and urine tests will help confirm the diagnosis. The diagnostic evaluation should also identify the cause of dehydration. TREATMENT  Treatment of mild or moderate dehydration can often be done at home by increasing the amount of fluids that you drink. It is best to drink small amounts of fluid more often. Drinking too much at one time can make vomiting worse. Refer to the home care instructions below. Severe dehydration needs to be treated at the hospital where you will probably be given intravenous (IV) fluids that contain water and electrolytes. HOME CARE INSTRUCTIONS   Ask your caregiver about specific rehydration instructions.   Drink enough fluids to keep your urine clear or pale yellow.   Drink small amounts frequently if you have nausea and vomiting.   Eat as you normally do.   Avoid:   Foods or drinks high in  sugar.   Carbonated drinks.   Juice.   Extremely hot or cold fluids.   Drinks with caffeine.   Fatty, greasy foods.   Alcohol.   Tobacco.   Overeating.   Gelatin desserts.   Wash your hands well to avoid spreading bacteria and viruses.   Only take over-the-counter or prescription medicines for pain, discomfort, or fever as directed by your caregiver.   Ask your caregiver if you should continue all prescribed and over-the-counter medicines.   Keep all follow-up appointments with your caregiver.  SEEK MEDICAL CARE IF:  You have abdominal pain and it increases or stays in one area (localizes).   You have a rash, stiff neck, or severe headache.   You are irritable, sleepy, or difficult to awaken.   You are weak, dizzy, or extremely thirsty.  SEEK IMMEDIATE MEDICAL CARE IF:   You are unable to keep fluids down or you get worse despite treatment.   You have frequent episodes of vomiting or diarrhea.   You have blood or green matter (bile) in your vomit.   You have blood in your stool or your stool looks black and tarry.   You have not urinated in 6 to 8 hours, or you have only urinated a small amount of very dark urine.   You have a fever.   You faint.  MAKE SURE YOU:   Understand these instructions.   Will watch your condition.     Will get help right away if you are not doing well or get worse.  Document Released: 05/31/2005 Document Revised: 05/20/2011 Document Reviewed: 01/18/2011 Exit[employer] Patient Information 2012 [employer]. 

## 2011-12-13 ENCOUNTER — Telehealth: Payer: Self-pay | Admitting: Oncology

## 2011-12-13 ENCOUNTER — Other Ambulatory Visit: Payer: Managed Care, Other (non HMO) | Admitting: Lab

## 2011-12-13 ENCOUNTER — Ambulatory Visit (HOSPITAL_BASED_OUTPATIENT_CLINIC_OR_DEPARTMENT_OTHER): Payer: Managed Care, Other (non HMO) | Admitting: Oncology

## 2011-12-13 ENCOUNTER — Ambulatory Visit
Admission: RE | Admit: 2011-12-13 | Discharge: 2011-12-13 | Disposition: A | Discharge status: Home / Self Care | Payer: Managed Care, Other (non HMO) | Source: Ambulatory Visit | Attending: Radiation Oncology | Admitting: Radiation Oncology

## 2011-12-13 VITALS — BP 116/75 | HR 105 | Temp 97.7°F | Wt 237.1 lb

## 2011-12-13 VITALS — BP 113/83 | HR 83 | Temp 97.8°F | Ht 73.0 in | Wt 237.3 lb

## 2011-12-13 DIAGNOSIS — C109 Malignant neoplasm of oropharynx, unspecified: Secondary | ICD-10-CM

## 2011-12-13 DIAGNOSIS — C770 Secondary and unspecified malignant neoplasm of lymph nodes of head, face and neck: Secondary | ICD-10-CM

## 2011-12-13 DIAGNOSIS — C801 Malignant (primary) neoplasm, unspecified: Secondary | ICD-10-CM

## 2011-12-13 DIAGNOSIS — C760 Malignant neoplasm of head, face and neck: Secondary | ICD-10-CM

## 2011-12-13 LAB — CBC WITH DIFFERENTIAL/PLATELET
Basophils Absolute: 0 10*3/uL (ref 0.0–0.1)
EOS%: 0.9 % (ref 0.0–7.0)
HGB: 10.9 g/dL — ABNORMAL LOW (ref 13.0–17.1)
MCH: 31.4 pg (ref 27.2–33.4)
MONO#: 0.7 10*3/uL (ref 0.1–0.9)
NEUT#: 4.4 10*3/uL (ref 1.5–6.5)
RDW: 20.2 % — ABNORMAL HIGH (ref 11.0–14.6)
WBC: 5.5 10*3/uL (ref 4.0–10.3)
lymph#: 0.3 10*3/uL — ABNORMAL LOW (ref 0.9–3.3)

## 2011-12-13 LAB — COMPREHENSIVE METABOLIC PANEL
ALT: 13 U/L (ref 0–53)
AST: 18 U/L (ref 0–37)
Albumin: 3.5 g/dL (ref 3.5–5.2)
BUN: 13 mg/dL (ref 6–23)
Calcium: 9.7 mg/dL (ref 8.4–10.5)
Chloride: 97 mEq/L (ref 96–112)
Potassium: 4.1 mEq/L (ref 3.5–5.3)

## 2011-12-13 MED ORDER — HYDROCODONE-ACETAMINOPHEN 7.5-500 MG/15ML PO SOLN: 30.0000 mL | ORAL | Status: DC | PRN

## 2011-12-13 NOTE — Telephone Encounter (Signed)
gve the pt his July,aug 2013 appt calendar along with the appt to see the nutritionist

## 2011-12-13 NOTE — Progress Notes (Signed)
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  He struggled after his last cycle of CDDP with nausea and falling blood counts. Recently he has noted improvement in his swallowing and mouth pain. His saliva is no longer thick but foamy now.  Continues on enteral feeds with PEG tube. Received IV fluids 3 times last week but he does report continued dizziness. He just saw Dr. Darnelle Catalan this AM who stopped his Flomax since it can cause hypotension.  Overall, is starting to feel better, feels like he is healing.  Able to eat soft foods but they have no flavor.  Not using pain meds any more.                       ALLERGIES:  is allergic to cyclobenzaprine hcl.  Meds: Current Outpatient Prescriptions  Medication Sig Dispense Refill  . acyclovir (ZOVIRAX) 200 MG/5ML suspension       . Alum & Mag Hydroxide-Simeth (MAGIC MOUTHWASH W/LIDOCAINE) SOLN Take 10 mLs by mouth 4 (four) times daily as needed (alternate with carafate, take about 30 min before meals and bedtime).  480 mL  5  . dexlansoprazole (DEXILANT) 60 MG capsule Take 60 mg by mouth daily.      . diphenhydrAMINE (BENADRYL) 25 MG tablet Take 25 mg by mouth at bedtime.       . Ensure Plus (ENSURE PLUS) LIQD Begin one can of Ensure Plus QID with 60 ml free water before and after each bolus feeding.  In addition, either drink or flush tube with 300 ml free water TID.  If tolerated, increase bolus feedings to 1.5 cans QID on Monday, May 13th. If tolerated, increase to goal of 2 cans Ensure Plus QID.  8 Can  0  . fluconazole (DIFLUCAN) 100 MG tablet       . zolpidem (AMBIEN) 10 MG  tablet Take 10 mg by mouth at bedtime.       . ALPRAZolam (XANAX) 0.5 MG tablet prn      . aspirin EC 81 MG tablet Take 81 mg by mouth daily.      . fentaNYL (DURAGESIC - DOSED MCG/HR) 12 MCG/HR       . HYDROcodone-acetaminophen (LORTAB) 7.5-500 MG/15ML solution Take 30 mLs by mouth every 4 (four) hours as needed for pain.  120 mL  0  . HYDROmorphone (DILAUDID) 4 MG tablet Take 1 tablet (4 mg total) by mouth 2 (two) times daily as needed for pain (1-2 tablets twice a day as needed).  40 tablet  0  . LORazepam (ATIVAN) 0.5 MG tablet Take 0.5 mg by mouth every 6 (six) hours as needed.      [name] LOVAZA 1 G capsule       . ondansetron (ZOFRAN) 8 MG tablet Take by mouth every 12 (twelve) hours as needed. For N/V after 3rd day after chemotherapy      . prochlorperazine (COMPAZINE) 10 MG tablet Take 10 mg by mouth every 6 (six) hours as needed.      . promethazine (PHENERGAN) 25 MG suppository Place 1 suppository (25 mg total) rectally every  6 (six) hours as needed for nausea.  12 each  0  . rosuvastatin (CRESTOR) 40 MG tablet Take 40 mg by mouth daily.      . sucralfate (CARAFATE) 1 GM/10ML suspension Take 10 mLs (1 g total) by mouth 4 (four) times daily.  420 mL  5  . Tamsulosin HCl (FLOMAX) 0.4 MG CAPS Take 0.4 mg by mouth daily.        No current facility-administered medications for this encounter.   Facility-Administered Medications Ordered in Other Encounters  Medication Dose Route Frequency Provider Last Rate Last Dose  . sodium chloride 0.9 % 1,000 mL with potassium chloride 20 mEq infusion   Intravenous Continuous Lowella Dell, MD      . sodium chloride 0.9 % 1,000 mL with potassium chloride 20 mEq infusion   Intravenous Continuous Lowella Dell, MD      . DISCONTD: dextrose 5 % and 0.45% NaCl 1,000 mL with potassium chloride 20 mEq infusion   Intravenous Continuous Lowella Dell, MD        Physical Findings: The patient is in no acute distress. Patient is alert and oriented.   weight is 237 lb 1.6 oz (107.548 kg). His temperature is 97.7 F (36.5 C). His blood pressure is 116/75 and his pulse is 105.  (while standing). BP 114/74 and pulse 74 sitting.   Oropharynx shows resolving mucositis. No thrush. Mucous membranes somewhat moist.  Neck negative for palpable lymphadenopathy. Skin has healed well. Alopecia at neck/scalp line.    Lab Findings: Lab Results  Component Value Date   WBC 5.5 12/13/2011   HGB 10.9* 12/13/2011   HCT 31.7* 12/13/2011   MCV 90.8 12/13/2011   PLT 232 12/13/2011   CMP     Component Value Date/Time   NA 134* 12/13/2011 0950   K 4.1 12/13/2011 0950   CL 97 12/13/2011 0950   CO2 28 12/13/2011 0950   GLUCOSE 100* 12/13/2011 0950   BUN 13 12/13/2011 0950   CREATININE 0.81 12/13/2011 0950   CALCIUM 9.7 12/13/2011 0950   PROT 6.8 12/13/2011 0950   ALBUMIN 3.5 12/13/2011 0950   AST 18 12/13/2011 0950   ALT 13 12/13/2011 0950   ALKPHOS 62 12/13/2011 0950   BILITOT 0.4 12/13/2011 0950   GFRNONAA >90 09/24/2011 0450   GFRAA >90 09/24/2011 0450      Radiographic Findings: No results found.  Impression:  The patient is recovering from the effects of radiation.    Plan: followup in  5 weeks. Counseled on PO intake, swallowing exercises, artificial saliva/biotene for dry mouth.  Encouraged to call if issues before follow-up. -----------------------------------  Lonie Peak, MD

## 2011-12-13 NOTE — Progress Notes (Addendum)
In past 3 days swallowing better and mouth feels better.  One area in the roof of his mouth is still "bothersome.  Voice is raspy and he notes post-nasal dritates saliva no longer ropy but foamy now.  New presentation of dry cough in past 3 days.   Continues on enteral feeds via PEG tube.  Received hydration fluids 3 times last week but he does report continued dizziness.  Dr. Gaylyn Rong stopped his Flomax since he can cause hypotension.  Vital signs stable.  States he is not sleeping well because of continued dry mouth.  Last week new presentation of swelling in the bilateral lower neck region which has decreased somewhat since last week.  Denies any pain in this area

## 2011-12-13 NOTE — Progress Notes (Signed)
ID: Steven Robinson   DOB: 21-Aug-1950  MR#: [id number]  CSN#:[id number]  HISTORY OF PRESENT ILLNESS: HPI: The patient noted an enlarging left neck mass early March 2013, and brought this to the attention of his PCP, Dr. Dewain Penning. She obtained a neck CT at [facility] 08/20/2011, which showed multiple enlarged nodes in the left mid-neck along the jugular v., the largest 2.0 cm. She referred the patient to Steven Robinson in ENT and FNA of one of the enlarged nodes was positive for squamous cell carcinoma ([id number]). A p16 stain was read as equivocal. PET scan 09/03/2011 showed SUVs >6 in the left oropharynx and left jugular chain nodes; SUV of 5.49 was noted in the larynx. There was no evidence of contralateral or disseminated disease.  This was consistent with a clinical T1 N2,stage IVA left oropharyngeal SCCA. However multiple biopsies under Dr. Jearld Fenton 09/09/2011 [[id number]] were all negative. The pathologic staging therefore is TX N2, which impacts radiation planning.  The patient met with Drs. Steven Robinson, and IMRT with concurrent chemosensitization was planned. The patient had dental evaluation under Dr. Kristin Robinson, several extractions under Dr. Chales Robinson, had audiometry (showing mild high-frequency hearing loss) and met with nutrition. A PEG tube placed by IR unfortunately became dislodged and infected, and had to be replaced through an open procedure under Steven Robinson. This delayed the start of chemotherapy and caused a change in plan from the original CDDP at 100 mg/m2 Q3w to 40 mg/M2 weekly. Radiation was started 10/04/2011 and chemotherapy 10/25/2011. The patient's subsequent history is as detailed below.   INTERVAL HISTORY: Steven Robinson returns today with his wife Steven Robinson for followup of his squamous cell carcinoma of the oropharynx. He completed his treatments July 11. He had significant nausea for about 10 days following his last chemotherapy, and of course he had considerable pain is well. The pain has been improving  faster than I expected. He actually stopped all his pain medicines on Friday, June 28. That includes his fentanyl patches. He required some IV fluids last week, but now is able to drink water well enough that he is keeping his urine "on the clear side".  REVIEW OF SYSTEMS:  He describes his pain currently as moderate. This is with no medications. He takes MiraLAX every night and has daily soft bowel movements. Currently he has no nausea or vomiting problems, but he does feel dizzy. This has been present for about 2-3 weeks. 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 He is doing at his feeds one can of every 2 hours instead of 2 cans every 4. He is getting 6-7 cans a day. He is looking forward to a time when he can transition to oral by mouth, although of course were not there yet. He is having no fevers, no cough or phlegm production, no chest pain or pressure, and the rest of the detailed review of systems was noncontributory. He is wanting to start a gentle exercise program  PAST MEDICAL HISTORY: Past Medical History  Diagnosis Date  . Hyperlipoproteinemia   . Obstructive sleep apnea     on C-pap.   [name] GERD (gastroesophageal reflux disease)     chronic  . Hx of colonic polyps   . 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 Vertigo     2/2 intersitial neuritis.   [name] Hearing loss of left ear   . BPH (benign prostatic hyperplasia)   .  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 Hyperlipidemia   . Mucositis 11/01/2011    PAST SURGICAL HISTORY: Past Surgical History  Procedure Date  . Hernia repair 1990    Left  . Fine needle aspiration 08/26/11     LEFT NECK FNA, LYMPH NODE, METASTATIC SQUAMOUS CELL CARCINOMA  . Gastrostomy 09/24/2011    Procedure: GASTROSTOMY;  Surgeon: Steven Malady, MD;  Location: [facility] OR;  Service: General;  Laterality: N/A;  open G-tube placement procedure start @1219   . Rigid esophagoscopy  09/24/2011    Procedure: RIGID ESOPHAGOSCOPY;  Surgeon: Steven Obey, MD;  Location: [facility] OR;  Service: ENT;  Laterality: N/A;  Esophagoscopy procedure end @1210     FAMILY HISTORY Family History  Problem Relation Age of Onset  . Alzheimer's disease Mother   . Tics Mother   . Hypertension Father   . Cancer Father 25    Prostate  . Stroke Paternal Grandmother   . Cancer Paternal Grandfather     Lung  and Prostate   the patient's father is alive at age 65. He lives in a retirement community. The patient's mother died at the age of 89 in 2009/12/07; she had significant Alzheimer's disease. The patient is an only child.  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 His wife Steven Robinson is a Music therapist in oil. The live on 8 acres and have 2 large horses, a donkey, a Engineer, manufacturing, chickens, etc. The patient's daughter Steven Robinson lives in [place] and is currently looking for a job. The patient's son Steven Robinson of work for an Economist. The patient has 2 grandchildren I., granddaughter age 69 and a grandson aged 8 months. He attends the [employer].  ADVANCED DIRECTIVES: in place  HEALTH MAINTENANCE: History  Substance Use Topics  . Smoking status: Former Smoker -- 0.2 packs/day for 3 years    Quit date: 06/14/1972  . Smokeless tobacco: Not on file  . Alcohol Use: No     Hx of Drinking wine - Stopped 3 years ago     Allergies  Allergen Reactions  . Cyclobenzaprine Hcl Other (See Comments)    confusion    Current Outpatient Prescriptions  Medication Sig Dispense Refill  . dexlansoprazole (DEXILANT) 60 MG capsule Take 60 mg by mouth daily.      . diphenhydrAMINE (BENADRYL) 25 MG tablet Take 25 mg by mouth at bedtime.       . Ensure Plus (ENSURE PLUS) LIQD Begin one can of Ensure Plus QID with 60 ml free water before and after each bolus feeding.  In addition, either drink or flush  tube with 300 ml free water TID.  If tolerated, increase bolus feedings to 1.5 cans QID on Monday, May 13th. If tolerated, increase to goal of 2 cans Ensure Plus QID.  8 Can  0  . fluconazole (DIFLUCAN) 100 MG tablet       . lansoprazole (PREVACID SOLUTAB) 30 MG disintegrating tablet Take 1 tablet (30 mg total) by mouth daily.  30 tablet  2  . Tamsulosin HCl (FLOMAX) 0.4 MG CAPS Take 0.4 mg by mouth daily.       [name] zolpidem (AMBIEN) 10 MG tablet Take 10 mg by mouth at bedtime.       . ALPRAZolam (XANAX) 0.5 MG tablet prn      . Alum & Mag Hydroxide-Simeth (MAGIC MOUTHWASH W/LIDOCAINE) SOLN Take 10 mLs by mouth 4 (four) times daily as needed (  alternate with carafate, take about 30 min before meals and bedtime).  480 mL  5  . aspirin EC 81 MG tablet Take 81 mg by mouth daily.      [name] HYDROcodone-acetaminophen (LORTAB) 7.5-500 MG/15ML solution       . HYDROmorphone (DILAUDID) 4 MG tablet Take 1 tablet (4 mg total) by mouth 2 (two) times daily as needed for pain (1-2 tablets twice a day as needed).  40 tablet  0  . LORazepam (ATIVAN) 0.5 MG tablet Take 0.5 mg by mouth every 6 (six) hours as needed.      [name] LOVAZA 1 G capsule       . metoCLOPramide (REGLAN) 5 MG/5ML solution Take 10 mLs (10 mg total) by mouth 4 (four) times daily -  before meals and at bedtime.  1200 mL  2  . ondansetron (ZOFRAN) 8 MG tablet Take by mouth every 12 (twelve) hours as needed. For N/V after 3rd day after chemotherapy      . prochlorperazine (COMPAZINE) 10 MG tablet Take 10 mg by mouth every 6 (six) hours as needed.      . promethazine (PHENERGAN) 25 MG suppository Place 1 suppository (25 mg total) rectally every 6 (six) hours as needed for nausea.  12 each  0  . rosuvastatin (CRESTOR) 40 MG tablet Take 40 mg by mouth daily.      . sucralfate (CARAFATE) 1 GM/10ML suspension Take 10 mLs (1 g total) by mouth 4 (four) times daily.  420 mL  5   No current facility-administered medications for this visit.   Facility-Administered  Medications Ordered in Other Visits  Medication Dose Route Frequency Provider Last Rate Last Dose  . sodium chloride 0.9 % 1,000 mL with potassium chloride 20 mEq infusion   Intravenous Continuous Lowella Dell, MD      . sodium chloride 0.9 % 1,000 mL with potassium chloride 20 mEq infusion   Intravenous Continuous Lowella Dell, MD      . DISCONTD: dextrose 5 % and 0.45% NaCl 1,000 mL with potassium chloride 20 mEq infusion   Intravenous Continuous Lowella Dell, MD        OBJECTIVE: Middle-aged white male who appears moderately comfortable Filed Vitals:   12/13/11 1006  BP: 113/83  Pulse: 83  Temp: 97.8 F (36.6 C)     Body mass index is 31.31 kg/(m^2).    ECOG FS: 2 Filed Weights   12/13/11 1006  Weight: 237 lb 4.8 oz (107.639 kg)   weight is down 5 pounds as compared to June 10  Sclerae unicteric Oropharynx shows nearly resolved mucositis. There is no thrush. No palpable cervical adenopathy; both supraclavicular fossae are slightly full, left slightly greater than right, but no suspicious masses are palpated Lungs no rales or rhonchi Heart regular rate and rhythm, no murmur appreciated Abd soft, nontender, positive bowel sounds; G-tube in place, .MSK no focal spinal tenderness, no peripheral edema Neuro: nonfocal   LAB RESULTS: Lab Results  Component Value Date   WBC 5.5 12/13/2011   NEUTROABS 4.4 12/13/2011   HGB 10.9* 12/13/2011   HCT 31.7* 12/13/2011   MCV 90.8 12/13/2011   PLT 232 12/13/2011      Chemistry      Component Value Date/Time   NA 138 12/06/2011 1005   K 4.0 12/06/2011 1005   CL 99 12/06/2011 1005   CO2 30 12/06/2011 1005   BUN 21 12/06/2011 1005   CREATININE 0.73 12/06/2011 1005  Component Value Date/Time   CALCIUM 9.1 12/06/2011 1005   ALKPHOS 59 12/06/2011 1005   AST 17 12/06/2011 1005   ALT 19 12/06/2011 1005   BILITOT 0.5 12/06/2011 1005      No results found for this basename: INR:1;PROTIME:1 in the last 168 hours  Urinalysis No results  found for this basename: colorurine,  appearanceur,  labspec,  phurine,  glucoseu,  hgbur,  bilirubinur,  ketonesur,  proteinur,  urobilinogen,  nitrite,  leukocytesur    STUDIES: No new results found.  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 It seems remarkable to me that he could go off his pain medications at 1 below. I did refill his liquid hydrocodone/ APAP today, especially as we are coming up on a holiday and I would not wantthem to run out. 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 He will call me after a couple of days to let me know if that improves things; otherwise he will meet with our nutritionist, and continue to see me on an every 2 week basis until he has fully transitioned to an oral diet and can have his PEG tube removed. He knows to call for any problems that may develop before that  Randa Riss C    12/13/2011

## 2011-12-20 ENCOUNTER — Telehealth: Payer: Self-pay | Admitting: *Deleted

## 2011-12-20 ENCOUNTER — Other Ambulatory Visit: Payer: Self-pay | Admitting: *Deleted

## 2011-12-20 MED ORDER — DIAZEPAM 5 MG PO TABS: 5.0000 mg | ORAL_TABLET | Freq: Two times a day (BID) | ORAL | Status: AC

## 2011-12-20 NOTE — Telephone Encounter (Signed)
Per MD review prescription for diazepam called into pharmacy.  Discussed with Mory who verbalized understanding to contact this office on Friday with update.

## 2011-12-20 NOTE — Telephone Encounter (Signed)
Pt called to give MD update on status with dizziness-  " it is no better but not worse ".  Steven Robinson states he has been seen in past ( greater than 3 yrs ) "by Steven Robinson which he diagnosed me as having interstitial neuronitis "  Steven Robinson called to make an appt with Steven Robinson but due to not seen for greater than 68yrs he is considered a " new patient".  Steven Robinson states treatment per Steven Robinson for above diagnosis was use of valium x 3days.  Steven Robinson is wondering if recent therapy could have triggered above and if Steven Darnelle Catalan could treat since he would not be able to be seen by Steven Robinson for a while.  This note will be reviewed with Steven Thea Silversmith.

## 2011-12-21 ENCOUNTER — Ambulatory Visit: Payer: Managed Care, Other (non HMO) | Admitting: Nutrition

## 2011-12-21 NOTE — Progress Notes (Signed)
Mr. Buch and his wife return for nutrition followup.  He has been able to increase Boost Plus to goal rate of 8 cans daily.  He reports his nausea and vomiting have resolved.  He is drinking a lot of water by mouth, somewhere around 5-6 sixteen-ounce bottles a day.  He has begun to incorporate soft moist foods in very small amounts.  He still has difficulty with dry mouth and taste alterations.  His weight was documented as 237.1 pounds on July 1st.  NUTRITION DIAGNOSIS:  Inadequate oral intake continues.  INTERVENTION:  I have educated Mr. Elms on the importance of increasing oral diet using soft moist smooth foods and gradually progressing to finely chopped moist meats, salads, and other foods as he progresses to a regular diet.  He was encouraged to drink Boost Plus by mouth if tolerated.  I have educated him on strategies for slowly decreasing tube feedings while increasing oral intake so as to provide weight maintenance and adequate calories and protein for healing.  The patient verbalizes understanding and was appreciative of information.  MONITORING/EVALUATION/GOALS:  The patient has been able to increase bolus feedings back to goal rate and has begun to introduce soft solid foods. He will work to increase oral diet and decrease tube feedings while promoting weight maintenance.  NEXT VISIT:  Wednesday, August 14th prior to doctor's appointment.    ______________________________ Zenovia Jarred, RD, LDN Clinical Nutrition Specialist BN/MEDQ  D:  12/21/2011  T:  12/21/2011  Job:  [phone]

## 2011-12-27 ENCOUNTER — Telehealth: Payer: Self-pay | Admitting: *Deleted

## 2011-12-27 NOTE — Telephone Encounter (Signed)
Message left by pt stating he is continuing to have dizziness despite valium therapy for interstiatal neurititis.  Steven Robinson has also noted " nasal drip " " seems to bother me more at night- congestion and cough ".  Return call number [phone].  This note will be reviewed with MD.

## 2011-12-29 ENCOUNTER — Telehealth: Payer: Self-pay | Admitting: *Deleted

## 2012-01-03 ENCOUNTER — Encounter: Payer: Self-pay | Admitting: Physician Assistant

## 2012-01-03 ENCOUNTER — Other Ambulatory Visit (HOSPITAL_BASED_OUTPATIENT_CLINIC_OR_DEPARTMENT_OTHER): Payer: Managed Care, Other (non HMO)

## 2012-01-03 ENCOUNTER — Ambulatory Visit (HOSPITAL_BASED_OUTPATIENT_CLINIC_OR_DEPARTMENT_OTHER): Payer: Managed Care, Other (non HMO) | Admitting: Physician Assistant

## 2012-01-03 ENCOUNTER — Ambulatory Visit: Payer: Managed Care, Other (non HMO)

## 2012-01-03 ENCOUNTER — Ambulatory Visit: Payer: Managed Care, Other (non HMO) | Attending: Radiation Oncology

## 2012-01-03 VITALS — BP 110/72 | HR 72 | Temp 97.0°F | Ht 73.0 in | Wt 231.9 lb

## 2012-01-03 DIAGNOSIS — C109 Malignant neoplasm of oropharynx, unspecified: Secondary | ICD-10-CM

## 2012-01-03 DIAGNOSIS — C770 Secondary and unspecified malignant neoplasm of lymph nodes of head, face and neck: Secondary | ICD-10-CM

## 2012-01-03 DIAGNOSIS — R1313 Dysphagia, pharyngeal phase: Secondary | ICD-10-CM | POA: Insufficient documentation

## 2012-01-03 DIAGNOSIS — IMO0001 Reserved for inherently not codable concepts without codable children: Secondary | ICD-10-CM | POA: Insufficient documentation

## 2012-01-03 DIAGNOSIS — C801 Malignant (primary) neoplasm, unspecified: Secondary | ICD-10-CM

## 2012-01-03 DIAGNOSIS — C760 Malignant neoplasm of head, face and neck: Secondary | ICD-10-CM

## 2012-01-03 LAB — CBC WITH DIFFERENTIAL/PLATELET
BASO%: 1.2 % (ref 0.0–2.0)
Basophils Absolute: 0 10*3/uL (ref 0.0–0.1)
EOS%: 4.3 % (ref 0.0–7.0)
HGB: 12.8 g/dL — ABNORMAL LOW (ref 13.0–17.1)
MCH: 32.7 pg (ref 27.2–33.4)
MCHC: 34.6 g/dL (ref 32.0–36.0)
RDW: 20.6 % — ABNORMAL HIGH (ref 11.0–14.6)
WBC: 3.9 10*3/uL — ABNORMAL LOW (ref 4.0–10.3)
lymph#: 0.5 10*3/uL — ABNORMAL LOW (ref 0.9–3.3)

## 2012-01-03 LAB — BASIC METABOLIC PANEL
CO2: 27 mEq/L (ref 19–32)
Calcium: 9.5 mg/dL (ref 8.4–10.5)
Creatinine, Ser: 0.81 mg/dL (ref 0.50–1.35)

## 2012-01-03 NOTE — Progress Notes (Signed)
ID: Steven Robinson   DOB: 10-08-1950  MR#: [id number]  MVH#:[id number]  HISTORY OF PRESENT ILLNESS: HPI: The patient noted an enlarging left neck mass early March 2013, and brought this to the attention of his PCP, Dr. Dewain Robinson. She obtained a neck CT at [facility] 08/20/2011, which showed multiple enlarged nodes in the left mid-neck along the jugular v., the largest 2.0 cm. She referred the patient to Steven Robinson in ENT and FNA of one of the enlarged nodes was positive for squamous cell carcinoma ([id number]). A p16 stain was read as equivocal. PET scan 09/03/2011 showed SUVs >6 in the left oropharynx and left jugular chain nodes; SUV of 5.49 was noted in the larynx. There was no evidence of contralateral or disseminated disease.  This was consistent with a clinical T1 N2,stage IVA left oropharyngeal SCCA. However multiple biopsies under Steven Robinson 09/09/2011 [[id number]] were all negative. The pathologic staging therefore is TX N2, which impacts radiation planning.  The patient met with Drs. Steven Robinson, and IMRT with concurrent chemosensitization was planned. The patient had dental evaluation under Dr. Kristin Robinson, several extractions under Dr. Chales Robinson, had audiometry (showing mild high-frequency hearing loss) and met with nutrition. A PEG tube placed by IR unfortunately became dislodged and infected, and had to be replaced through an open procedure under Steven Robinson. This delayed the start of chemotherapy and caused a change in plan from the original CDDP at 100 mg/m2 Q3w to 40 mg/M2 weekly. Radiation was started 10/04/2011 and chemotherapy 10/25/2011. The patient's subsequent history is as detailed below.   INTERVAL HISTORY: Steven Robinson returns today with his wife Steven Robinson for followup of his squamous cell carcinoma of the oropharynx. He completed his treatments June 11. He is recovering quite well. Interval history is remarkable, however, for the development of vertigo. This was originally treated with Valium. He was  then evaluated by his primary care physician, Dr. Dewain Robinson, and was started on Valium, meclizine, and scopolamine. He was also started on Augmentin for some postnasal drip. The symptoms have improved very slightly since beginning this regimen 3 days ago. He has had no falls associated with the dizziness. He's had no associated headaches, and no change in vision.  Interval history is also remarkable for the fact that they discontinued his tube feedings last Wednesday, July 17. He is now been taking his boost orally, approximately 6 cans daily. He is eating very small meals, and has been able to ate small amounts of chicken, eggs, grits, soups and other soft foods without difficulty. He denies any dysphagia and has had no pain with swallowing. No nausea or emesis. No mouth ulcers or oral sensitivity.   REVIEW OF SYSTEMS:  He describes his pain currently as mild. This is with no medications. He continues to have mild constipation for which he utilizes MiraLAX appropriately and affectively. He's had no fevers, chills, or night sweats. No abnormal bleeding. No cough, phlegm production, or increased shortness of breath. No chest pain or palpitations. No unusual pain or peripheral swelling.  A detailed review of systems is otherwise noncontributory.   PAST MEDICAL HISTORY: Past Medical History  Diagnosis Date  . Hyperlipoproteinemia   . Obstructive sleep apnea     on C-pap.   [name] GERD (gastroesophageal reflux disease)     chronic  . Hx of colonic polyps   . 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 Vertigo     2/2 intersitial neuritis.   [name] Hearing loss of left ear   .  BPH (benign prostatic hyperplasia)   . 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 Hyperlipidemia   . Mucositis 11/01/2011    PAST SURGICAL HISTORY: Past Surgical History  Procedure Date  . Hernia repair 1990    Left  . Fine needle aspiration 08/26/11     LEFT NECK FNA, LYMPH NODE, METASTATIC SQUAMOUS CELL CARCINOMA  .  Gastrostomy 09/24/2011    Procedure: GASTROSTOMY;  Surgeon: Steven Malady, MD;  Location: [facility] OR;  Service: General;  Laterality: N/A;  open G-tube placement procedure start @1219   . Rigid esophagoscopy 09/24/2011    Procedure: RIGID ESOPHAGOSCOPY;  Surgeon: Steven Obey, MD;  Location: [facility] OR;  Service: ENT;  Laterality: N/A;  Esophagoscopy procedure end @1210     FAMILY HISTORY Family History  Problem Relation Age of Onset  . Alzheimer's disease Mother   . Tics Mother   . Hypertension Father   . Cancer Father 71    Prostate  . Stroke Paternal Grandmother   . Cancer Paternal Grandfather     Lung  and Prostate   the patient's father is alive at age 26. He lives in a retirement community. The patient's mother died at the age of 18 in 2009/12/05; she had significant Alzheimer's disease. The patient is an only child.  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 His wife Steven Robinson is a Music therapist in oil. The live on 8 acres and have 2 large horses, a donkey, a Engineer, manufacturing, chickens, etc. The patient's daughter Steven Robinson lives in [place] and is currently looking for a job. The patient's son Steven Robinson of work for an Economist. The patient has 2 grandchildren I., granddaughter age 65 and a grandson aged 8 months. He attends the [facility] Robinson.  ADVANCED DIRECTIVES: in place  HEALTH MAINTENANCE: History  Substance Use Topics  . Smoking status: Former Smoker -- 0.2 packs/day for 3 years    Quit date: 06/14/1972  . Smokeless tobacco: Not on file  . Alcohol Use: No     Hx of Drinking wine - Stopped 3 years ago     Allergies  Allergen Reactions  . Cyclobenzaprine Hcl Other (See Comments)    confusion    Current Outpatient Prescriptions  Medication Sig Dispense Refill  . amoxicillin-clavulanate (AUGMENTIN) 875-125 MG per tablet       . diazepam (VALIUM) 10 MG tablet       .  predniSONE (DELTASONE) 20 MG tablet       . TRANSDERM-SCOP 1.5 MG       . acyclovir (ZOVIRAX) 200 MG/5ML suspension       . ALPRAZolam (XANAX) 0.5 MG tablet prn      . Alum & Mag Hydroxide-Simeth (MAGIC MOUTHWASH W/LIDOCAINE) SOLN Take 10 mLs by mouth 4 (four) times daily as needed (alternate with carafate, take about 30 min before meals and bedtime).  480 mL  5  . aspirin EC 81 MG tablet Take 81 mg by mouth daily.      [name] dexlansoprazole (DEXILANT) 60 MG capsule Take 60 mg by mouth daily.      . diphenhydrAMINE (BENADRYL) 25 MG tablet Take 25 mg by mouth at bedtime.       . Ensure Plus (ENSURE PLUS) LIQD Begin one can of Ensure Plus QID with 60 ml free water before and after each bolus feeding.  In addition, either drink or flush tube with 300 ml free water TID.  If tolerated, increase  bolus feedings to 1.5 cans QID on Monday, May 13th. If tolerated, increase to goal of 2 cans Ensure Plus QID.  8 Can  0  . fentaNYL (DURAGESIC - DOSED MCG/HR) 12 MCG/HR       . fluconazole (DIFLUCAN) 100 MG tablet       . HYDROcodone-acetaminophen (LORTAB) 7.5-500 MG/15ML solution Take 30 mLs by mouth every 4 (four) hours as needed for pain.  120 mL  0  . HYDROmorphone (DILAUDID) 4 MG tablet Take 1 tablet (4 mg total) by mouth 2 (two) times daily as needed for pain (1-2 tablets twice a day as needed).  40 tablet  0  . LORazepam (ATIVAN) 0.5 MG tablet Take 0.5 mg by mouth every 6 (six) hours as needed.      [name] LOVAZA 1 G capsule       . ondansetron (ZOFRAN) 8 MG tablet Take by mouth every 12 (twelve) hours as needed. For N/V after 3rd day after chemotherapy      . prochlorperazine (COMPAZINE) 10 MG tablet Take 10 mg by mouth every 6 (six) hours as needed.      . promethazine (PHENERGAN) 25 MG suppository Place 1 suppository (25 mg total) rectally every 6 (six) hours as needed for nausea.  12 each  0  . rosuvastatin (CRESTOR) 40 MG tablet Take 40 mg by mouth daily.      . sucralfate (CARAFATE) 1 GM/10ML suspension Take  10 mLs (1 g total) by mouth 4 (four) times daily.  420 mL  5  . Tamsulosin HCl (FLOMAX) 0.4 MG CAPS Take 0.4 mg by mouth daily.       [name] zolpidem (AMBIEN) 10 MG tablet Take 10 mg by mouth at bedtime.         OBJECTIVE: Middle-aged white male who appears  comfortable, and in no acute distress Filed Vitals:   01/03/12 1351  BP: 110/72  Pulse: 72  Temp: 97 F (36.1 C)     Body mass index is 30.60 kg/(m^2).    ECOG FS: 2 Filed Weights   01/03/12 1351  Weight: 231 lb 14.4 oz (105.189 kg)  Weight is down approximately 6 pounds since his visit here on 12/13/2011.  (Weight was 237 lb .6 oz on 12/13/2011)  Physical Exam: HEENT:  Sclerae anicteric, conjunctivae pink.  Oropharynx clear.  Buccal mucosa is pink and moist. No mucositis or candidiasis.   Nodes:  No cervical, supraclavicular, or axillary lymphadenopathy palpated. No suspicious masses are palpated, specifically in the cervical region. Lungs:  Clear to auscultation bilaterally.  No crackles, rhonchi, or wheezes.   Heart:  Regular rate and rhythm.   Abdomen:  Soft, nontender.  Positive bowel sounds.  No organomegaly or masses palpated.  PEG tube is in place, with no drainage, erythema, or evidence of infection. Musculoskeletal:  No focal spinal tenderness to palpation.  Extremities:  Benign.  No peripheral edema or cyanosis.   Skin:  Benign.   Neuro:  Nonfocal. Alert and oriented x3.    LAB RESULTS: Lab Results  Component Value Date   WBC 3.9* 01/03/2012   NEUTROABS 2.7 01/03/2012   HGB 12.8* 01/03/2012   HCT 37.0* 01/03/2012   MCV 94.6 01/03/2012   PLT 175 01/03/2012      Chemistry      Component Value Date/Time   NA 134* 12/13/2011 0950   K 4.1 12/13/2011 0950   CL 97 12/13/2011 0950   CO2 28 12/13/2011 0950   BUN 13 12/13/2011 0950  CREATININE 0.81 12/13/2011 0950      Component Value Date/Time   CALCIUM 9.7 12/13/2011 0950   ALKPHOS 62 12/13/2011 0950   AST 18 12/13/2011 0950   ALT 13 12/13/2011 0950   BILITOT 0.4 12/13/2011 0950        STUDIES: No new results found.  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 He'll continue to increase his by mouth intake slowly. We will reevaluate when he returns in 2-3 weeks and assess when to remove his PEG tube. In the meanwhile he will continue to followup with speech therapy and our nutritionist, Zenovia Jarred.  He'll also continue to be followed by his primary care physician, Dr. Dewain Robinson, with regards to his vertigo and will continue on Valium, scopolamine, and Augmentin as directed. He is also being scheduled to followup with Dr. Gennie Alma for further evaluation. Of course,the dizziness may be due to autonomic dysfunction due to to the cisplatin and we will continue to follow him closely through our office as well.  He knows to call with any changes or problems prior to his next scheduled appointment.  Kynesha Guerin    01/03/2012

## 2012-01-17 ENCOUNTER — Telehealth: Payer: Self-pay | Admitting: Certified Registered Nurse Anesthetist

## 2012-01-17 ENCOUNTER — Ambulatory Visit: Payer: Self-pay

## 2012-01-17 ENCOUNTER — Ambulatory Visit: Payer: Managed Care, Other (non HMO)

## 2012-01-17 NOTE — Telephone Encounter (Signed)
Message received from switchboard " Pt. In a lot of pain".  Called pt.  at 1240hr, pt. is in a restaurant and informed me that he started experiencing upper right shoulder pain approximately 2 weeks ago and symptoms worsen usually around 4 am in the morning.  Pt. has not been taking anything for pain.  As per pt,. " I have taken myself off of all my pain medications about 3 weeks ago".  Pt. denied doing anything that would have possibly injured himself.  At this time, pt.'s pain level is at "3" and has an orthopedic appointment already scheduled for tomorrow.  Pt. declined this being an emergency and instructed pt. to call us back tomorrow post appointment if he would require  to see Dr. Darnelle Catalan sooner than his scheduled appointment on next Wednesday 8/14. Pt. Is aware Dr. Darnelle Catalan is on vacation.  Message relayed to [employer]. HL

## 2012-01-20 ENCOUNTER — Encounter: Payer: Self-pay | Admitting: Radiation Oncology

## 2012-01-21 ENCOUNTER — Ambulatory Visit
Admission: RE | Admit: 2012-01-21 | Discharge: 2012-01-21 | Disposition: A | Discharge status: Home / Self Care | Payer: Managed Care, Other (non HMO) | Source: Ambulatory Visit | Attending: Radiation Oncology | Admitting: Radiation Oncology

## 2012-01-21 ENCOUNTER — Encounter: Payer: Self-pay | Admitting: Radiation Oncology

## 2012-01-21 ENCOUNTER — Other Ambulatory Visit: Payer: Self-pay | Admitting: Radiation Oncology

## 2012-01-21 VITALS — BP 116/79 | HR 81 | Temp 97.6°F | Resp 18 | Wt 232.1 lb

## 2012-01-21 DIAGNOSIS — C760 Malignant neoplasm of head, face and neck: Secondary | ICD-10-CM

## 2012-01-21 DIAGNOSIS — C770 Secondary and unspecified malignant neoplasm of lymph nodes of head, face and neck: Secondary | ICD-10-CM

## 2012-01-21 DIAGNOSIS — B370 Candidal stomatitis: Secondary | ICD-10-CM

## 2012-01-21 DIAGNOSIS — C109 Malignant neoplasm of oropharynx, unspecified: Secondary | ICD-10-CM

## 2012-01-21 HISTORY — DX: Personal history of antineoplastic chemotherapy: Z92.21

## 2012-01-21 HISTORY — DX: Gastrostomy status: Z93.1

## 2012-01-21 HISTORY — DX: Personal history of irradiation: Z92.3

## 2012-01-21 MED ORDER — FLUCONAZOLE 100 MG PO TABS: 100.0000 mg | ORAL_TABLET | ORAL | Status: AC

## 2012-01-21 NOTE — Progress Notes (Signed)
HERE TODAY FOR FU OF HEAD AND NECK CANCER.  HAS STOPPED USING PEG TUBE BUT IS STILL TAKING BOOST PO, EATING MORE SOFT FOODS AND THICK LIQUIDS.  SKIN LOOKS GOOD BUT STILL HAS SOME SORE MOUTH WITH SOME "FLECKS OF SAND" IN IT.  CONTINUES TO HAVE DRY MOUTH.  HAS HAD VERTIGO SINCE COMPLETED CHEMO.  HAS DEVELOPED CERVICAL PAIN AND HAS SEEN AN ORTHOPEDIC, DR. Ranell Patrick FOR THIS AND  IS TAKING ROBAXIN.  ENERGY LEVEL STILL LOW.  WIFE SAYS HE IS STUMBLING AROUND AND IT WORRIES HER.  NOT EXPERIENCING ANY TRUE PAIN RIGHT NOW, MOST OF PAIN IN BACK.  WANTS TO KNOW WHEN HE CAN GET THE PEG TUBE OUT

## 2012-01-21 NOTE — Progress Notes (Signed)
Radiation Oncology         [phone] ________________________________  Name: Steven Robinson MRN: [id number]  Date: 01/21/2012  DOB: 1950-10-27  Follow-Up Visit Note  Diagnosis:  head and neck cancer of unknown primary, TX N2 B. M0  Interval Since Last Radiation: Patient completed 70 gray in 35 fractions on 11/22/2011  Narrative:  The patient returns today for routine follow-up. Overall he is doing well. He complains of dizziness but this has not seem like frank vertigo. He says that he feels lightheaded at times but it does not feel as though the room spinning around him. He also reports  some back pain above   his right scapula. He reports that he tore his rotator cuff years ago and has had issues with this before. He is undergoing physical therapy which has been helpful. He denies any neck or cervical pain. He reports that he has agreed his sensation in his mouth. He denies any pain in his mouth. He denies dysphasia. He is eating little food but drinking boost. He has been drinking boost exclusively for 3 weeks without using his PEG tube. His weight is stabilizing. He reports that his taste buds have not recovered yet. His wife feels that his judgment is not quite back to baseline. This concerns her a little. The patient feels that he is more laid back than usual because of his experiences with his diagnosis and treatment. He has not had any memory loss.                   ALLERGIES:  is allergic to cyclobenzaprine hcl.  Meds: Current Outpatient Prescriptions  Medication Sig Dispense Refill  . acyclovir (ZOVIRAX) 200 MG/5ML suspension       . ALPRAZolam (XANAX) 0.5 MG tablet prn      . Alum & Mag Hydroxide-Simeth (MAGIC MOUTHWASH W/LIDOCAINE) SOLN Take 10 mLs by mouth 4 (four) times daily as needed (alternate with carafate, take about 30 min before meals and bedtime).  480 mL  5  . amoxicillin-clavulanate (AUGMENTIN) 875-125 MG per tablet       . aspirin EC 81 MG tablet Take 81 mg by mouth  daily.      [name] dexlansoprazole (DEXILANT) 60 MG capsule Take 60 mg by mouth daily.      . diazepam (VALIUM) 10 MG tablet       . diphenhydrAMINE (BENADRYL) 25 MG tablet Take 25 mg by mouth at bedtime.       . Ensure Plus (ENSURE PLUS) LIQD Begin one can of Ensure Plus QID with 60 ml free water before and after each bolus feeding.  In addition, either drink or flush tube with 300 ml free water TID.  If tolerated, increase bolus feedings to 1.5 cans QID on Monday, May 13th. If tolerated, increase to goal of 2 cans Ensure Plus QID.  8 Can  0  . fentaNYL (DURAGESIC - DOSED MCG/HR) 12 MCG/HR       . fluconazole (DIFLUCAN) 100 MG tablet Take 1 tablet (100 mg total) by mouth as directed. Take 2 tablets day 1, then 1 tablet for 4 more days  6 tablet  0  . HYDROcodone-acetaminophen (LORTAB) 7.5-500 MG/15ML solution Take 30 mLs by mouth every 4 (four) hours as needed for pain.  120 mL  0  . HYDROmorphone (DILAUDID) 4 MG tablet Take 1 tablet (4 mg total) by mouth 2 (two) times daily as needed for pain (1-2 tablets twice a day as needed).  40  tablet  0  . LORazepam (ATIVAN) 0.5 MG tablet Take 0.5 mg by mouth every 6 (six) hours as needed.      [name] LOVAZA 1 G capsule       . ondansetron (ZOFRAN) 8 MG tablet Take by mouth every 12 (twelve) hours as needed. For N/V after 3rd day after chemotherapy      . predniSONE (DELTASONE) 20 MG tablet       . prochlorperazine (COMPAZINE) 10 MG tablet Take 10 mg by mouth every 6 (six) hours as needed.      . promethazine (PHENERGAN) 25 MG suppository Place 1 suppository (25 mg total) rectally every 6 (six) hours as needed for nausea.  12 each  0  . rosuvastatin (CRESTOR) 40 MG tablet Take 40 mg by mouth daily.      . sucralfate (CARAFATE) 1 GM/10ML suspension Take 10 mLs (1 g total) by mouth 4 (four) times daily.  420 mL  5  . Tamsulosin HCl (FLOMAX) 0.4 MG CAPS Take 0.4 mg by mouth daily.       . TRANSDERM-SCOP 1.5 MG       . zolpidem (AMBIEN) 10 MG tablet Take 10 mg by mouth  at bedtime.         Physical Findings: The patient is in no acute distress. Patient is alert and oriented.  weight is 232 lb 1.6 oz (105.28 kg). His oral temperature is 97.6 F (36.4 C). His blood pressure is 116/79 and his pulse is 81. His respiration is 18. [name]   Extraocular movements are intact. Oropharynx demonstrates a white/brown color to the surface of his tongue, which has a furry consistency. No lesions of concern to suggest recurrence. Mucous membranes are dry.  His neck demonstrates no palpable lymphadenopathy.  some residual hyperpigmentation but the skin is intact. Tympanic membranes are occluded by wax.   Lab Findings: Lab Results  Component Value Date   WBC 3.9* 01/03/2012   HGB 12.8* 01/03/2012   HCT 37.0* 01/03/2012   MCV 94.6 01/03/2012   PLT 175 01/03/2012    CMP     Component Value Date/Time   NA 138 01/03/2012 1338   K 4.1 01/03/2012 1338   CL 100 01/03/2012 1338   CO2 27 01/03/2012 1338   GLUCOSE 98 01/03/2012 1338   BUN 12 01/03/2012 1338   CREATININE 0.81 01/03/2012 1338   CALCIUM 9.5 01/03/2012 1338   PROT 6.8 12/13/2011 0950   ALBUMIN 3.5 12/13/2011 0950   AST 18 12/13/2011 0950   ALT 13 12/13/2011 0950   ALKPHOS 62 12/13/2011 0950   BILITOT 0.4 12/13/2011 0950   GFRNONAA >90 09/24/2011 0450   GFRAA >90 09/24/2011 0450      Radiographic Findings: No results found.  Impression:  The patient is recovering from the effects of radiation.   Plan:  PET/CT scan in early to mid September and followup closely thereafter. Consider removal of PEG tube if this scan looks satisfactory.  I will prescribe fluconazole in case he has some residual thrush.  Encouragement and reassurance provided to the patient and his wife. I look forward to seeing him in September. They know to call if issues before then.  _____________________________________   Lonie Peak, MD

## 2012-01-24 ENCOUNTER — Other Ambulatory Visit: Payer: Self-pay | Admitting: *Deleted

## 2012-01-24 ENCOUNTER — Telehealth: Payer: Self-pay | Admitting: *Deleted

## 2012-01-24 NOTE — Telephone Encounter (Signed)
Called Mr. Steven Robinson to inform him that his PET Scan has been scheduled, as ordered by Dr. Basilio Cairo, for 02/24/12 at 0815.  He stated understanding.

## 2012-01-26 ENCOUNTER — Other Ambulatory Visit (HOSPITAL_BASED_OUTPATIENT_CLINIC_OR_DEPARTMENT_OTHER): Payer: Managed Care, Other (non HMO) | Admitting: Lab

## 2012-01-26 ENCOUNTER — Telehealth: Payer: Self-pay | Admitting: *Deleted

## 2012-01-26 ENCOUNTER — Ambulatory Visit: Payer: Managed Care, Other (non HMO) | Admitting: Nutrition

## 2012-01-26 ENCOUNTER — Ambulatory Visit (HOSPITAL_BASED_OUTPATIENT_CLINIC_OR_DEPARTMENT_OTHER): Payer: Managed Care, Other (non HMO) | Admitting: Oncology

## 2012-01-26 VITALS — BP 103/69 | HR 83 | Temp 97.0°F | Resp 20 | Ht 73.0 in | Wt 229.6 lb

## 2012-01-26 DIAGNOSIS — C760 Malignant neoplasm of head, face and neck: Secondary | ICD-10-CM

## 2012-01-26 DIAGNOSIS — R42 Dizziness and giddiness: Secondary | ICD-10-CM

## 2012-01-26 DIAGNOSIS — C109 Malignant neoplasm of oropharynx, unspecified: Secondary | ICD-10-CM

## 2012-01-26 LAB — CBC WITH DIFFERENTIAL/PLATELET
Basophils Absolute: 0 10*3/uL (ref 0.0–0.1)
Eosinophils Absolute: 0.2 10*3/uL (ref 0.0–0.5)
HGB: 13.1 g/dL (ref 13.0–17.1)
NEUT#: 2.9 10*3/uL (ref 1.5–6.5)
RDW: 16.6 % — ABNORMAL HIGH (ref 11.0–14.6)
lymph#: 0.4 10*3/uL — ABNORMAL LOW (ref 0.9–3.3)

## 2012-01-26 MED ORDER — PILOCARPINE HCL 5 MG PO TABS: 5.0000 mg | ORAL_TABLET | Freq: Three times a day (TID) | ORAL | Status: DC

## 2012-01-26 MED ORDER — GABAPENTIN 300 MG PO CAPS: 300.0000 mg | ORAL_CAPSULE | Freq: Every evening | ORAL | Status: DC | PRN

## 2012-01-26 MED ORDER — HYDROCODONE-ACETAMINOPHEN 5-500 MG PO TABS: 1.0000 | ORAL_TABLET | Freq: Four times a day (QID) | ORAL | Status: AC | PRN

## 2012-01-26 NOTE — Progress Notes (Signed)
ID: [place]   DOB: Jun 07, 1951  MR#: [id number]  WUJ#:[id number]  HISTORY OF PRESENT ILLNESS: HPI: The patient noted an enlarging left neck mass early March 2013, and brought this to the attention of his PCP, Dr. Dewain Penning. She obtained a neck CT at [facility] 08/20/2011, which showed multiple enlarged nodes in the left mid-neck along the jugular v., the largest 2.0 cm. She referred the patient to Suzanna Obey in ENT and FNA of one of the enlarged nodes was positive for squamous cell carcinoma ([id number]). A p16 stain was read as equivocal. PET scan 09/03/2011 showed SUVs >6 in the left oropharynx and left jugular chain nodes; SUV of 5.49 was noted in the larynx. There was no evidence of contralateral or disseminated disease.  This was consistent with a clinical T1 N2,stage IVA left oropharyngeal SCCA. However multiple biopsies under Dr. Jearld Fenton 09/09/2011 [[id number]] were all negative. The pathologic staging therefore is TX N2, which impacts radiation planning.  The patient met with Drs. Cruzita Lederer, and IMRT with concurrent chemosensitization was planned. The patient had dental evaluation under Dr. Kristin Bruins, several extractions under Dr. Chales Salmon, had audiometry (showing mild high-frequency hearing loss) and met with nutrition. A PEG tube placed by IR unfortunately became dislodged and infected, and had to be replaced through an open procedure under Violeta Gelinas. This delayed the start of chemotherapy and caused a change in plan from the original CDDP at 100 mg/m2 Q3w to 40 mg/M2 weekly. Radiation was started 10/04/2011 and chemotherapy 10/25/2011. The patient's subsequent history is as detailed below.   INTERVAL HISTORY: Steven Robinson returns today with his wife [name] for followup of his squamous cell carcinoma of the oropharynx. He is doing remarkably well I think as far as his head and neck cancer is concerned. They have had some difficult family developments, involving trauma to a grandchild. This is causing some  increase in their of stress level.  REVIEW OF SYSTEMS: He has not used his PEG tube at all for the last 3 weeks. He is still using boost but he is taking it now by mouth. He is also eating some soft moist foods. He saw Zenovia Jarred recently and she started him on Diflucan for a likely thrush. It doesn't hurt to swallow, and food does not get stuck when he swallows. He has trouble sleeping because of the pain in his upper right scapula. This is not exactly a new pain and he dates it to a remote accident, but it has become worse lately. He also continues to have some dizziness problems. Some of it seems to be an autonomic-related, such as when he is squatting and then rises and gets dizzy. Other Restasis are different and may involve some in her ear issues, such as when he turns his head rapidly from one side to the other. Except for the right scapular pain, which she feels mostly at night, he is pain-free and has been using hydrocodone only very occasionally. A detailed review of systems was otherwise stable.  PAST MEDICAL HISTORY: Past Medical History  Diagnosis Date  . Hyperlipoproteinemia   . Obstructive sleep apnea     on C-pap.   [name] GERD (gastroesophageal reflux disease)     chronic  . Hx of colonic polyps   . 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 Vertigo     2/2 intersitial neuritis.   [name] Hearing loss of left ear   . BPH (benign prostatic hyperplasia)   . 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  Hyperlipidemia   . Mucositis 11/01/2011  . S/P radiation therapy 10/04/11-11/22/11    Mucosal Axis/Neck:7000cGy/35Fractions  . Status post chemotherapy     CDDP During with Radiation therapy  . Gastrostomy tube dependent     PAST SURGICAL HISTORY: Past Surgical History  Procedure Date  . Hernia repair 1990    Left  . Fine needle aspiration 08/26/11     LEFT NECK FNA, LYMPH NODE, METASTATIC SQUAMOUS CELL CARCINOMA  . Gastrostomy 09/24/2011    Procedure: GASTROSTOMY;  Surgeon: Liz Malady, MD;  Location: [facility] OR;  Service: General;  Laterality: N/A;  open G-tube placement procedure start @1219   . Rigid esophagoscopy 09/24/2011    Procedure: RIGID ESOPHAGOSCOPY;  Surgeon: Suzanna Obey, MD;  Location[facility] OR;  Service: ENT;  Laterality: N/A;  Esophagoscopy procedure end @1210     FAMILY HISTORY Family History  Problem Relation Age of Onset  . Alzheimer's disease Mother   . Tics Mother   . Hypertension Father   . Cancer Father 3    Prostate  . Stroke Paternal Grandmother   . Cancer Paternal Grandfather     Lung  and Prostate   the patient's father is alive at age 65. He lives in a retirement community. The patient's mother died at the age of 103 in 2009/11/24; she had significant Alzheimer's disease. The patient is an only child.  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 His wife Steven Robinson is a Music therapist in oil. The live on 8 acres and have 2 large horses, a donkey, a Engineer, manufacturing, chickens, etc. The patient's daughter [name] lives in [place] and is currently looking for a job. The patient's son [name] works for an Economist. The patient has 2 grandchildren, granddaughter age 88 and a grandson aged 8 months. He attends the [employer].  ADVANCED DIRECTIVES: in place  HEALTH MAINTENANCE: History  Substance Use Topics  . Smoking status: Former Smoker -- 0.2 packs/day for 3 years    Quit date: 06/14/1972  . Smokeless tobacco: Not on file  . Alcohol Use: No     Hx of Drinking wine - Stopped 3 years ago     Allergies  Allergen Reactions  . Cyclobenzaprine Hcl Other (See Comments)    confusion    Current Outpatient Prescriptions  Medication Sig Dispense Refill  . acyclovir (ZOVIRAX) 200 MG/5ML suspension       . ALPRAZolam (XANAX) 0.5 MG tablet prn      . Alum & Mag Hydroxide-Simeth (MAGIC MOUTHWASH W/LIDOCAINE) SOLN Take 10 mLs by mouth 4  (four) times daily as needed (alternate with carafate, take about 30 min before meals and bedtime).  480 mL  5  . amoxicillin-clavulanate (AUGMENTIN) 875-125 MG per tablet       . aspirin EC 81 MG tablet Take 81 mg by mouth daily.      [name] dexlansoprazole (DEXILANT) 60 MG capsule Take 60 mg by mouth daily.      . diazepam (VALIUM) 10 MG tablet       . diphenhydrAMINE (BENADRYL) 25 MG tablet Take 25 mg by mouth at bedtime.       . Ensure Plus (ENSURE PLUS) LIQD Begin one can of Ensure Plus QID with 60 ml free water before and after each bolus feeding.  In addition, either drink or flush tube with 300 ml free water TID.  If tolerated, increase bolus feedings to 1.5 cans  QID on Monday, May 13th. If tolerated, increase to goal of 2 cans Ensure Plus QID.  8 Can  0  . fentaNYL (DURAGESIC - DOSED MCG/HR) 12 MCG/HR       . fluconazole (DIFLUCAN) 100 MG tablet Take 1 tablet (100 mg total) by mouth as directed. Take 2 tablets day 1, then 1 tablet for 4 more days  6 tablet  0  . HYDROcodone-acetaminophen (LORTAB) 7.5-500 MG/15ML solution Take 30 mLs by mouth every 4 (four) hours as needed for pain.  120 mL  0  . HYDROmorphone (DILAUDID) 4 MG tablet Take 1 tablet (4 mg total) by mouth 2 (two) times daily as needed for pain (1-2 tablets twice a day as needed).  40 tablet  0  . LORazepam (ATIVAN) 0.5 MG tablet Take 0.5 mg by mouth every 6 (six) hours as needed.      [name] LOVAZA 1 G capsule       . ondansetron (ZOFRAN) 8 MG tablet Take by mouth every 12 (twelve) hours as needed. For N/V after 3rd day after chemotherapy      . predniSONE (DELTASONE) 20 MG tablet       . prochlorperazine (COMPAZINE) 10 MG tablet Take 10 mg by mouth every 6 (six) hours as needed.      . promethazine (PHENERGAN) 25 MG suppository Place 1 suppository (25 mg total) rectally every 6 (six) hours as needed for nausea.  12 each  0  . rosuvastatin (CRESTOR) 40 MG tablet Take 40 mg by mouth daily.      . sucralfate (CARAFATE) 1 GM/10ML suspension  Take 10 mLs (1 g total) by mouth 4 (four) times daily.  420 mL  5  . Tamsulosin HCl (FLOMAX) 0.4 MG CAPS Take 0.4 mg by mouth daily.       . TRANSDERM-SCOP 1.5 MG       . zolpidem (AMBIEN) 10 MG tablet Take 10 mg by mouth at bedtime.         OBJECTIVE: Middle-aged white male  in no acute distress Filed Vitals:   01/26/12 1501  BP: 103/69  Pulse: 83  Temp: 97 F (36.1 C)  Resp: 20     Body mass index is 30.29 kg/(m^2).    ECOG FS: 1 Filed Weights   01/26/12 1501  Weight: 229 lb 9.6 oz (104.146 kg)    (Weight was 237 lb .6 oz on 12/13/2011)  Sclerae unicteric Oropharynx shows the tongue to be partially coated, with what is consistent with thrush. Overall the oropharynx is fairly dry. I do not see ulcerations. No cervical or supraclavicular adenopathy; fair to good range of motion on turning the neck to right and left, which does not exacerbate or cause pain in the right scapular area. Lungs no rales or rhonchi Heart regular rate and rhythm Abd benign; Peggy in place, with no evidence of infection. MSK the right scapular pain is superior and about the midpoint of the scapula, and requires a great deal of pressure to identified. It is not relieved or exacerbated by pressure. Neuro: Abduction of of the upper extremities bilaterally is 5 over 5    LAB RESULTS: Lab Results  Component Value Date   WBC 3.9* 01/26/2012   NEUTROABS 2.9 01/26/2012   HGB 13.1 01/26/2012   HCT 38.6 01/26/2012   MCV 94.3 01/26/2012   PLT 134* 01/26/2012      Chemistry      Component Value Date/Time   NA 138 01/03/2012 1338   K  4.1 01/03/2012 1338   CL 100 01/03/2012 1338   CO2 27 01/03/2012 1338   BUN 12 01/03/2012 1338   CREATININE 0.81 01/03/2012 1338      Component Value Date/Time   CALCIUM 9.5 01/03/2012 1338   ALKPHOS 62 12/13/2011 0950   AST 18 12/13/2011 0950   ALT 13 12/13/2011 0950   BILITOT 0.4 12/13/2011 0950       STUDIES: No results found.   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 Even with the current of insomnia issues he still has a good and improving functional status. We talked about stress issues and while certainly we want to minimize that, stress as a part of life and he seems to me dealing with extraneous stresses as well as anyone could. He seems to know his limits and is able to tell [name] when he needs to take a nap or be by himself. I think he would benefit him to see Dr. Kristin Bruins regarding a preventive mouth care. In the meantime I have suggested he try saliva substitutes and have started him on pilocarpine 5 mg 3 times a day. He will tell me if he has any problems from that medication, or if it doesn't work for him. We will also start to monitor his thyroid function on a every 3 month basis. He has a PET scan scheduled for the second week in September. If that is negative and he continues to do well with his nutrition, I would vote in favor of this our Gigi Gin being removed. He will see [place] again in one month. Labs before that visit we'll also include a magnesium level. Tashawn Laswell C    01/26/2012

## 2012-01-26 NOTE — Telephone Encounter (Signed)
Deliver referral form to the dentist office in the main hospital [place]

## 2012-01-26 NOTE — Telephone Encounter (Signed)
Gave patient information on where to go to get his pet scan completed gave patient appointment on 02-28-2012 starting at 1:15pm with Amy Jobie Quaker

## 2012-01-26 NOTE — Progress Notes (Signed)
Mr. Markin presents to nutrition followup.  His weight is stable at 232.1 pounds and has been stable over the last 3 weeks or so.  He is no longer using his feeding tube for nutrition support.  He is flushing it with water on a daily basis.  He has been maintaining his weight on oral food intake and Boost nutrition supplements for the past 3 weeks.  He continues to drink a lot a water by mouth.  He is tolerating a small variety of soft foods.  He does complain of dry mouth.  NUTRITION DIAGNOSIS:  Inadequate oral intake, resolved.  I have provided support and encouragement to Mr. Kraeger and his wife to continue to incorporate soft, moist, gradually increasing volume as tolerated.  He will continue Boost Plus as needed to maintain his weight for continued healing.  I have encouraged him to contact me if he has any questions or concerns.  The patient is appreciative and agreeable. There is no followup scheduled at this time.   ______________________________ Zenovia Jarred, RD, CSO, LDN Clinical Nutrition Specialist BN/MEDQ  D:  01/26/2012  T:  01/26/2012  Job:  [id number]

## 2012-02-02 ENCOUNTER — Ambulatory Visit: Payer: Self-pay | Admitting: Oncology

## 2012-02-08 ENCOUNTER — Telehealth: Payer: Self-pay | Admitting: *Deleted

## 2012-02-08 NOTE — Telephone Encounter (Signed)
[place] called asking if he may receive cortisone injections and will these injections affect his PET scans in September.  Going to see orthopedic physician this morning about extreme pain to right upper back at scapula that keeps him awake at night.  Physical therapy makes it hurt worse.  Pain getting worse and now fingers and arm are getting numb.     Dr. Darnelle Catalan notified.  Verbal order received and read back that pt. May receive cortisone injections if needed.  Dr. Darnelle Catalan asked for a f/u call from patient after he see's orthopedic provider.  Steven [place] says he will call with update.

## 2012-02-21 ENCOUNTER — Telehealth: Payer: Self-pay | Admitting: *Deleted

## 2012-02-21 NOTE — Telephone Encounter (Signed)
Spoke with patient who reports he's having back pain and possible spinal compression.  Had an MRI a week ago at [facility] and an Epidural spinal on Friday.  Dr. Ranell Patrick would like him to begin Physical Therapy but the therapist want medical clearance due to having head and neck cancer and has received radiation.  Asked Kurk to have Physical Therapist send the medical release paperwork and I'll notify providers.   Dr. Darnelle Catalan to return to office 02-28-2012 and Zavior is to f/u on this day with mid-level.

## 2012-02-24 ENCOUNTER — Encounter (HOSPITAL_COMMUNITY)
Admission: RE | Admit: 2012-02-24 | Discharge: 2012-02-24 | Disposition: A | Discharge status: Home / Self Care | Payer: Managed Care, Other (non HMO) | Source: Ambulatory Visit | Attending: Radiation Oncology | Admitting: Radiation Oncology

## 2012-02-24 ENCOUNTER — Encounter (HOSPITAL_COMMUNITY): Payer: Self-pay

## 2012-02-24 DIAGNOSIS — K4090 Unilateral inguinal hernia, without obstruction or gangrene, not specified as recurrent: Secondary | ICD-10-CM | POA: Insufficient documentation

## 2012-02-24 DIAGNOSIS — N400 Enlarged prostate without lower urinary tract symptoms: Secondary | ICD-10-CM | POA: Insufficient documentation

## 2012-02-24 DIAGNOSIS — C770 Secondary and unspecified malignant neoplasm of lymph nodes of head, face and neck: Secondary | ICD-10-CM

## 2012-02-24 DIAGNOSIS — I2510 Atherosclerotic heart disease of native coronary artery without angina pectoris: Secondary | ICD-10-CM | POA: Insufficient documentation

## 2012-02-24 DIAGNOSIS — Z931 Gastrostomy status: Secondary | ICD-10-CM | POA: Insufficient documentation

## 2012-02-24 DIAGNOSIS — C760 Malignant neoplasm of head, face and neck: Secondary | ICD-10-CM | POA: Insufficient documentation

## 2012-02-24 MED ORDER — FLUDEOXYGLUCOSE F - 18 (FDG) INJECTION
15.5000 | Freq: Once | INTRAVENOUS | Status: AC | PRN
  Administered 2012-02-24: 15.5 via INTRAVENOUS

## 2012-02-25 ENCOUNTER — Ambulatory Visit: Payer: Managed Care, Other (non HMO) | Admitting: Radiation Oncology

## 2012-02-28 ENCOUNTER — Ambulatory Visit: Payer: Managed Care, Other (non HMO)

## 2012-02-28 ENCOUNTER — Ambulatory Visit (HOSPITAL_BASED_OUTPATIENT_CLINIC_OR_DEPARTMENT_OTHER): Payer: Managed Care, Other (non HMO) | Admitting: Physician Assistant

## 2012-02-28 ENCOUNTER — Ambulatory Visit: Payer: Managed Care, Other (non HMO) | Attending: Radiation Oncology

## 2012-02-28 ENCOUNTER — Ambulatory Visit (HOSPITAL_BASED_OUTPATIENT_CLINIC_OR_DEPARTMENT_OTHER): Payer: Managed Care, Other (non HMO) | Admitting: Lab

## 2012-02-28 ENCOUNTER — Encounter: Payer: Self-pay | Admitting: Physician Assistant

## 2012-02-28 ENCOUNTER — Telehealth: Payer: Self-pay | Admitting: Radiation Oncology

## 2012-02-28 VITALS — BP 116/69 | HR 71 | Temp 97.7°F | Resp 20 | Ht 73.0 in | Wt 222.5 lb

## 2012-02-28 DIAGNOSIS — C760 Malignant neoplasm of head, face and neck: Secondary | ICD-10-CM

## 2012-02-28 DIAGNOSIS — IMO0001 Reserved for inherently not codable concepts without codable children: Secondary | ICD-10-CM | POA: Insufficient documentation

## 2012-02-28 DIAGNOSIS — C109 Malignant neoplasm of oropharynx, unspecified: Secondary | ICD-10-CM

## 2012-02-28 DIAGNOSIS — R1313 Dysphagia, pharyngeal phase: Secondary | ICD-10-CM | POA: Insufficient documentation

## 2012-02-28 DIAGNOSIS — C770 Secondary and unspecified malignant neoplasm of lymph nodes of head, face and neck: Secondary | ICD-10-CM

## 2012-02-28 DIAGNOSIS — R1906 Epigastric swelling, mass or lump: Secondary | ICD-10-CM

## 2012-02-28 LAB — COMPREHENSIVE METABOLIC PANEL (CC13)
AST: 18 U/L (ref 5–34)
Albumin: 4.1 g/dL (ref 3.5–5.0)
BUN: 15 mg/dL (ref 7.0–26.0)
Calcium: 9.4 mg/dL (ref 8.4–10.4)
Chloride: 102 mEq/L (ref 98–107)
Potassium: 3.8 mEq/L (ref 3.5–5.1)
Total Protein: 6.7 g/dL (ref 6.4–8.3)

## 2012-02-28 LAB — CBC WITH DIFFERENTIAL/PLATELET
Eosinophils Absolute: 0.1 10*3/uL (ref 0.0–0.5)
HCT: 37.8 % — ABNORMAL LOW (ref 38.4–49.9)
LYMPH%: 8.9 % — ABNORMAL LOW (ref 14.0–49.0)
MONO#: 0.4 10*3/uL (ref 0.1–0.9)
NEUT#: 3.5 10*3/uL (ref 1.5–6.5)
NEUT%: 80.5 % — ABNORMAL HIGH (ref 39.0–75.0)
Platelets: 139 10*3/uL — ABNORMAL LOW (ref 140–400)
WBC: 4.3 10*3/uL (ref 4.0–10.3)

## 2012-02-28 LAB — TSH: TSH: 1.121 u[IU]/mL (ref 0.350–4.500)

## 2012-02-28 MED ORDER — HYDROMORPHONE HCL 2 MG PO TABS: ORAL_TABLET | ORAL | Status: DC

## 2012-02-28 NOTE — Progress Notes (Signed)
ID: Steven Robinson   DOB: November 29, 1950  MR#: [id number]  BJY#:[id number]  HISTORY OF PRESENT ILLNESS: HPI: The patient noted an enlarging left neck mass early March 2013, and brought this to the attention of his PCP, Dr. Dewain Penning. She obtained a neck CT at [facility] 08/20/2011, which showed multiple enlarged nodes in the left mid-neck along the jugular v., the largest 2.0 cm. She referred the patient to Suzanna Obey in ENT and FNA of one of the enlarged nodes was positive for squamous cell carcinoma ([id number]). A p16 stain was read as equivocal. PET scan 09/03/2011 showed SUVs >6 in the left oropharynx and left jugular chain nodes; SUV of 5.49 was noted in the larynx. There was no evidence of contralateral or disseminated disease.  This was consistent with a clinical T1 N2,stage IVA left oropharyngeal SCCA. However multiple biopsies under Dr. Jearld Fenton 09/09/2011 [[id number]] were all negative. The pathologic staging therefore is TX N2, which impacts radiation planning.  The patient met with Drs. Cruzita Lederer, and IMRT with concurrent chemosensitization was planned. The patient had dental evaluation under Dr. Kristin Bruins, several extractions under Dr. Chales Salmon, had audiometry (showing mild high-frequency hearing loss) and met with nutrition. A PEG tube placed by IR unfortunately became dislodged and infected, and had to be replaced through an open procedure under Violeta Gelinas. This delayed the start of chemotherapy and caused a change in plan from the original CDDP at 100 mg/m2 Q3w to 40 mg/M2 weekly. Radiation was started 10/04/2011 and chemotherapy 10/25/2011. The patient's subsequent history is as detailed below.   INTERVAL HISTORY: Jacobs returns today with his wife [name] for followup of his squamous cell carcinoma of the oropharynx. He appears to be doing remarkably well  as far as his head and neck cancer is concerned. He is eating a little more, and is taking in 8 supplements of boost daily. He continues to be seen by  our nutritionist, and is also undergoing speech therapy.  Interval history is remarkable for Rykan having undergone a PET scan which has been reviewed with him by Dr. Basilio Cairo. Overall, the results were very positive.  Tilton's biggest complaint today is the continued pain in his right scapula, extending down into the right arm, and sometimes affecting the right chest wall. Pain is only moderately well controlled with hydrocodone and Skelaxin. This is being followed by Dr. Ethelene Hal.  Ismael is status post epidural injection with minimal relief. He has been undergoing physical therapy including traction with minimal relief. He is scheduled for a nerve conduction study later this week.  He has limited range of motion in the right upper extremity, and this is significantly limiting his day-to-day activities and interfering with his functional status. Of note, he is right handed.   REVIEW OF SYSTEMS: Icker denies any recent fevers or chills. He has noted no rashes or skin changes and denies any abnormal bleeding. He is eating better as noted above, and has had no nausea, emesis, diarrhea, or constipation. He is not utilizing the PEG tube. He did notice a "hard, protruding lump" in his abdomen this weekend. It is not painful. It does seem to protrude and is hard or when he is upright, but decreases when he is lying down. He has had no cough or increased shortness of breath and denies chest pain or palpitations. No abnormal headaches or dizziness. No change in vision. No peripheral swelling. He does feel tired, and is having some difficulty sleeping, primarily secondary to the pain in the right upper  extremity.   A detailed review of systems was otherwise stable.  PAST MEDICAL HISTORY: Past Medical History  Diagnosis Date  . Hyperlipoproteinemia   . Obstructive sleep apnea     on C-pap.   [name] GERD (gastroesophageal reflux disease)     chronic  . Hx of colonic polyps   . Herniated lumbar intervertebral disc      L4-5  . Vertigo     2/2 intersitial neuritis.   [name] Hearing loss of left ear   . BPH (benign prostatic hyperplasia)   . 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 Hyperlipidemia   . Mucositis 11/01/2011  . S/P radiation therapy 10/04/11-11/22/11    Mucosal Axis/Neck:7000cGy/35Fractions  . Status post chemotherapy     CDDP During with Radiation therapy  . Gastrostomy tube dependent     PAST SURGICAL HISTORY: Past Surgical History  Procedure Date  . Hernia repair 1990    Left  . Fine needle aspiration 08/26/11     LEFT NECK FNA, LYMPH NODE, METASTATIC SQUAMOUS CELL CARCINOMA  . Gastrostomy 09/24/2011    Procedure: GASTROSTOMY;  Surgeon: Liz Malady, MD;  Location[facility] OR;  Service: General;  Laterality: N/A;  open G-tube placement procedure start @1219   . Rigid esophagoscopy 09/24/2011    Procedure: RIGID ESOPHAGOSCOPY;  Surgeon: Suzanna Obey, MD;  Location: [facility] OR;  Service: ENT;  Laterality: N/A;  Esophagoscopy procedure end @1210     FAMILY HISTORY Family History  Problem Relation Age of Onset  . Alzheimer's disease Mother   . Tics Mother   . Hypertension Father   . Cancer Father 41    Prostate  . Stroke Paternal Grandmother   . Cancer Paternal Grandfather     Lung  and Prostate   the patient's father is alive at age 48. He lives in a retirement community. The patient's mother died at the age of 59 in 11/20/2009; she had significant Alzheimer's disease. The patient is an only child.  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 His wife [name] is a Music therapist in oil. The live on 8 acres and have 2 large horses, a donkey, a Engineer, manufacturing, chickens, etc. The patient's daughter [name] lives in [place] and is currently looking for a job. The patient's son [name] works for an Economist. The patient has 2 grandchildren, granddaughter age 24 and a grandson aged 8 months. He attends the [employer].  ADVANCED DIRECTIVES: in place  HEALTH MAINTENANCE: History  Substance Use Topics  . Smoking status: Former Smoker -- 0.2 packs/day for 3 years    Quit date: 06/14/1972  . Smokeless tobacco: Not on file  . Alcohol Use: No     Hx of Drinking wine - Stopped 3 years ago     Allergies  Allergen Reactions  . Cyclobenzaprine Hcl Other (See Comments)    confusion    Current Outpatient Prescriptions  Medication Sig Dispense Refill  . metaxalone (SKELAXIN) 800 MG tablet       . acyclovir (ZOVIRAX) 200 MG/5ML suspension       . ALPRAZolam (XANAX) 0.5 MG tablet prn      . Alum & Mag Hydroxide-Simeth (MAGIC MOUTHWASH W/LIDOCAINE) SOLN Take 10 mLs by mouth 4 (four) times daily as needed (alternate with carafate, take about 30 min before meals and bedtime).  480 mL  5  . amoxicillin-clavulanate (AUGMENTIN) 875-125 MG per tablet       .  aspirin EC 81 MG tablet Take 81 mg by mouth daily.      [name] dexlansoprazole (DEXILANT) 60 MG capsule Take 60 mg by mouth daily.      . diazepam (VALIUM) 10 MG tablet       . diphenhydrAMINE (BENADRYL) 25 MG tablet Take 25 mg by mouth at bedtime.       . Ensure Plus (ENSURE PLUS) LIQD Begin one can of Ensure Plus QID with 60 ml free water before and after each bolus feeding.  In addition, either drink or flush tube with 300 ml free water TID.  If tolerated, increase bolus feedings to 1.5 cans QID on Monday, May 13th. If tolerated, increase to goal of 2 cans Ensure Plus QID.  8 Can  0  . fentaNYL (DURAGESIC - DOSED MCG/HR) 12 MCG/HR       . gabapentin (NEURONTIN) 300 MG capsule Take 1 capsule (300 mg total) by mouth at bedtime as needed.  30 capsule  6  . HYDROcodone-acetaminophen (LORTAB) 7.5-500 MG/15ML solution Take 30 mLs by mouth every 4 (four) hours as needed for pain.  120 mL  0  . HYDROmorphone (DILAUDID) 2 MG tablet 1 PO QAM AND 2 PO QPM  90 tablet  0  . LORazepam (ATIVAN) 0.5 MG tablet Take 0.5 mg by mouth every 6 (six) hours as needed.       [name] LOVAZA 1 G capsule       . ondansetron (ZOFRAN) 8 MG tablet Take by mouth every 12 (twelve) hours as needed. For N/V after 3rd day after chemotherapy      . pilocarpine (SALAGEN) 5 MG tablet Take 1 tablet (5 mg total) by mouth 3 (three) times daily.  90 tablet  6  . predniSONE (DELTASONE) 20 MG tablet       . prochlorperazine (COMPAZINE) 10 MG tablet Take 10 mg by mouth every 6 (six) hours as needed.      . promethazine (PHENERGAN) 25 MG suppository Place 1 suppository (25 mg total) rectally every 6 (six) hours as needed for nausea.  12 each  0  . rosuvastatin (CRESTOR) 40 MG tablet Take 40 mg by mouth daily.      . sucralfate (CARAFATE) 1 GM/10ML suspension Take 10 mLs (1 g total) by mouth 4 (four) times daily.  420 mL  5  . Tamsulosin HCl (FLOMAX) 0.4 MG CAPS Take 0.4 mg by mouth daily.       . TRANSDERM-SCOP 1.5 MG       . zolpidem (AMBIEN) 10 MG tablet Take 10 mg by mouth at bedtime.         OBJECTIVE: Middle-aged white male  in no acute distress Filed Vitals:   02/28/12 1343  BP: 116/69  Pulse: 71  Temp: 97.7 F (36.5 C)  Resp: 20     Body mass index is 29.36 kg/(m^2).    ECOG FS: 1 Filed Weights   02/28/12 1343  Weight: 222 lb 8.6 oz (100.943 kg)    (Weight was 237 lb .6 oz on 12/13/2011)  Sclerae unicteric Oropharynx clear, no thrush. Overall the oropharynx is fairly dry. I do not see ulcerations. No cervical or supraclavicular adenopathy; Lungs no rales or rhonchi Heart regular rate and rhythm Abd is generally soft, but when patient is an upright position, there is a hardened area, just above the umbilicus, consistent with a ventral hernia; no tenderness to palpation; positive bowel sounds; PEG in place, with no evidence of infection. MSK: No focal spinal  tenderness to palpation. Range of motion is significantly decreased in the right upper extremity. Neuro: Nonfocal, alert and oriented x3   LAB RESULTS: Lab Results  Component Value Date   WBC 4.3 02/28/2012    NEUTROABS 3.5 02/28/2012   HGB 13.0 02/28/2012   HCT 37.8* 02/28/2012   MCV 93.5 02/28/2012   PLT 139* 02/28/2012      Chemistry      Component Value Date/Time   NA 136 02/28/2012 1425   NA 138 01/03/2012 1338   K 3.8 02/28/2012 1425   K 4.1 01/03/2012 1338   CL 102 02/28/2012 1425   CL 100 01/03/2012 1338   CO2 25 02/28/2012 1425   CO2 27 01/03/2012 1338   BUN 15.0 02/28/2012 1425   BUN 12 01/03/2012 1338   CREATININE 0.8 02/28/2012 1425   CREATININE 0.81 01/03/2012 1338      Component Value Date/Time   CALCIUM 9.4 02/28/2012 1425   CALCIUM 9.5 01/03/2012 1338   ALKPHOS 58 02/28/2012 1425   ALKPHOS 62 12/13/2011 0950   AST 18 02/28/2012 1425   AST 18 12/13/2011 0950   ALT 21 02/28/2012 1425   ALT 13 12/13/2011 0950   BILITOT 0.80 02/28/2012 1425   BILITOT 0.4 12/13/2011 0950       STUDIES: Nm Pet Image Restag (ps) Skull Base To Thigh  02/24/2012  *RADIOLOGY REPORT*  Clinical Data: Subsequent treatment strategy for restaging of head neck primary.  NUCLEAR MEDICINE PET SKULL BASE TO THIGH  Fasting Blood Glucose:  114  Technique:  15.5 mCi F-18 FDG was injected intravenously. CT data was obtained and used for attenuation correction and anatomic localization only.  (This was not acquired as a diagnostic CT examination.) Additional exam technical data entered on technologist worksheet.  Comparison:  Abdominal CT of 09/23/2011.  Chest CT of 06/25/2004. No prior PET. The report of an outside PET from 09/03/2011 is reviewed.  Findings:  Neck: Multifocal oral pharyngeal and supraglottic laryngeal hypermetabolism without definite CT correlate.  Favored to be physiologic or treatment related.  There are left level II jugular chain nodes which measure up to 1.0 cm and a S.U.V. max of 3.1 on image 26 of series 2.  These are improved in size and hypermetabolism when compared to the outside report.  Chest:  No abnormal hypermetabolism.  Abdomen/Pelvis:  No abnormal hypermetabolism.  Skeleton:  No abnormal marrow activity  to suggest metastatic disease.  CT  images performed for attenuation correction demonstrate left maxillary sinus mucous retention cyst or polyp.  Partial opacification of left sided mastoid air cells.  Coronary artery atherosclerosis. Heart size upper normal. Gastrostomy tube appropriately positioned/repositioned since the prior CT.  Fat containing right inguinal hernia.  The right-sided urinary bladder extends towards the inguinal hernia and enter it on image 218.  Moderate prostatomegaly.  IMPRESSION: 1.  Given limitation of comparison to outside report of 08/20/2011, response to therapy.  Borderline enlarged left jugular chain nodes which demonstrate mild hypermetabolism. 2.  Multifocal mucosal hypermetabolism which is favored to be physiologic or treatment related.  No dominant residual mass identified. 3.  No evidence of extracervical disease.  4.  Right inguinal hernia containing fat and likely a portion of the urinary bladder.   Original Report Authenticated By: Consuello Bossier, M.D.      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 Ethelene Hal  PLAN:   This case was reviewed with Dr. Darnelle Catalan who also spoke with the patient today. He will continue to follow with Dr.  Ethelene Hal as planned, and we'll proceed with the nerve conduction study this weekend. The necks the upper, and Dr. Darnelle Catalan opinion, would be topursue an  appointment with a neurosurgeon. We will add Dilaudid back to [facility] pain regimen, 2 mg in the morning and 4 mg at bedtime in the hopes of better pain control and better sleep.  We are referring him back to Dr. Janee Morn as soon as possible for evaluation of a ventral hernia as well as an assessment for removal of PEG. He is already scheduled to meet with Dr. Basilio Cairo for followup soon, and we'll keep that appointment as scheduled. Otherwise, he will return to see Dr. Darnelle Catalan in approximately 2 weeks. He is also returning to the lab today. They know to call with any changes prior to his next scheduled appointment.  Acelyn Basham    02/28/2012

## 2012-02-28 NOTE — Telephone Encounter (Signed)
CD received from [facility].  Gave to Dr. Basilio Cairo.

## 2012-03-01 ENCOUNTER — Telehealth: Payer: Self-pay | Admitting: *Deleted

## 2012-03-01 NOTE — Telephone Encounter (Signed)
Gave patient appointment for 03-17-2012 at 9:00am with md  Called dr.thompson office 03-15-2012 is the earliest they can see the patient

## 2012-03-01 NOTE — Telephone Encounter (Signed)
No entry 

## 2012-03-02 ENCOUNTER — Telehealth: Payer: Self-pay | Admitting: *Deleted

## 2012-03-02 ENCOUNTER — Other Ambulatory Visit: Payer: Self-pay | Admitting: *Deleted

## 2012-03-02 NOTE — Telephone Encounter (Signed)
Gave medical records referral form and demographics with insurance on 03-02-2012 [facility]

## 2012-03-02 NOTE — Telephone Encounter (Signed)
03-15-2012 at 11:00am with dr.thompson

## 2012-03-03 ENCOUNTER — Ambulatory Visit
Admission: RE | Admit: 2012-03-03 | Discharge: 2012-03-03 | Disposition: A | Discharge status: Home / Self Care | Payer: Managed Care, Other (non HMO) | Source: Ambulatory Visit | Attending: Radiation Oncology | Admitting: Radiation Oncology

## 2012-03-03 ENCOUNTER — Encounter: Payer: Self-pay | Admitting: Radiation Oncology

## 2012-03-03 VITALS — BP 122/72 | HR 79 | Temp 97.8°F | Wt 223.9 lb

## 2012-03-03 DIAGNOSIS — C770 Secondary and unspecified malignant neoplasm of lymph nodes of head, face and neck: Secondary | ICD-10-CM

## 2012-03-03 NOTE — Progress Notes (Signed)
FU appt. Today.   States he is instilling 6-8 cans of Boost Plus via Peg Tube.  Carrying a glass of water due to xerostomia.  Remains on a soft diet and is still unable to eat meat.  Currently he has compression of C5 vetebra with numbness of right 3rd, 4th and 5th fingers.   Also has nerve pain in the axilla and base of right hand.  Numbness on the bottom of right foot.  Seen by Orthopedics but  Will be referred to a neurologist.  Now has a ventral hernia and an right. Inguinal.

## 2012-03-03 NOTE — Progress Notes (Signed)
Radiation Oncology         [phone] ________________________________  Name: Steven Robinson MRN: [id number]  Date: 03/03/2012  DOB: 1950/11/21  Follow-Up Visit Note  CC: Herb Grays, MD  Suzanna Obey, MD  Diagnosis:  [id number] Squamous cell carcinoma, head and neck, unknown primary . Presented with left neck nodes.  Interval Since Last Radiation:  Completed 70 Gy/35 fractions on 11-22-11  Narrative:  The patient returns today for routine follow-up.  He has multiple issues, unfortunately; but, his sense of humor amazingly remains.  1)  Dry mouth - sipping water frequently   2) ventral and inguinal hernia  3)   Developed right scapular pain 6 weeks after completion of RT.  This has evolved to radiate down right arm, with numbness of 4/5th digits.  Burning of portion of right palm.  Pain radiates across both breasts, above nipple line.  Now, some left arm pain, but not as bad as right. Right sole of foot hurts as well.  MRI of C-spine per report  Is + for severe right foraminal stenosis  And abutment of right C5 nerve root.  Minimal flattening of ventral cord at C5-6.  Indeterminate left neural foramen signal intensity. No cervical cord or intraspinal lesions. No acute fracture or worrisome bone lesions.   Dr. Ranell Patrick (orthopedic MD) is referring to [facility].                     4) Drinking nutritional shakes, not using PEG any more.  Not eating "regular" foods much, yet. Denies dysphagia or odynophagia. Weight stabilizing after titrating up on # of shakes/days   PET scan last week shows very low level hypermetabolic activity in left upper desk with 1 cm nodes.  Consensus at tumor board (which I attended) is to watch this with f/u scans/ exams.  ALLERGIES:  is allergic to cyclobenzaprine hcl.  Meds: Current Outpatient Prescriptions  Medication Sig Dispense Refill  . ALPRAZolam (XANAX) 0.5 MG tablet prn      . aspirin EC 81 MG tablet Take 81 mg by mouth daily.      [name] dexlansoprazole (DEXILANT)  60 MG capsule Take 60 mg by mouth daily.      [name] gabapentin (NEURONTIN) 300 MG capsule Take 1 capsule (300 mg total) by mouth at bedtime as needed.  30 capsule  6  . HYDROmorphone (DILAUDID) 2 MG tablet 1 PO QAM AND 2 PO QPM  90 tablet  0  . lactose free nutrition (BOOST PLUS) LIQD Take 237 mLs by mouth 3 (three) times daily with meals. 6-8 cans per day      . metaxalone (SKELAXIN) 800 MG tablet       . pilocarpine (SALAGEN) 5 MG tablet Take 1 tablet (5 mg total) by mouth 3 (three) times daily.  90 tablet  6  . rosuvastatin (CRESTOR) 40 MG tablet Take 40 mg by mouth daily.      . Tamsulosin HCl (FLOMAX) 0.4 MG CAPS Take 0.4 mg by mouth daily.       [name] zolpidem (AMBIEN) 10 MG tablet Take 10 mg by mouth at bedtime.       . diazepam (VALIUM) 10 MG tablet       . diphenhydrAMINE (BENADRYL) 25 MG tablet Take 25 mg by mouth at bedtime.       [name] HYDROcodone-acetaminophen (LORTAB) 7.5-500 MG/15ML solution Take 30 mLs by mouth every 4 (four) hours as needed for pain.  120 mL  0  . LORazepam (  ATIVAN) 0.5 MG tablet Take 0.5 mg by mouth every 6 (six) hours as needed.        Physical Findings: The patient is in no acute distress. Patient is alert and oriented.  weight is 223 lb 14.4 oz (101.56 kg). His temperature is 97.8 F (36.6 C). His blood pressure is 122/72 and his pulse is 79. .  No significant changes. HEENT: EOMI, dry mouth, no lesions Skin: healed well,  Neck: no lymphadenopathy palpable Musculoskeletal: good neck range of motion, no pain or paresthesias exacerbated by flexion Neuro: some modest weakness in 4/5th digits on right hand  Lab Findings: Lab Results  Component Value Date   WBC 4.3 02/28/2012   HGB 13.0 02/28/2012   HCT 37.8* 02/28/2012   MCV 93.5 02/28/2012   PLT 139* 02/28/2012    CMP     Component Value Date/Time   NA 136 02/28/2012 1425   NA 138 01/03/2012 1338   K 3.8 02/28/2012 1425   K 4.1 01/03/2012 1338   CL 102 02/28/2012 1425   CL 100 01/03/2012 1338   CO2 25 02/28/2012  1425   CO2 27 01/03/2012 1338   GLUCOSE 150* 02/28/2012 1425   GLUCOSE 98 01/03/2012 1338   BUN 15.0 02/28/2012 1425   BUN 12 01/03/2012 1338   CREATININE 0.8 02/28/2012 1425   CREATININE 0.81 01/03/2012 1338   CALCIUM 9.4 02/28/2012 1425   CALCIUM 9.5 01/03/2012 1338   PROT 6.7 02/28/2012 1425   PROT 6.8 12/13/2011 0950   ALBUMIN 4.1 02/28/2012 1425   ALBUMIN 3.5 12/13/2011 0950   AST 18 02/28/2012 1425   AST 18 12/13/2011 0950   ALT 21 02/28/2012 1425   ALT 13 12/13/2011 0950   ALKPHOS 58 02/28/2012 1425   ALKPHOS 62 12/13/2011 0950   BILITOT 0.80 02/28/2012 1425   BILITOT 0.4 12/13/2011 0950   GFRNONAA >90 09/24/2011 0450   GFRAA >90 09/24/2011 0450      Radiographic Findings: Nm Pet Image Restag (ps) Skull Base To Thigh  02/24/2012  *RADIOLOGY REPORT*  Clinical Data: Subsequent treatment strategy for restaging of head neck primary.  NUCLEAR MEDICINE PET SKULL BASE TO THIGH  Fasting Blood Glucose:  114  Technique:  15.5 mCi F-18 FDG was injected intravenously. CT data was obtained and used for attenuation correction and anatomic localization only.  (This was not acquired as a diagnostic CT examination.) Additional exam technical data entered on technologist worksheet.  Comparison:  Abdominal CT of 09/23/2011.  Chest CT of 06/25/2004. No prior PET. The report of an outside PET from 09/03/2011 is reviewed.  Findings:  Neck: Multifocal oral pharyngeal and supraglottic laryngeal hypermetabolism without definite CT correlate.  Favored to be physiologic or treatment related.  There are left level II jugular chain nodes which measure up to 1.0 cm and a S.U.V. max of 3.1 on image 26 of series 2.  These are improved in size and hypermetabolism when compared to the outside report.  Chest:  No abnormal hypermetabolism.  Abdomen/Pelvis:  No abnormal hypermetabolism.  Skeleton:  No abnormal marrow activity to suggest metastatic disease.  CT  images performed for attenuation correction demonstrate left maxillary sinus mucous  retention cyst or polyp.  Partial opacification of left sided mastoid air cells.  Coronary artery atherosclerosis. Heart size upper normal. Gastrostomy tube appropriately positioned/repositioned since the prior CT.  Fat containing right inguinal hernia.  The right-sided urinary bladder extends towards the inguinal hernia and enter it on image 218.  Moderate prostatomegaly.  IMPRESSION:  1.  Given limitation of comparison to outside report of 08/20/2011, response to therapy.  Borderline enlarged left jugular chain nodes which demonstrate mild hypermetabolism. 2.  Multifocal mucosal hypermetabolism which is favored to be physiologic or treatment related.  No dominant residual mass identified. 3.  No evidence of extracervical disease.  4.  Right inguinal hernia containing fat and likely a portion of the urinary bladder.   Original Report Authenticated By: Consuello Bossier, M.D.     Impression:  The patient is recovering from the effects of radiation.   Plan:   1) NSU to evaluate for pain/numbness above.  Does not seem consistent with L'hermitte's syndrome or rare radiation myelitis from RT.    2) Mr. Grundman will contact his surgeon to discuss PEG removal and hernia repair  3) will get a CT of neck with contrast and f/u with me in ~6 wks  4) PET in ~3 mo from today if CT is stable _____________________________________   Lonie Peak, MD

## 2012-03-06 ENCOUNTER — Telehealth (INDEPENDENT_AMBULATORY_CARE_PROVIDER_SITE_OTHER): Payer: Self-pay | Admitting: General Surgery

## 2012-03-06 ENCOUNTER — Telehealth: Payer: Self-pay | Admitting: *Deleted

## 2012-03-06 NOTE — Telephone Encounter (Signed)
CALLED PATIENT TO INFORM OF TEST AND FU VISIT, SPOKE WITH PATIENT AND HE IS AWARE OF THESE APPTS 

## 2012-03-06 NOTE — Telephone Encounter (Signed)
Pt has ventral and RIH; aslo has G-tube.  Had severe pain last night with inguinal hernia and the ventral hernia was bulging badly.  Discussed incarceration and how to try to reduce a hernia.  Advised go to ER if unable to reduce and pain remains severe.  Also placed on Wait List for cancellations (to come in sooner if possible.)

## 2012-03-13 ENCOUNTER — Other Ambulatory Visit: Payer: Self-pay | Admitting: Radiation Oncology

## 2012-03-13 ENCOUNTER — Other Ambulatory Visit: Payer: Self-pay | Admitting: *Deleted

## 2012-03-13 ENCOUNTER — Ambulatory Visit: Payer: Self-pay | Admitting: Oncology

## 2012-03-13 ENCOUNTER — Telehealth: Payer: Self-pay | Admitting: *Deleted

## 2012-03-13 DIAGNOSIS — G629 Polyneuropathy, unspecified: Secondary | ICD-10-CM

## 2012-03-13 MED ORDER — HYDROMORPHONE HCL 4 MG PO TABS: ORAL_TABLET | ORAL | Status: DC

## 2012-03-13 NOTE — Telephone Encounter (Addendum)
Steven Robinson called and reported that he was seen by Dr. Terrace Arabia at [facility] for the shooting pain, numbness and difficulty with using his right hand along with numbness in his right foot. which he reported to Dr. Basilio Cairo on 03/03/12.   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  An MRI was also obtained as ordered by Dr. Terrace Arabia.  He was placed on a Medrol dose pack and Lyrica to follow.  Dr. Basilio Cairo has reviewed the MRI and the notes from Dr. Terrace Arabia will be obtained once Mr. Steven Robinson signs a release form.  The form will be sent via e-mail to him.

## 2012-03-13 NOTE — Telephone Encounter (Signed)
Pt called this am to state he was seen by neurologist last week and has been diagnosed with radiation induced myelopathy.  Per Mellody Dance " neurologist states above is transient but may last 6-9 months" He was put on a medrol dose pak ( which he completed ) and Lyrica tid.  He is having ongoing pain despite above medications and is using diluadid. He is calling to inquire if he can increase dose- as well as to obtain a new script.  Per MD above appropriate, new script obtained.

## 2012-03-14 HISTORY — PX: PEG TUBE REMOVAL: SHX2187

## 2012-03-15 ENCOUNTER — Encounter (INDEPENDENT_AMBULATORY_CARE_PROVIDER_SITE_OTHER): Payer: Self-pay | Admitting: General Surgery

## 2012-03-15 ENCOUNTER — Ambulatory Visit (INDEPENDENT_AMBULATORY_CARE_PROVIDER_SITE_OTHER): Payer: Managed Care, Other (non HMO) | Admitting: General Surgery

## 2012-03-15 VITALS — BP 116/65 | HR 70 | Temp 97.7°F | Resp 18 | Ht 73.0 in | Wt 222.4 lb

## 2012-03-15 DIAGNOSIS — Z931 Gastrostomy status: Secondary | ICD-10-CM

## 2012-03-15 NOTE — Progress Notes (Signed)
Subjective:     Patient ID: Steven Robinson, male   DOB: 08/25/1950, 61 y.o.   MRN: [id number]  HPI Patient is status post open gastrostomy tube. He is completed treatment for head and neck cancer. He has a known right inguinal hernia. He is also developed an incisional hernia from his gastrostomy tube placement. He has not used his gastrostomy tube for a couple months. He is here for removal and discussion of treatment of his hernias.  Review of Systems     Objective:   Physical Exam  HENT:       Skin changes from radiation treatment for head and neck  Cardiovascular: Normal rate and regular rhythm.   Pulmonary/Chest: Effort normal. No respiratory distress. He has no wheezes.  Abdominal: Soft. He exhibits no distension. There is no tenderness. There is no rebound.       Gastrostomy tube with some granulation tissue at the site. Patient is a large incisional hernia involving most of his incision, it spontaneously reduces when he lays down. There is no tenderness. In addition, he has a reducible right inguinal hernia. It does not extend into the scrotum. No evidence of left angle hernia. Testes are descended and nonedematous.  Neurological:       Neuropathy right upper extremity   Gastrostomy tube site was prepped in sterile fashion. 1% lidocaine with epinephrine was injected around it. It was removed intact without difficulty. Silver nitrate was applied to the granulation tissue. A bulky gauze dressing was applied.    Assessment:     Gastrostomy tube no longer needed, incisional hernia, right inguinal hernia    Plan:     Gastrostomy tube was removed without difficulty. He tolerated this well. Wound instructions were given. I will see him back in one month to discuss further management of his hernias. We will plan laparoscopic repair of his incisional hernia with mesh. Biologic mesh may be required. Will also plan open repair of a spreading the hernia at that time. He will call with questions  in the interim.

## 2012-03-16 ENCOUNTER — Telehealth: Payer: Self-pay | Admitting: *Deleted

## 2012-03-17 ENCOUNTER — Ambulatory Visit: Payer: Self-pay | Admitting: Oncology

## 2012-03-23 ENCOUNTER — Telehealth: Payer: Self-pay | Admitting: *Deleted

## 2012-03-23 ENCOUNTER — Encounter (INDEPENDENT_AMBULATORY_CARE_PROVIDER_SITE_OTHER): Payer: Self-pay | Admitting: General Surgery

## 2012-03-23 ENCOUNTER — Ambulatory Visit (INDEPENDENT_AMBULATORY_CARE_PROVIDER_SITE_OTHER): Payer: Managed Care, Other (non HMO) | Admitting: General Surgery

## 2012-03-23 VITALS — BP 118/72 | HR 72 | Temp 97.6°F | Resp 16 | Ht 73.0 in | Wt 226.1 lb

## 2012-03-23 DIAGNOSIS — K4090 Unilateral inguinal hernia, without obstruction or gangrene, not specified as recurrent: Secondary | ICD-10-CM | POA: Insufficient documentation

## 2012-03-23 DIAGNOSIS — K432 Incisional hernia without obstruction or gangrene: Secondary | ICD-10-CM

## 2012-03-23 NOTE — Telephone Encounter (Signed)
Patient called reporting he had to see PCP today for the right inguinal hernia.  It came out twice.  He has to lie down and put it back in.  Today he is to see Dr. Abbey Chatters because Dr. Maisie Fus is not in.  Would like dr. Darnelle Catalan aware of this because he doesn't know if he is a candidate for surgery, being a cancer patient who's received chemotherapy and radiation.  Expressed his immnune system may not be able to undergo surgery for this possible tear.    Also reports he is now on Lyrica.  Has seen a neurologist for a lot of neuropathic pain.  "Neurologist believes I have radiation myelopathy".  Dr. Karoline Caldwell was the radiation provider does not agree.  Colburn has "no feeling to last two fingers on the right and my back hurts too".   Will notify providers.   This nurse expressed that last cisplatin was June 2013 and he is past the 7 to 14 day Nadir timeframe so he should be okay to have surgery from this perspective.

## 2012-03-23 NOTE — Progress Notes (Signed)
Subjective:     Patient ID: Steven Robinson, male   DOB: 01/02/1951, 61 y.o.   MRN: [id number]  HPI  He is here complaining of increased pain from his right inguinal hernia. He has increased his activities over the past week including throwing hay and walking his 85 pound dog.  This has led to the increased pain. There are no incarceration symptoms.   Review of Systems No nausea or vomiting. No difficulty starting the urinary stream.    Objective:   Physical Exam Gen.-overweight male in no acute distress.  Abdomen-upper midline scar with reducible hernia. Previous G-tube site has healed.  GU-reducible right inguinal hernia is present.    Assessment:     Increasing discomfort from his right inguinal hernia correlating with him increasing his activity. No evidence of incarceration. Both hernias are easily reducible.    Plan:     No strenuous activities and minimize lifting. I will communicate with Dr. Janee Morn regarding trying to see him sooner and get his surgery scheduled.

## 2012-03-23 NOTE — Patient Instructions (Addendum)
No lifting over 5 pounds.  No strenuous activity or manual labor.  I will communicate with Dr. Janee Morn about you.

## 2012-03-24 ENCOUNTER — Other Ambulatory Visit: Payer: Self-pay | Admitting: General Surgery

## 2012-03-24 ENCOUNTER — Telehealth (INDEPENDENT_AMBULATORY_CARE_PROVIDER_SITE_OTHER): Payer: Self-pay

## 2012-03-24 NOTE — Telephone Encounter (Signed)
Message copied by Ivory Broad on Fri Mar 24, 2012  2:10 PM ------      Message from: Delcie Roch      Created: Fri Mar 24, 2012  1:45 PM      Regarding: surgery      Contact: [phone]       Pt called to check on surgery scheduling.  He is a Dr. Janee Morn patient who saw Dr. Abbey Chatters yesterday.  Evidently, Dr. Janee Morn called the patient last night and left a message.  So, he is wondering if we will contact him, or does he have to call the schedulers.  I didn't see any orders.  Please check and contact patient. Thanks!

## 2012-03-24 NOTE — Telephone Encounter (Signed)
I called the pt to inform him that I have given his posting information to the schedulers and they will call him.

## 2012-03-27 ENCOUNTER — Telehealth (INDEPENDENT_AMBULATORY_CARE_PROVIDER_SITE_OTHER): Payer: Self-pay

## 2012-03-27 ENCOUNTER — Telehealth: Payer: Self-pay | Admitting: General Surgery

## 2012-03-27 NOTE — Telephone Encounter (Signed)
I was given a message that the patient is scheduled for surgery on 11/25 and he has questions.  I called him back.  He wanted to know what recovery time will be like.  He has having 2 hernias repaired.  I told him it will probably be 6 weeks with no heavy lifting over 10 lbs. I asked him not to do anything strenuous but walking is ok.  He has put off any flying until it is fixed.  He was hoping for a sooner date if possible.  I told him I would let Dr Janee Morn know in case there is something sooner to open up.

## 2012-03-27 NOTE — Telephone Encounter (Signed)
I called the patient to answer his questions regarding his upcoming surgery.[name]  He is also interested in moving the surgery sooner if possible.  I told him I will continue to look at the schedule to see if this is possible.

## 2012-03-30 ENCOUNTER — Ambulatory Visit (HOSPITAL_BASED_OUTPATIENT_CLINIC_OR_DEPARTMENT_OTHER): Payer: Managed Care, Other (non HMO) | Admitting: Oncology

## 2012-03-30 ENCOUNTER — Telehealth: Payer: Self-pay | Admitting: *Deleted

## 2012-03-30 VITALS — BP 105/70 | HR 62 | Temp 97.8°F | Resp 20 | Ht 73.0 in | Wt 222.8 lb

## 2012-03-30 DIAGNOSIS — E46 Unspecified protein-calorie malnutrition: Secondary | ICD-10-CM

## 2012-03-30 DIAGNOSIS — C109 Malignant neoplasm of oropharynx, unspecified: Secondary | ICD-10-CM

## 2012-03-30 DIAGNOSIS — C760 Malignant neoplasm of head, face and neck: Secondary | ICD-10-CM

## 2012-03-30 DIAGNOSIS — G9589 Other specified diseases of spinal cord: Secondary | ICD-10-CM

## 2012-03-30 MED ORDER — NAPROXEN 500 MG PO TABS: 500.0000 mg | ORAL_TABLET | Freq: Three times a day (TID) | ORAL | Status: DC

## 2012-03-30 MED ORDER — FENTANYL 25 MCG/HR TD PT72: 1.0000 | MEDICATED_PATCH | TRANSDERMAL | Status: DC

## 2012-03-30 NOTE — Telephone Encounter (Signed)
see berry early december, see GM late Jan, labs same day both visits; pet scan shortly befor Jan visit  Pet scan scheduled for 06-12-2012 arrival time 9:45am  Patient aware of the all appointments

## 2012-03-30 NOTE — Progress Notes (Signed)
ID: Steven Robinson   DOB: 17-Oct-1950  MR#: [id number]  JXB#:[id number]  PCP: Herb Grays, MD SU: Violeta Gelinas MD; Suzanna Obey MD OTHER MD: Lonie Peak, Levert Feinstein, Shirlean Kelly   HISTORY OF PRESENT ILLNESS: HPI: The patient noted an enlarging left neck mass early March 2013, and brought this to the attention of his PCP, Dr. Dewain Penning. She obtained a neck CT at [facility] 08/20/2011, which showed multiple enlarged nodes in the left mid-neck along the jugular v., the largest 2.0 cm. She referred the patient to Suzanna Obey in ENT and FNA of one of the enlarged nodes was positive for squamous cell carcinoma ([id number]). A p16 stain was read as equivocal. PET scan 09/03/2011 showed SUVs >6 in the left oropharynx and left jugular chain nodes; SUV of 5.49 was noted in the larynx. There was no evidence of contralateral or disseminated disease.  This was consistent with a clinical T1 N2,stage IVA left oropharyngeal SCCA. However multiple biopsies under Dr. Jearld Fenton 09/09/2011 [[id number]] were all negative. The pathologic staging therefore is TX N2, which impacts radiation planning.  The patient met with Drs. Cruzita Lederer, and IMRT with concurrent chemosensitization was planned. The patient had dental evaluation under Dr. Kristin Bruins, several extractions under Dr. Chales Salmon, had audiometry (showing mild high-frequency hearing loss) and met with nutrition. A PEG tube placed by IR unfortunately became dislodged and infected, and had to be replaced through an open procedure under Violeta Gelinas. This delayed the start of chemotherapy and caused a change in plan from the original CDDP at 100 mg/m2 Q3w to 40 mg/M2 weekly. Radiation was started 10/04/2011 and chemotherapy 10/25/2011. The patient's subsequent history is as detailed below.   INTERVAL HISTORY: Steven Robinson today with his wife [name] for followup of his squamous cell carcinoma of the oropharynx. He has seen in urology, and they feel radiation myelopathy is the working  diagnosis as far as his pain is concerned. He was started on Lyrica. His pain regimen is detailed below, but he was started also on a fentanyl 75 mg per hour patch and that proved to be a bit much, causing confusion and nausea.   REVIEW OF SYSTEMS: In addition he has a significant right inguinal hernia. This is being followed by Dr. Janee Morn, and the plan is to reduce it on of November 11 at the same time that his ventral hernia I will be repaired. Teeth is still relying chiefly on boost, and he has 8 bottles a day. He is however eating aches and other foods. Swallowing is not a problem although his mouth is still quite dry. It is difficult to get anything down that is not wet. The pain of course is chiefly the right hand particularly digits 2 through 4 all the way up the arm and then a band across the upper back and a band across the front. He has an appointment with Dr. Newell Coral for consideration of possible kyphoplasty if if impingement or of compression fractures in the cervical spine are contributing to his pain. Teeth is managing to have regular bowel movements thinks to MiraLAX and stool softeners. A detailed review of systems was otherwise stable.   PAST MEDICAL HISTORY: Past Medical History  Diagnosis Date  . Hyperlipoproteinemia   . Obstructive sleep apnea     on Robinson-pap.   [name] GERD (gastroesophageal reflux disease)     chronic  . Hx of colonic polyps   . 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 Vertigo  2/2 intersitial neuritis.   [name] Hearing loss of left ear   . BPH (benign prostatic hyperplasia)   . Hyperlipidemia   . Mucositis 11/01/2011  . S/P radiation therapy 10/04/11-11/22/11    Mucosal Axis/Neck:7000cGy/35Fractions  . Status post chemotherapy     CDDP During with Radiation therapy  . Gastrostomy tube dependent   . Oropharynx cancer 08/26/2011    PAST SURGICAL HISTORY: Past Surgical History  Procedure Date  . Fine needle aspiration 08/26/11     LEFT NECK FNA, LYMPH  NODE, METASTATIC SQUAMOUS CELL CARCINOMA  . Gastrostomy 09/24/2011    Procedure: GASTROSTOMY;  Surgeon: Liz Malady, MD;  Location: [facility] OR;  Service: General;  Laterality: N/A;  open G-tube placement procedure start @1219   . Rigid esophagoscopy 09/24/2011    Procedure: RIGID ESOPHAGOSCOPY;  Surgeon: Suzanna Obey, MD;  Location: [facility] OR;  Service: ENT;  Laterality: N/A;  Esophagoscopy procedure end @1210   . Hernia repair 1990    Left    FAMILY HISTORY Family History  Problem Relation Age of Onset  . Alzheimer's disease Mother   . Tics Mother   . Hypertension Father   . Cancer Father 11    Prostate  . Stroke Paternal Grandmother   . Cancer Paternal Grandfather     Lung  and Prostate   the patient's father is alive at age 3. He lives in a retirement community. The patient's mother died at the age of 61 in 11/04/09; she had significant Alzheimer's disease. The patient is an only child.  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 His wife Steven Robinson is a Music therapist in oil. The live on 8 acres and have 2 large horses, a donkey, a Engineer, manufacturing, chickens, etc. The patient's daughter Steven Robinson lives in [place] and is currently looking for a job. The patient's son Steven Robinson works for an Economist. The patient has 2 grandchildren, granddaughter age 31 and a grandson aged 8 months. He attends the [employer].  ADVANCED DIRECTIVES: in place  HEALTH MAINTENANCE: History  Substance Use Topics  . Smoking status: Former Smoker -- 0.2 packs/day for 3 years    Quit date: 06/14/1972  . Smokeless tobacco: Not on file  . Alcohol Use: No     Hx of Drinking wine - Stopped 3 years ago     Allergies  Allergen Reactions  . Cyclobenzaprine Hcl Other (See Comments)    confusion    Current Outpatient Prescriptions  Medication Sig Dispense Refill  . aspirin EC 81 MG tablet Take 81 mg by  mouth daily.      [name] dexlansoprazole (DEXILANT) 60 MG capsule Take 60 mg by mouth daily.      [name] gabapentin (NEURONTIN) 300 MG capsule Take 1 capsule (300 mg total) by mouth at bedtime as needed.  30 capsule  6  . HYDROmorphone (DILAUDID) 4 MG tablet May take 1-2 tabs q 4 hours prn pain  60 tablet  0  . lactose free nutrition (BOOST PLUS) LIQD Take 237 mLs by mouth 3 (three) times daily with meals. 6-8 cans per day      . metaxalone (SKELAXIN) 800 MG tablet       . naproxen (NAPROSYN) 500 MG tablet Take 500 mg by mouth 2 (two) times daily with a meal.      . pilocarpine (SALAGEN) 5 MG tablet Take 1 tablet (5 mg total) by mouth 3 (three) times daily.  90 tablet  6  . Pregabalin (LYRICA PO) Take by mouth.      . rosuvastatin (CRESTOR) 40 MG tablet Take 40 mg by mouth daily.      . Tamsulosin HCl (FLOMAX) 0.4 MG CAPS Take 0.4 mg by mouth daily.       [name] zolpidem (AMBIEN) 10 MG tablet Take 10 mg by mouth at bedtime.       [name] NASONEX 50 MCG/ACT nasal spray         OBJECTIVE: Middle-aged white male  in no acute distress Filed Vitals:   03/30/12 0939  BP: 105/70  Pulse: 62  Temp: 97.8 F (36.6 Robinson)  Resp: 20     Body mass index is 29.39 kg/(m^2).    ECOG FS: 1 Filed Weights   03/30/12 0939  Weight: 222 lb 12.8 oz (101.061 kg)    Sclerae unicteric Oropharynx clear, dry; no thrush. I do not see ulcerations. No cervical or supraclavicular adenopathy; Lungs no rales or rhonchi Heart regular rate and rhythm Abd soft, ventral hernia as before; PEG has been removed  MSK: No focal spinal tenderness to palpation. Range of motion is significantly decreased in the right upper extremity. Neuro: Nonfocal, alert and oriented x3; grip on the right is 5 minus, is 5 over 5 on the left.   LAB RESULTS: Lab Results  Component Value Date   WBC 4.3 02/28/2012   NEUTROABS 3.5 02/28/2012   HGB 13.0 02/28/2012   HCT 37.8* 02/28/2012   MCV 93.5 02/28/2012   PLT 139* 02/28/2012      Chemistry      Component  Value Date/Time   NA 136 02/28/2012 1425   NA 138 01/03/2012 1338   K 3.8 02/28/2012 1425   K 4.1 01/03/2012 1338   CL 102 02/28/2012 1425   CL 100 01/03/2012 1338   CO2 25 02/28/2012 1425   CO2 27 01/03/2012 1338   BUN 15.0 02/28/2012 1425   BUN 12 01/03/2012 1338   CREATININE 0.8 02/28/2012 1425   CREATININE 0.81 01/03/2012 1338      Component Value Date/Time   CALCIUM 9.4 02/28/2012 1425   CALCIUM 9.5 01/03/2012 1338   ALKPHOS 58 02/28/2012 1425   ALKPHOS 62 12/13/2011 0950   AST 18 02/28/2012 1425   AST 18 12/13/2011 0950   ALT 21 02/28/2012 1425   ALT 13 12/13/2011 0950   BILITOT 0.80 02/28/2012 1425   BILITOT 0.4 12/13/2011 0950       STUDIES: Nm Pet Image Restag (ps) Skull Base To Thigh  02/24/2012  *RADIOLOGY REPORT*  Clinical Data: Subsequent treatment strategy for restaging of head neck primary.  NUCLEAR MEDICINE PET SKULL BASE TO THIGH  Fasting Blood Glucose:  114  Technique:  15.5 mCi F-18 FDG was injected intravenously. CT data was obtained and used for attenuation correction and anatomic localization only.  (This was not acquired as a diagnostic CT examination.) Additional exam technical data entered on technologist worksheet.  Comparison:  Abdominal CT of 09/23/2011.  Chest CT of 06/25/2004. No prior PET. The report of an outside PET from 09/03/2011 is reviewed.  Findings:  Neck: Multifocal oral pharyngeal and supraglottic laryngeal hypermetabolism without definite CT correlate.  Favored to be physiologic or treatment related.  There are left level II jugular chain nodes which measure up to 1.0 cm and a S.U.V. max of 3.1 on image 26 of series 2.  These are improved in size and hypermetabolism when compared to the outside report.  Chest:  No abnormal hypermetabolism.  Abdomen/Pelvis:  No abnormal hypermetabolism.  Skeleton:  No abnormal marrow activity to suggest metastatic disease.  CT  images performed for attenuation correction demonstrate left maxillary sinus mucous retention cyst or polyp.   Partial opacification of left sided mastoid air cells.  Coronary artery atherosclerosis. Heart size upper normal. Gastrostomy tube appropriately positioned/repositioned since the prior CT.  Fat containing right inguinal hernia.  The right-sided urinary bladder extends towards the inguinal hernia and enter it on image 218.  Moderate prostatomegaly.  IMPRESSION: 1.  Given limitation of comparison to outside report of 08/20/2011, response to therapy.  Borderline enlarged left jugular chain nodes which demonstrate mild hypermetabolism. 2.  Multifocal mucosal hypermetabolism which is favored to be physiologic or treatment related.  No dominant residual mass identified. 3.  No evidence of extracervical disease.  4.  Right inguinal hernia containing fat and likely a portion of the urinary bladder.   Original Report Authenticated By: Consuello Bossier, M.D.    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 diagnosed by FNA of a left cervical node March 2013, treated with IMRT completed 11/22/2011 and concurrent weekly cis-platinum, last dose 11/23/2011  (1) risk factors are smoking history (minimal and remote), significant alcohol use (discontinued 2010), and an equivocal p16 probe  (2) dizzyness, possibly due partly to autonomic dysfunction from cis-platinum, improved  (3) malnutrition: He is maintaining his weight on his current diet  (4) dry mouth: Will continue pilocarpine,  frequent rinses, try artificial saliva  (5) at risk of hypothyroidism: TSH normal SEPT 2013  (6) right hand/arm/shoulder pain: 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  (7) right inguinal hernia/ventral hernia: Repair planned 04/24/2012  PLAN:   I am changing his pain regimen as follows: He will take Naprosyn 500 mg 3 times daily with food; he will continue to take diluted 4 mg at 2 PM and 8 mg at around 8 PM. In addition we are adding a fentanyl patch, 25 mcg per  hour, to be placed every 3 days. He will call us in about a week let us know how that is working. It may be too much, or he may need to go up to 50 mg. The goal is to get his pain to a 3-4 level (from the current 6-8 level)., Without interfering with his normal daily functions.  He a ready has appointments for scans and visit with Dr. Basilio Cairo, and she will return to see [place] again in early December and late January. Before the late January visit we will repeat a PET scan. He knows to call for any problems that may develop before then.  Steven Robinson    03/30/2012

## 2012-04-05 ENCOUNTER — Other Ambulatory Visit: Payer: Self-pay | Admitting: Emergency Medicine

## 2012-04-05 MED ORDER — HYDROMORPHONE HCL 4 MG PO TABS: ORAL_TABLET | ORAL | Status: DC

## 2012-04-11 ENCOUNTER — Encounter (HOSPITAL_COMMUNITY): Payer: Self-pay | Admitting: Pharmacy Technician

## 2012-04-12 ENCOUNTER — Ambulatory Visit (HOSPITAL_COMMUNITY)
Admission: RE | Admit: 2012-04-12 | Discharge: 2012-04-12 | Disposition: A | Discharge status: Home / Self Care | Payer: Managed Care, Other (non HMO) | Source: Ambulatory Visit | Attending: Radiation Oncology | Admitting: Radiation Oncology

## 2012-04-12 DIAGNOSIS — C801 Malignant (primary) neoplasm, unspecified: Secondary | ICD-10-CM | POA: Insufficient documentation

## 2012-04-12 DIAGNOSIS — R599 Enlarged lymph nodes, unspecified: Secondary | ICD-10-CM | POA: Insufficient documentation

## 2012-04-12 DIAGNOSIS — Z9221 Personal history of antineoplastic chemotherapy: Secondary | ICD-10-CM | POA: Insufficient documentation

## 2012-04-12 DIAGNOSIS — C770 Secondary and unspecified malignant neoplasm of lymph nodes of head, face and neck: Secondary | ICD-10-CM

## 2012-04-12 DIAGNOSIS — Z923 Personal history of irradiation: Secondary | ICD-10-CM | POA: Insufficient documentation

## 2012-04-12 MED ORDER — IOHEXOL 300 MG/ML  SOLN
100.0000 mL | Freq: Once | INTRAMUSCULAR | Status: AC | PRN
  Administered 2012-04-12: 100 mL via INTRAVENOUS

## 2012-04-13 ENCOUNTER — Telehealth: Payer: Self-pay | Admitting: *Deleted

## 2012-04-13 NOTE — Telephone Encounter (Signed)
No entry needed

## 2012-04-13 NOTE — Telephone Encounter (Signed)
PT. ALSO HAS NOTICED CHANGES IN HIS VOICE. FOR A WEEK PT. HAS HAD DIFFICULTY GETTING HIS BREATH PARTICULARLY AT NIGHT. THIS NOTE TO DR.MAGRINAT'S NURSE, VAL DODD,RN.

## 2012-04-14 ENCOUNTER — Ambulatory Visit
Admission: RE | Admit: 2012-04-14 | Discharge: 2012-04-14 | Disposition: A | Discharge status: Home / Self Care | Payer: Managed Care, Other (non HMO) | Source: Ambulatory Visit | Attending: Radiation Oncology | Admitting: Radiation Oncology

## 2012-04-14 ENCOUNTER — Encounter: Payer: Self-pay | Admitting: Radiation Oncology

## 2012-04-14 VITALS — BP 108/73 | HR 61 | Temp 97.8°F | Wt 227.6 lb

## 2012-04-14 DIAGNOSIS — C770 Secondary and unspecified malignant neoplasm of lymph nodes of head, face and neck: Secondary | ICD-10-CM

## 2012-04-14 NOTE — Progress Notes (Addendum)
Radiation Oncology         [phone]) [phone] ________________________________  Name: Steven Robinson MRN: [id number]  Date: 04/14/2012  DOB: 11-Sep-1950  Follow-Up Visit Note  Diagnosis:   [id number] Squamous cell carcinoma, head and neck, unknown primary . Presented with left neck nodes.  Interval Since Last Radiation:  Completed 70 Gy/35 fractions on 11-22-11 with concurrent cis-platinum  Narrative:  The patient returns today for routine follow-up. His symptoms are improving. However, he still has significant weakness in the fourth and fifth digit of his right hand and overall weakness in his right hand. He reports that his upper chest is still tender but improved. He is a little bit of pain in the upper back now. His right foot is still a little bit numb but this is extremely faint and much improved. His hand is the most uncomfortable part of his body, on the right side. However it is not really so much painful as it is uncomfortable. He reports that the numbness in his hand has improved significantly. He is on fentanyl 50 mcg as well as dilaudid, Lyrica, and lidocaine patches.  He saw neurosurgery earlier today and the working diagnosis is "radiation myelopathy."  No changes in management suggested.  The patient's other complaint is some firmness in his anterior neck tissues.  CT scan of his neck tissues performed 2 days ago was reviewed viewed with the patient and his wife. It shows a persistent lymph node aggregate (at least 2 notes) in the left upper neck that is about 2.7 cm in greatest dimension axially; this is stable to mildly improved from his previous scan but significantly improved from his original scan done in [place] when it was > 4-1/2 cm in greatest dimension axially. No signs of tumor in the mucosal axis or contralateral neck.  Inguinal and ventral hernia surgery scheduled for later this month.                            ALLERGIES:  is allergic to cyclobenzaprine  hcl.  Meds: Current Outpatient Prescriptions  Medication Sig Dispense Refill  . aspirin EC 81 MG tablet Take 81 mg by mouth daily.      [name] dexlansoprazole (DEXILANT) 60 MG capsule Take 60 mg by mouth daily.      [name] docusate sodium (COLACE) 100 MG capsule Take 100 mg by mouth 3 (three) times daily.      . fentaNYL (DURAGESIC - DOSED MCG/HR) 50 MCG/HR Place 1 patch onto the skin every 3 (three) days.      [name] HYDROmorphone (DILAUDID) 4 MG tablet Take 4-8 mg by mouth every 4 (four) hours as needed. For pain      . lactose free nutrition (BOOST PLUS) LIQD Take 237 mLs by mouth 3 (three) times daily with meals. 6-8 cans per day      . metaxalone (SKELAXIN) 800 MG tablet Take 800 mg by mouth 3 (three) times daily.       . naproxen (NAPROSYN) 500 MG tablet Take 1 tablet (500 mg total) by mouth 3 (three) times daily with meals.  90 tablet  6  . NASONEX 50 MCG/ACT nasal spray Place 2 sprays into the nose daily as needed.       . pilocarpine (SALAGEN) 5 MG tablet Take 1 tablet (5 mg total) by mouth 3 (three) times daily.  90 tablet  6  . polyethylene glycol (MIRALAX / GLYCOLAX) packet Take 17 g by mouth  2 (two) times daily.      . pregabalin (LYRICA) 100 MG capsule Take 100 mg by mouth 3 (three) times daily.      . rosuvastatin (CRESTOR) 40 MG tablet Take 40 mg by mouth daily.      . Tamsulosin HCl (FLOMAX) 0.4 MG CAPS Take 0.4 mg by mouth daily.       [name] zolpidem (AMBIEN) 10 MG tablet Take 10 mg by mouth at bedtime.         Physical Findings: The patient is in no acute distress. Patient is alert and oriented.  weight is 227 lb 9.6 oz (103.239 kg). His temperature is 97.8 F (36.6 C). His blood pressure is 108/73 and his pulse is 61. .  No significant changes. Neck exam is stable. I believe I may appreciate some fullness in the left level II region consistent with the findings on the CT scan - stable. He has some early subcutaneous edema along the midline of his  Neck. Oropharynx is fairly dry with no  mucositis or signs of tumor.  Lab Findings: Lab Results  Component Value Date   WBC 4.3 02/28/2012   HGB 13.0 02/28/2012   HCT 37.8* 02/28/2012   MCV 93.5 02/28/2012   PLT 139* 02/28/2012    CMP     Component Value Date/Time   NA 136 02/28/2012 1425   NA 138 01/03/2012 1338   K 3.8 02/28/2012 1425   K 4.1 01/03/2012 1338   CL 102 02/28/2012 1425   CL 100 01/03/2012 1338   CO2 25 02/28/2012 1425   CO2 27 01/03/2012 1338   GLUCOSE 150* 02/28/2012 1425   GLUCOSE 98 01/03/2012 1338   BUN 15.0 02/28/2012 1425   BUN 12 01/03/2012 1338   CREATININE 0.8 02/28/2012 1425   CREATININE 0.81 01/03/2012 1338   CALCIUM 9.4 02/28/2012 1425   CALCIUM 9.5 01/03/2012 1338   PROT 6.7 02/28/2012 1425   PROT 6.8 12/13/2011 0950   ALBUMIN 4.1 02/28/2012 1425   ALBUMIN 3.5 12/13/2011 0950   AST 18 02/28/2012 1425   AST 18 12/13/2011 0950   ALT 21 02/28/2012 1425   ALT 13 12/13/2011 0950   ALKPHOS 58 02/28/2012 1425   ALKPHOS 62 12/13/2011 0950   BILITOT 0.80 02/28/2012 1425   BILITOT 0.4 12/13/2011 0950   GFRNONAA >90 09/24/2011 0450   GFRAA >90 09/24/2011 0450      Radiographic Findings: Ct Soft Tissue Neck W Contrast  04/12/2012  *RADIOLOGY REPORT*  Clinical Data: 61 year old male with history of head and neck squamous cell carcinoma.  Chemotherapy and radiation completed.  CT NECK WITH CONTRAST  Technique:  Multidetector CT imaging of the neck was performed with intravenous contrast.  Contrast: OMNIPAQUE IOHEXOL 300 MG/ML  SOLN  Comparison: PET CT 02/24/2012.  Findings: Negative lung apices.  No superior mediastinal lymphadenopathy.  Negative thyroid.  Mild generalized pharyngeal mucosal edema.  Mild asymmetry of the laryngeal ventricle, larger on the right, otherwise negative larynx.  No discrete pharyngeal mass identified.  Trace retropharyngeal effusion at the level of the hyoid bone.  Anterior neck and face subcutaneous stranding.  Diminutive submandibular glands.  Parapharyngeal and sublingual spaces are within  normal limits.  Elongated left level IIA lymph node tissue measuring 27 x 9 x 13 mm is mildly spiculated.  This appears stable to mildly decreased since September.  This may represent several adjacent nodes. Small right level IIA node is stable.  No other cervical lymphadenopathy.  Major vascular structures in  the neck are patent.  Negative visualized brain parenchyma. Visualized orbit soft tissues are within normal limits.  Minor left mastoid fluid and maxillary sinus mucosal thickening.  Degenerative changes in the cervical spine. No acute osseous abnormality identified.  IMPRESSION: 1.  Post XRT sequelae.  No discrete pharyngeal mass. 2.  Residual left level IIA lymphadenopathy (9 x 27 x 13 mm) stable to mildly diminished since September.   Original Report Authenticated By: Harley Hallmark, M.D.     Impression:  The patient is recovering from the effects of radiation.   Plan:  We'll continue to follow closely. I will obtain a PET scan in 6 weeks with a close follow/up thereafter. We'll try to arrange followup in otolaryngology right after his PET scan as well so that we can have a consensus on further management. The patient is comfortable continuing to follow his neck and he understands that there is a good likelihood that the residual mass is scar tissue; however, if the are signs of progression or hypermetabolic activity he may undergo a neck dissection in the future.  The patient will continue his management for his neurologic symptoms as above. He is feeling better.  Will refer to physical therapy for subcutaneous neck edema.  A total of 45 minutes spent face-to-face with the patient today _____________________________________   Lonie Peak, MD

## 2012-04-14 NOTE — Progress Notes (Signed)
.    Since last visit Steven Robinson was seen by Dr. Jule Ser, Dr. Ashok Croon and Dr. Manon Hilding  And he has been diagnosed him with Radiation Myelopathy Grades pain in right hand and right elbow as a level 6-7.  His numbness has decreased in right hand and arm, but his right hand is so weak that he has difficulty holding a fork, starting a car, and opening drinks.   CT scan obtained yesterday because he discovered "hard lumps" in the neck but his scan was negative.  States his swallowing is better, but his appetite is poor.  He is to have surgery for his ventral and inguinal hernia on November 11th.

## 2012-04-17 ENCOUNTER — Other Ambulatory Visit: Payer: Self-pay | Admitting: Radiation Oncology

## 2012-04-21 ENCOUNTER — Encounter (HOSPITAL_COMMUNITY): Payer: Self-pay

## 2012-04-21 ENCOUNTER — Encounter (HOSPITAL_COMMUNITY)
Admission: RE | Admit: 2012-04-21 | Discharge: 2012-04-21 | Disposition: A | Discharge status: Home / Self Care | Payer: Managed Care, Other (non HMO) | Source: Ambulatory Visit | Attending: General Surgery | Admitting: General Surgery

## 2012-04-21 HISTORY — DX: Personal history of other medical treatment: Z92.89

## 2012-04-21 HISTORY — DX: Myoneural disorder, unspecified: G70.9

## 2012-04-21 LAB — CBC
MCH: 31.6 pg (ref 26.0–34.0)
MCHC: 33.9 g/dL (ref 30.0–36.0)
MCV: 93 fL (ref 78.0–100.0)
Platelets: 132 10*3/uL — ABNORMAL LOW (ref 150–400)
RBC: 4.12 MIL/uL — ABNORMAL LOW (ref 4.22–5.81)
RDW: 12.6 % (ref 11.5–15.5)

## 2012-04-21 LAB — BASIC METABOLIC PANEL
BUN: 17 mg/dL (ref 6–23)
CO2: 30 mEq/L (ref 19–32)
Calcium: 9.5 mg/dL (ref 8.4–10.5)
Creatinine, Ser: 0.73 mg/dL (ref 0.50–1.35)
GFR calc non Af Amer: >90 mL/min (ref >90)
Glucose, Bld: 90 mg/dL (ref 70–99)
Sodium: 137 mEq/L (ref 135–145)

## 2012-04-21 NOTE — Pre-Procedure Instructions (Signed)
21 Steven Robinson  04/21/2012   Your procedure is scheduled on:  Monday April 24, 2012  Report to [facility] at 11:00 AM.  Call this number if you have problems the morning of surgery: [phone]   Remember:   Do not eat food or drink :After Midnight.      Take these medicines the morning of surgery with A SIP OF WATER:  fentanyl, dilaudid, skelaxin, nasonex, lyrica    Do not wear jewelry  Do not wear lotions, powders, or perfumes.             Men may shave face and neck.  Do not bring valuables to the hospital.  Contacts, dentures or bridgework may not be worn into surgery.  Leave suitcase in the car. After surgery it may be brought to your room.  For patients admitted to the hospital, checkout time is 11:00 AM the day of discharge.   Patients discharged the day of surgery will not be allowed to drive home.  Name and phone number of your driver: family / friend  Special Instructions: Shower using CHG 2 nights before surgery and the night before surgery.  If you shower the day of surgery use CHG.  Use special wash - you have one bottle of CHG for all showers.  You should use approximately 1/3 of the bottle for each shower.   Please read over the following fact sheets that you were given: Pain Booklet, Coughing and Deep Breathing, MRSA Information and Surgical Site Infection Prevention

## 2012-04-23 MED ORDER — CEFAZOLIN SODIUM-DEXTROSE 2-3 GM-% IV SOLR
2.0000 g | INTRAVENOUS | Status: AC
  Administered 2012-04-24: 2 g via INTRAVENOUS
  Filled 2012-04-23: qty 50

## 2012-04-23 MED ORDER — CHLORHEXIDINE GLUCONATE 4 % EX LIQD: 1.0000 "application " | Freq: Once | CUTANEOUS | Status: DC

## 2012-04-24 ENCOUNTER — Ambulatory Visit (HOSPITAL_COMMUNITY): Payer: Managed Care, Other (non HMO) | Admitting: Anesthesiology

## 2012-04-24 ENCOUNTER — Encounter (HOSPITAL_COMMUNITY): Payer: Self-pay | Admitting: Anesthesiology

## 2012-04-24 ENCOUNTER — Encounter (HOSPITAL_COMMUNITY): Payer: Self-pay | Admitting: *Deleted

## 2012-04-24 ENCOUNTER — Encounter (HOSPITAL_COMMUNITY)
Admission: RE | Disposition: A | Discharge status: Home / Self Care | Payer: Self-pay | Source: Ambulatory Visit | Attending: General Surgery

## 2012-04-24 ENCOUNTER — Observation Stay (HOSPITAL_COMMUNITY)
Admission: RE | Admit: 2012-04-24 | Discharge: 2012-04-25 | Disposition: A | Discharge status: Home / Self Care | Payer: Managed Care, Other (non HMO) | Source: Ambulatory Visit | Attending: General Surgery | Admitting: General Surgery

## 2012-04-24 DIAGNOSIS — K432 Incisional hernia without obstruction or gangrene: Secondary | ICD-10-CM

## 2012-04-24 DIAGNOSIS — K219 Gastro-esophageal reflux disease without esophagitis: Secondary | ICD-10-CM | POA: Insufficient documentation

## 2012-04-24 DIAGNOSIS — C760 Malignant neoplasm of head, face and neck: Secondary | ICD-10-CM | POA: Insufficient documentation

## 2012-04-24 DIAGNOSIS — K4090 Unilateral inguinal hernia, without obstruction or gangrene, not specified as recurrent: Secondary | ICD-10-CM | POA: Insufficient documentation

## 2012-04-24 DIAGNOSIS — Z01812 Encounter for preprocedural laboratory examination: Secondary | ICD-10-CM | POA: Insufficient documentation

## 2012-04-24 DIAGNOSIS — C109 Malignant neoplasm of oropharynx, unspecified: Secondary | ICD-10-CM

## 2012-04-24 HISTORY — PX: VENTRAL HERNIA REPAIR: SHX424

## 2012-04-24 HISTORY — PX: INSERTION OF MESH: SHX5868

## 2012-04-24 HISTORY — PX: INGUINAL HERNIA REPAIR: SHX194

## 2012-04-24 SURGERY — REPAIR, HERNIA, VENTRAL, LAPAROSCOPIC
Anesthesia: General | Site: Groin | Laterality: Right | Wound class: Clean

## 2012-04-24 MED ORDER — ONDANSETRON HCL 4 MG/2ML IJ SOLN
INTRAMUSCULAR | Status: DC | PRN
  Administered 2012-04-24: 4 mg via INTRAVENOUS

## 2012-04-24 MED ORDER — ALBUMIN HUMAN 5 % IV SOLN
INTRAVENOUS | Status: DC | PRN
  Administered 2012-04-24: 14:00:00 via INTRAVENOUS

## 2012-04-24 MED ORDER — ONDANSETRON HCL 4 MG/2ML IJ SOLN: 4.0000 mg | Freq: Four times a day (QID) | INTRAMUSCULAR | Status: DC | PRN

## 2012-04-24 MED ORDER — HYDROMORPHONE HCL PF 1 MG/ML IJ SOLN
0.2500 mg | INTRAMUSCULAR | Status: DC | PRN
  Administered 2012-04-24 (×3): 0.5 mg via INTRAVENOUS

## 2012-04-24 MED ORDER — LACTATED RINGERS IV SOLN
INTRAVENOUS | Status: DC
  Administered 2012-04-24: 50 mL/h via INTRAVENOUS

## 2012-04-24 MED ORDER — ONDANSETRON HCL 4 MG PO TABS: 4.0000 mg | ORAL_TABLET | Freq: Four times a day (QID) | ORAL | Status: DC | PRN

## 2012-04-24 MED ORDER — VECURONIUM BROMIDE 10 MG IV SOLR
INTRAVENOUS | Status: DC | PRN
  Administered 2012-04-24 (×3): 1 mg via INTRAVENOUS

## 2012-04-24 MED ORDER — PHENYLEPHRINE HCL 10 MG/ML IJ SOLN
INTRAMUSCULAR | Status: DC | PRN
  Administered 2012-04-24 (×2): 40 ug via INTRAVENOUS
  Administered 2012-04-24: 80 ug via INTRAVENOUS

## 2012-04-24 MED ORDER — 0.9 % SODIUM CHLORIDE (POUR BTL) OPTIME
TOPICAL | Status: DC | PRN
  Administered 2012-04-24: 1000 mL

## 2012-04-24 MED ORDER — BUPIVACAINE-EPINEPHRINE 0.25% -1:200000 IJ SOLN
INTRAMUSCULAR | Status: DC | PRN
  Administered 2012-04-24: 14 mL

## 2012-04-24 MED ORDER — GLYCOPYRROLATE 0.2 MG/ML IJ SOLN
INTRAMUSCULAR | Status: DC | PRN
  Administered 2012-04-24: .6 mg via INTRAVENOUS

## 2012-04-24 MED ORDER — NEOSTIGMINE METHYLSULFATE 1 MG/ML IJ SOLN
INTRAMUSCULAR | Status: DC | PRN
  Administered 2012-04-24: 5 mg via INTRAVENOUS

## 2012-04-24 MED ORDER — MIDAZOLAM HCL 5 MG/5ML IJ SOLN
INTRAMUSCULAR | Status: DC | PRN
  Administered 2012-04-24: 2 mg via INTRAVENOUS

## 2012-04-24 MED ORDER — ROCURONIUM BROMIDE 100 MG/10ML IV SOLN
INTRAVENOUS | Status: DC | PRN
  Administered 2012-04-24: 50 mg via INTRAVENOUS

## 2012-04-24 MED ORDER — ARTIFICIAL TEARS OP OINT
TOPICAL_OINTMENT | OPHTHALMIC | Status: DC | PRN
  Administered 2012-04-24: 1 via OPHTHALMIC

## 2012-04-24 MED ORDER — HYDROMORPHONE HCL 2 MG PO TABS
4.0000 mg | ORAL_TABLET | ORAL | Status: DC | PRN
  Administered 2012-04-24 – 2012-04-25 (×4): 8 mg via ORAL
  Filled 2012-04-24 (×4): qty 4

## 2012-04-24 MED ORDER — KCL IN DEXTROSE-NACL 20-5-0.45 MEQ/L-%-% IV SOLN
INTRAVENOUS | Status: DC
  Administered 2012-04-24: 23:00:00 via INTRAVENOUS
  Filled 2012-04-24 (×4): qty 1000

## 2012-04-24 MED ORDER — PILOCARPINE HCL 5 MG PO TABS
5.0000 mg | ORAL_TABLET | Freq: Three times a day (TID) | ORAL | Status: DC
  Administered 2012-04-24 – 2012-04-25 (×2): 5 mg via ORAL
  Filled 2012-04-24 (×5): qty 1

## 2012-04-24 MED ORDER — PREGABALIN 50 MG PO CAPS
100.0000 mg | ORAL_CAPSULE | Freq: Three times a day (TID) | ORAL | Status: DC
  Administered 2012-04-24 – 2012-04-25 (×3): 100 mg via ORAL
  Filled 2012-04-24 (×3): qty 2

## 2012-04-24 MED ORDER — FENTANYL 50 MCG/HR TD PT72
50.0000 ug | MEDICATED_PATCH | TRANSDERMAL | Status: DC
  Administered 2012-04-24: 50 ug via TRANSDERMAL
  Filled 2012-04-24: qty 1

## 2012-04-24 MED ORDER — ENOXAPARIN SODIUM 40 MG/0.4ML ~~LOC~~ SOLN
40.0000 mg | SUBCUTANEOUS | Status: DC
  Administered 2012-04-25: 40 mg via SUBCUTANEOUS
  Filled 2012-04-24 (×2): qty 0.4

## 2012-04-24 MED ORDER — BUPIVACAINE-EPINEPHRINE PF 0.25-1:200000 % IJ SOLN
INTRAMUSCULAR | Status: AC
  Filled 2012-04-24: qty 30

## 2012-04-24 MED ORDER — METAXALONE 800 MG PO TABS
800.0000 mg | ORAL_TABLET | Freq: Three times a day (TID) | ORAL | Status: DC
  Administered 2012-04-24 – 2012-04-25 (×3): 800 mg via ORAL
  Filled 2012-04-24 (×5): qty 1

## 2012-04-24 MED ORDER — HYDROMORPHONE HCL PF 1 MG/ML IJ SOLN
1.0000 mg | INTRAMUSCULAR | Status: DC | PRN
  Administered 2012-04-24: 2 mg via INTRAVENOUS
  Filled 2012-04-24: qty 2

## 2012-04-24 MED ORDER — HYDROMORPHONE HCL PF 1 MG/ML IJ SOLN
INTRAMUSCULAR | Status: AC
  Filled 2012-04-24: qty 1

## 2012-04-24 MED ORDER — TAMSULOSIN HCL 0.4 MG PO CAPS
0.4000 mg | ORAL_CAPSULE | Freq: Every day | ORAL | Status: DC
  Administered 2012-04-24: 0.4 mg via ORAL
  Filled 2012-04-24 (×2): qty 1

## 2012-04-24 MED ORDER — LIDOCAINE HCL (CARDIAC) 20 MG/ML IV SOLN
INTRAVENOUS | Status: DC | PRN
  Administered 2012-04-24: 60 mg via INTRAVENOUS

## 2012-04-24 MED ORDER — OXYCODONE HCL 5 MG PO TABS: 5.0000 mg | ORAL_TABLET | Freq: Once | ORAL | Status: DC | PRN

## 2012-04-24 MED ORDER — EPHEDRINE SULFATE 50 MG/ML IJ SOLN
INTRAMUSCULAR | Status: DC | PRN
  Administered 2012-04-24 (×5): 5 mg via INTRAVENOUS

## 2012-04-24 MED ORDER — LACTATED RINGERS IV SOLN
INTRAVENOUS | Status: DC | PRN
  Administered 2012-04-24 (×2): via INTRAVENOUS

## 2012-04-24 MED ORDER — PROPOFOL 10 MG/ML IV BOLUS
INTRAVENOUS | Status: DC | PRN
  Administered 2012-04-24: 200 mg via INTRAVENOUS

## 2012-04-24 MED ORDER — ZOLPIDEM TARTRATE 5 MG PO TABS
10.0000 mg | ORAL_TABLET | Freq: Every day | ORAL | Status: DC
  Administered 2012-04-24: 10 mg via ORAL
  Filled 2012-04-24: qty 2

## 2012-04-24 MED ORDER — FENTANYL CITRATE 0.05 MG/ML IJ SOLN
INTRAMUSCULAR | Status: DC | PRN
  Administered 2012-04-24 (×3): 50 ug via INTRAVENOUS
  Administered 2012-04-24: 100 ug via INTRAVENOUS

## 2012-04-24 MED ORDER — DOCUSATE SODIUM 100 MG PO CAPS
100.0000 mg | ORAL_CAPSULE | Freq: Three times a day (TID) | ORAL | Status: DC
  Administered 2012-04-24 – 2012-04-25 (×3): 100 mg via ORAL
  Filled 2012-04-24 (×3): qty 1

## 2012-04-24 MED ORDER — NAPROXEN 500 MG PO TABS
500.0000 mg | ORAL_TABLET | Freq: Three times a day (TID) | ORAL | Status: DC
  Administered 2012-04-24 – 2012-04-25 (×3): 500 mg via ORAL
  Filled 2012-04-24 (×5): qty 1

## 2012-04-24 MED ORDER — OXYCODONE HCL 5 MG/5ML PO SOLN: 5.0000 mg | Freq: Once | ORAL | Status: DC | PRN

## 2012-04-24 SURGICAL SUPPLY — 83 items
ADH SKN CLS APL DERMABOND .7 (GAUZE/BANDAGES/DRESSINGS) ×4
APPLIER CLIP 5 13 M/L LIGAMAX5 (MISCELLANEOUS)
APPLIER CLIP ROT 10 11.4 M/L (STAPLE)
APR CLP MED LRG 11.4X10 (STAPLE)
APR CLP MED LRG 5 ANG JAW (MISCELLANEOUS)
BLADE SURG 10 STRL SS (BLADE) ×3 IMPLANT
BLADE SURG 15 STRL LF DISP TIS (BLADE) ×2 IMPLANT
BLADE SURG 15 STRL SS (BLADE) ×3
BLADE SURG ROTATE 9660 (MISCELLANEOUS) IMPLANT
CANISTER SUCTION 2500CC (MISCELLANEOUS) ×3 IMPLANT
CHLORAPREP W/TINT 26ML (MISCELLANEOUS) ×3 IMPLANT
CLIP APPLIE 5 13 M/L LIGAMAX5 (MISCELLANEOUS) IMPLANT
CLIP APPLIE ROT 10 11.4 M/L (STAPLE) IMPLANT
CLOTH BEACON ORANGE TIMEOUT ST (SAFETY) ×3 IMPLANT
COVER SURGICAL LIGHT HANDLE (MISCELLANEOUS) ×3 IMPLANT
DECANTER SPIKE VIAL GLASS SM (MISCELLANEOUS) ×2 IMPLANT
DERMABOND ADVANCED (GAUZE/BANDAGES/DRESSINGS) ×2
DERMABOND ADVANCED .7 DNX12 (GAUZE/BANDAGES/DRESSINGS) ×2 IMPLANT
DEVICE SECURE STRAP 25 ABSORB (INSTRUMENTS) ×6 IMPLANT
DEVICE TROCAR PUNCTURE CLOSURE (ENDOMECHANICALS) ×3 IMPLANT
DRAIN PENROSE 1/2X12 LTX STRL (WOUND CARE) ×1 IMPLANT
DRAPE PED LAPAROTOMY (DRAPES) ×2 IMPLANT
DRAPE UTILITY 15X26 W/TAPE STR (DRAPE) ×7 IMPLANT
ELECT CAUTERY BLADE 6.4 (BLADE) ×3 IMPLANT
ELECT REM PT RETURN 9FT ADLT (ELECTROSURGICAL) ×3
ELECTRODE REM PT RTRN 9FT ADLT (ELECTROSURGICAL) ×2 IMPLANT
FILTER SMOKE EVAC LAPAROSHD (FILTER) IMPLANT
GLOVE BIO SURGEON STRL SZ 6.5 (GLOVE) ×1 IMPLANT
GLOVE BIO SURGEON STRL SZ7.5 (GLOVE) ×1 IMPLANT
GLOVE BIO SURGEON STRL SZ8 (GLOVE) ×4 IMPLANT
GLOVE BIOGEL PI IND STRL 7.0 (GLOVE) IMPLANT
GLOVE BIOGEL PI IND STRL 7.5 (GLOVE) IMPLANT
GLOVE BIOGEL PI IND STRL 8 (GLOVE) ×2 IMPLANT
GLOVE BIOGEL PI INDICATOR 7.0 (GLOVE) ×2
GLOVE BIOGEL PI INDICATOR 7.5 (GLOVE) ×1
GLOVE BIOGEL PI INDICATOR 8 (GLOVE) ×1
GLOVE ECLIPSE 6.5 STRL STRAW (GLOVE) ×1 IMPLANT
GOWN PREVENTION PLUS XLARGE (GOWN DISPOSABLE) ×3 IMPLANT
GOWN PREVENTION PLUS XXLARGE (GOWN DISPOSABLE) ×1 IMPLANT
GOWN STRL NON-REIN LRG LVL3 (GOWN DISPOSABLE) ×6 IMPLANT
KIT BASIN OR (CUSTOM PROCEDURE TRAY) ×3 IMPLANT
KIT ROOM TURNOVER OR (KITS) ×3 IMPLANT
MESH HERNIA 3X6 (Mesh General) ×1 IMPLANT
MESH PHYSIO OVAL 20X25CM (Mesh General) ×1 IMPLANT
NDL HYPO 25GX1X1/2 BEV (NEEDLE) ×2 IMPLANT
NDL SPNL 22GX3.5 QUINCKE BK (NEEDLE) ×2 IMPLANT
NEEDLE HYPO 25GX1X1/2 BEV (NEEDLE) ×3 IMPLANT
NEEDLE SPNL 22GX3.5 QUINCKE BK (NEEDLE) ×3 IMPLANT
NS IRRIG 1000ML POUR BTL (IV SOLUTION) ×3 IMPLANT
PACK SURGICAL SETUP 50X90 (CUSTOM PROCEDURE TRAY) ×2 IMPLANT
PAD ARMBOARD 7.5X6 YLW CONV (MISCELLANEOUS) ×6 IMPLANT
PEN SKIN MARKING BROAD (MISCELLANEOUS) ×3 IMPLANT
PENCIL BUTTON HOLSTER BLD 10FT (ELECTRODE) ×3 IMPLANT
PLUG ATRIUM AND PATCH X LRG (MISCELLANEOUS) IMPLANT
SCALPEL HARMONIC ACE (MISCELLANEOUS) ×1 IMPLANT
SCISSORS LAP 5X35 DISP (ENDOMECHANICALS) ×2 IMPLANT
SET IRRIG TUBING LAPAROSCOPIC (IRRIGATION / IRRIGATOR) ×3 IMPLANT
SLEEVE Z-THREAD 5X100MM (TROCAR) ×2 IMPLANT
SPECIMEN JAR SMALL (MISCELLANEOUS) IMPLANT
SPONGE LAP 18X18 X RAY DECT (DISPOSABLE) ×2 IMPLANT
SUT MNCRL AB 4-0 PS2 18 (SUTURE) ×3 IMPLANT
SUT PROLENE 0 CT 1 CR/8 (SUTURE) ×3 IMPLANT
SUT PROLENE 2 0 CT2 30 (SUTURE) ×9 IMPLANT
SUT VIC AB 2-0 SH 27 (SUTURE) ×6
SUT VIC AB 2-0 SH 27X BRD (SUTURE) ×2 IMPLANT
SUT VIC AB 3-0 SH 27 (SUTURE) ×6
SUT VIC AB 3-0 SH 27X BRD (SUTURE) ×2 IMPLANT
SUT VIC AB 4-0 PS2 27 (SUTURE) ×3 IMPLANT
SUT VICRYL 0 TIES 12 18 (SUTURE) IMPLANT
SUT VICRYL 4-0 PS2 18IN ABS (SUTURE) ×1 IMPLANT
SUT VICRYL AB 3 0 TIES (SUTURE) IMPLANT
SYR BULB 3OZ (MISCELLANEOUS) ×3 IMPLANT
SYR CONTROL 10ML LL (SYRINGE) ×3 IMPLANT
TOWEL OR 17X24 6PK STRL BLUE (TOWEL DISPOSABLE) ×3 IMPLANT
TOWEL OR 17X26 10 PK STRL BLUE (TOWEL DISPOSABLE) ×3 IMPLANT
TRAY FOLEY CATH 14FRSI W/METER (CATHETERS) ×3 IMPLANT
TRAY LAPAROSCOPIC (CUSTOM PROCEDURE TRAY) ×3 IMPLANT
TROCAR HASSON GELL 12X100 (TROCAR) IMPLANT
TROCAR Z-THREAD FIOS 11X100 BL (TROCAR) ×3 IMPLANT
TROCAR Z-THREAD FIOS 5X100MM (TROCAR) ×5 IMPLANT
TUBE CONNECTING 12X1/4 (SUCTIONS) IMPLANT
WATER STERILE IRR 1000ML POUR (IV SOLUTION) IMPLANT
YANKAUER SUCT BULB TIP NO VENT (SUCTIONS) IMPLANT

## 2012-04-24 NOTE — Anesthesia Preprocedure Evaluation (Signed)
Anesthesia Evaluation  Patient identified by MRN, date of birth, ID band Patient awake    Reviewed: Allergy & Precautions, H&P , NPO status , Patient's Chart, lab work & pertinent test results  Airway Mallampati: II  Neck ROM: full    Dental   Pulmonary sleep apnea ,          Cardiovascular     Neuro/Psych  Neuromuscular disease    GI/Hepatic GERD-  ,  Endo/Other    Renal/GU      Musculoskeletal   Abdominal   Peds  Hematology   Anesthesia Other Findings   Reproductive/Obstetrics                           Anesthesia Physical Anesthesia Plan  ASA: II  Anesthesia Plan: General   Post-op Pain Management:    Induction: Intravenous  Airway Management Planned: Oral ETT  Additional Equipment:   Intra-op Plan:   Post-operative Plan:   Informed Consent: I have reviewed the patients History and Physical, chart, labs and discussed the procedure including the risks, benefits and alternatives for the proposed anesthesia with the patient or authorized representative who has indicated his/her understanding and acceptance.     Plan Discussed with: CRNA and Surgeon  Anesthesia Plan Comments:         Anesthesia Quick Evaluation

## 2012-04-24 NOTE — Op Note (Signed)
04/24/2012  3:32 PM  PATIENT:  Steven Robinson  61 y.o. male  PRE-OPERATIVE DIAGNOSIS:  incisional hernia, Right inguinal hernia  POST-OPERATIVE DIAGNOSIS:  incisional hernia, Right inguinal hernia  PROCEDURE:  Procedure(s): LAPAROSCOPIC VENTRAL HERNIA REPAIR WITH MESH HERNIA REPAIR RIGHT INGUINAL ADULT INSERTION OF MESH  SURGEON:  Violeta Gelinas, MD  PHYSICIAN ASSISTANT:   ASSISTANTS: Romie Levee, MD   ANESTHESIA:   local and general  EBL:  Total I/O In: 2250 [I.V.:2000; IV Piggyback:250] Out: 75 [Urine:75]  BLOOD ADMINISTERED:none  DRAINS: none   SPECIMEN:  No Specimen  DISPOSITION OF SPECIMEN:  N/A  COUNTS:  YES  DICTATION: .Dragon Dictation  Presents for laparoscopic incisional hernia repair with mesh and right inguinal hernia repair with mesh. He was identified in the preop holding area. He received intravenous antibiotics. Informed consent was obtained. He was brought to the operating room and general endotracheal anesthesia was a Optician, dispensing by the anesthesia staff. His abdomen both groins were prepped and draped in sterile fashion. Time out procedure was done. Attention was first directed to the incisional hernia. An area in his right upper quadrant along his costal margin was infiltrated with local. Small incision was made. 5 mm Optiview port was placed under standard technique without difficulty. There were no complications. Abdomen was insufflated with carbon dioxide in standard fashion. Under direct vision an 11 mm right lower cautery and 2 5 mm Left abdominal ports were placed. Laparoscopic exploration revealed a good-sized hernia defect. Some omentum was adherent to it this was gradually taken down using blunt and sharp dissection. The previous gastrostomy tube site was identified. This was several centimeters away from the edge of the fascia. We stayed away from that area. There appeared to be adequate space to place the mesh. The falciform ligament was divided with  the harmonic scalpel. All of the omentum was taken down out of the hernia. The hernia edges were clearly defined. A spinal needle was used to measure out the edges. Allowing for a form to 5 cm further overlap, a 25 x 20 cm physeal mesh was chosen. 6 0 Vicryl Prolenes were placed In the mesh with 1 planned to the on the fascial side with space for the gastric adhesions. The mesh was rolled up and placed into the abdomen via the 11 mm port. Mesh was unfurled. 6 stab wounds were made and then the Endo Catch was used to grasp the sutures. A separate stab wound was made for each limb of the suture. These were all brought up and tied. The mesh lay down well against abdominal wall. 2 concentric circles of secure strap tacks were then placed. The mesh was away from the gastric adhesions. It laid nicely. At completion there was no significant bleeding. Pneumoperitoneum was released. Ports were removed. Port sites were closed with running 4-0 Vicryl subcuticular stitch followed by Dermabond. Stab wounds were closed with Dermabond. Attention was directed to the right inguinal region. A towel that had been placed to cover that area was removed. Right groin incision was made. Subcutaneous tissues were dissected down through Scarpa's fascia. The external oblique was visualized. There is a large hernia. External oblique was divided laterally and this division was continued sharply down through the external ring. Superior leaflet the external oblique was dissected free off the transversalis. Inferior leaflet was dissected down revealing the shelving edge of inguinal ligament. Cord structures were encircled with a Penrose drain. The floor was inspected and had some laxity but there is no direct hernia.  There is a large indirect hernia sac associated with the cord structures. It was completely dissected away from the cord. It was opened, and found to contain only omentum. This was reduced back into the abdomen. The sac was then high  ligated with 2-0 Vicryl suture. Sac was excised and discarded. Hernia repair was completed with a keyhole polypropylene mesh cut to custom shape and size. This was tacked up to a point to the tissues over the pubic tubercle medially and a running fashion to the shelving edge of inguinal ligament inferiorly. Superiorly multiple interrupted sutures were used to tack the mesh to the transversalis as well as to the tissues over the pubic tubercle medially. The 2 leaflets of the mesh were rejoined together behind the cord structures and tacked down to the underlying musculature Together With more interrupted 2-0 Prolene sutures.[name] Aperture in the mesh just admitted the tip of the fifth digit.  Local anesthetic was injected. The area was irrigated and hemostasis was ensured. External oblique fascia was closed with running 2-0 Vicryl suture. Scarpa's fascia was closed with interrupted 3-0 Vicryl suture. Skin was closed with running 4 Monocryl subcuticular stitch followed by Dermabond. All counts were correct. Patient tolerated procedure well without apparent complication and was taken recovery in stable condition. The right testicle was relocated into the scrotum at the completion of the procedure.   PATIENT DISPOSITION:  PACU - hemodynamically stable.   Delay start of Pharmacological VTE agent (>24hrs) due to surgical blood loss or risk of bleeding:  no  Violeta Gelinas, MD, MPH, FACS Pager: [phone]  11/11/20133:32 PM

## 2012-04-24 NOTE — Transfer of Care (Signed)
Immediate Anesthesia Transfer of Care Note  Patient: Steven Robinson  Procedure(s) Performed: Procedure(s) (LRB) with comments: LAPAROSCOPIC VENTRAL HERNIA (N/A) - laparoscopic repair incisional hernia with mesh HERNIA REPAIR INGUINAL ADULT (Right) INSERTION OF MESH (Right)  Patient Location: PACU  Anesthesia Type:General  Level of Consciousness: awake, alert  and oriented  Airway & Oxygen Therapy: Patient Spontanous Breathing and Patient connected to nasal cannula oxygen  Post-op Assessment: Report given to PACU RN and Post -op Vital signs reviewed and stable  Post vital signs: Reviewed and stable  Complications: No apparent anesthesia complications

## 2012-04-24 NOTE — Interval H&P Note (Signed)
History and Physical Interval Note:  04/24/2012 1:04 PM  Steven Robinson  has presented today for surgery, with the diagnosis of incisional hernia, Right inguinal hernia  The various methods of treatment have been discussed with the patient and family. After consideration of risks, benefits and other options for treatment, the patient has consented to  Procedure(s) (LRB) with comments: LAPAROSCOPIC VENTRAL HERNIA (N/A) - laparoscopic repair incisional hernia with mesh, repair right inguinal hernia with mesh HERNIA REPAIR INGUINAL ADULT (Right) INSERTION OF MESH (Right) as a surgical intervention .  The patient's history has been reviewed, patient re-examined, no change in status, stable for surgery.  I have reviewed the patient's chart and labs.  Questions were answered to the patient's satisfaction.     [name] E

## 2012-04-24 NOTE — Anesthesia Postprocedure Evaluation (Signed)
Anesthesia Post Note  Patient: Steven Robinson  Procedure(s) Performed: Procedure(s) (LRB): LAPAROSCOPIC VENTRAL HERNIA (N/A) HERNIA REPAIR INGUINAL ADULT (Right) INSERTION OF MESH (Right)  Anesthesia type: general  Patient location: PACU  Post pain: Pain level controlled  Post assessment: Patient's Cardiovascular Status Stable  Last Vitals:  Filed Vitals:   04/24/12 1615  BP: 132/64  Pulse:   Temp:   Resp:     Post vital signs: Reviewed and stable  Level of consciousness: sedated  Complications: No apparent anesthesia complications

## 2012-04-24 NOTE — Preoperative (Signed)
Beta Blockers   Reason not to administer Beta Blockers: not prescribed 

## 2012-04-24 NOTE — OR Nursing (Signed)
1st procedure end at 1455. 2nd procedure start at 1455.

## 2012-04-24 NOTE — H&P (Signed)
  HPI  Patient is status post open gastrostomy tube. He is completed treatment for head and neck cancer. He has a known right inguinal hernia. He is also developed an incisional hernia from his gastrostomy tube placement. He has not used his gastrostomy tube for a couple months. He is here for removal and discussion of treatment of his hernias.  Review of Systems   Objective:   Physical Exam  HENT:  Skin changes from radiation treatment for head and neck  Cardiovascular: Normal rate and regular rhythm.  Pulmonary/Chest: Effort normal. No respiratory distress. He has no wheezes.  Abdominal: Soft. He exhibits no distension. There is no tenderness. There is no rebound.  Gastrostomy tube with some granulation tissue at the site. Patient is a large incisional hernia involving most of his incision, it spontaneously reduces when he lays down. There is no tenderness. In addition, he has a reducible right inguinal hernia. It does not extend into the scrotum. No evidence of left angle hernia. Testes are descended and nonedematous.  Neurological:  Neuropathy right upper extremity   Gastrostomy tube site was prepped in sterile fashion. 1% lidocaine with epinephrine was injected around it. It was removed intact without difficulty. Silver nitrate was applied to the granulation tissue. A bulky gauze dressing was applied.   Assessment:    Gastrostomy tube no longer needed, incisional hernia, right inguinal hernia   Plan:    Gastrostomy tube was removed without difficulty. He tolerated this well. Wound instructions were given. I will see him back in one month to discuss further management of his hernias. We will plan laparoscopic repair of his incisional hernia with mesh. Biologic mesh may be required. Will also plan open repair of a right inguinal hernia at that time. He will call with questions in the interim.   Addendum - no changes, for laparoscopic repair incisional hernia with mesh, and right inguinal  hernia repair with mesh.  Violeta Gelinas, MD, MPH, FACS Pager: [phone]

## 2012-04-24 NOTE — Anesthesia Procedure Notes (Signed)
Procedure Name: Intubation Date/Time: 04/24/2012 1:37 PM Performed by: Gayla Medicus Pre-anesthesia Checklist: Patient identified, Timeout performed, Emergency Drugs available, Suction available and Patient being monitored Patient Re-evaluated:Patient Re-evaluated prior to inductionOxygen Delivery Method: Circle system utilized Preoxygenation: Pre-oxygenation with 100% oxygen Intubation Type: IV induction Ventilation: Mask ventilation without difficulty Laryngoscope Size: Mac and 3 Grade View: Grade II Tube type: Oral Tube size: 7.5 mm Number of attempts: 1 Airway Equipment and Method: Stylet Placement Confirmation: ETT inserted through vocal cords under direct vision,  positive ETCO2 and breath sounds checked- equal and bilateral Secured at: 23 cm Tube secured with: Tape Dental Injury: Teeth and Oropharynx as per pre-operative assessment

## 2012-04-25 ENCOUNTER — Encounter (HOSPITAL_COMMUNITY): Payer: Self-pay | Admitting: General Surgery

## 2012-04-25 MED ORDER — POLYETHYLENE GLYCOL 3350 17 G PO PACK
17.0000 g | PACK | Freq: Every day | ORAL | Status: DC
  Administered 2012-04-25: 17 g via ORAL
  Filled 2012-04-25: qty 1

## 2012-04-25 MED ORDER — HYDROMORPHONE HCL 4 MG PO TABS: 4.0000 mg | ORAL_TABLET | ORAL | Status: DC | PRN

## 2012-04-25 MED ORDER — BOOST PLUS PO LIQD
474.0000 mL | Freq: Three times a day (TID) | ORAL | Status: DC
  Administered 2012-04-25 (×2): 474 mL via ORAL
  Filled 2012-04-25 (×5): qty 474

## 2012-04-25 NOTE — Progress Notes (Signed)
Orthopedic Tech Progress Note Patient Details:  Steven Robinson 1951-05-06 [id number]  Ortho Devices Type of Ortho Device: Abdominal binder Ortho Device/Splint Interventions: Application   Shawnie Pons 04/25/2012, 8:30 AM

## 2012-04-25 NOTE — Discharge Summary (Signed)
Physician Discharge Summary  Patient ID: Steven Robinson MRN: [id number] DOB/AGE: 08/28/1950 61 y.o.  Admit date: 04/24/2012 Discharge date: 04/25/2012  Admission Diagnoses:incisional and right inguinal hernias  Discharge Diagnoses: S/P laparoscopic repair incisional hernia with mesh and repair right inguinal hernia with mesh Active Problems:  * No active hospital problems. *    Discharged Condition: good  Hospital Course: Patient underwent laparoscopic repair incisional hernia with mesh and repair right inguinal hernia with mesh.  He did well post op and is discharged on POD#1.  Consults: None  Discharge Exam: Blood pressure 124/72, pulse 99, temperature 98.1 F (36.7 C), temperature source Oral, resp. rate 20, height 6\' 1"  (1.854 m), weight 102.286 kg (225 lb 8 oz), SpO2 98.00%. see today's progress note  Disposition: 01-Home or Self Care  Discharge Orders    Future Appointments: Provider: Department: Dept Phone: Center:   04/27/2012 11:00 AM Mc-Dahoc Dennie Bible [facility] SAME DAY SURGERY [phone] None   05/03/2012 11:00 AM Nonda Lou, PT [facility] [phone] [facility]   05/10/2012 11:10 AM Liz Malady, MD [facility], [place] [phone] None   05/22/2012 8:45 AM Radene Gunning [facility] MEDICAL ONCOLOGY [phone] None   05/22/2012 9:15 AM Amy Allegra Grana, PA [facility] MEDICAL ONCOLOGY [phone] None   05/22/2012 1:00 PM [facility] MEDICAL ONCOLOGY [phone] None   05/22/2012 1:00 PM Barron Alvine, CCC-SLP [facility] [phone] [facility]   06/12/2012 10:00 AM [facility] [phone] [facility] LONG   07/03/2012 9:00 AM Windell Hum[facility]bird [facility] MEDICAL ONCOLOGY [phone] None   07/03/2012 9:30 AM Lowella Dell, MD [facility] MEDICAL ONCOLOGY [phone] None     Future Orders Please Complete By Expires   Diet - low sodium heart healthy      Increase activity slowly      Discharge instructions      Comments:   [facility] _______[facility]  UMBILICAL OR INGUINAL HERNIA REPAIR: POST OP INSTRUCTIONS  Always review your discharge instruction sheet given to you by the facility where your surgery was performed. IF YOU HAVE DISABILITY OR FAMILY LEAVE FORMS, YOU MUST BRING THEM TO THE OFFICE FOR PROCESSING.   DO NOT GIVE THEM TO YOUR DOCTOR.  A  prescription for pain medication may be given to you upon discharge.  Take your pain medication as prescribed, if needed.  If narcotic pain medicine is not needed, then you may take acetaminophen (Tylenol) or ibuprofen (Advil) as needed. Take your usually prescribed medications unless otherwise directed. If you need a refill on your pain medication, please contact your pharmacy.  They will contact our office to request authorization. Prescriptions will not be filled after 5 pm or on week-ends. You should follow a light diet the first 24 hours after arrival home, such as soup and crackers, etc.  Be sure to include lots of fluids daily.  Resume your normal diet the day after surgery. Most patients will experience some swelling and bruising around the umbilicus or in the groin and scrotum.  Ice packs and reclining will help.  Swelling and bruising can take several days to resolve.  It is common to experience some constipation if taking pain medication after surgery.  Increasing fluid intake and taking a stool softener (such as Colace) will usually help or prevent this problem from occurring.  A mild  laxative (Milk of Magnesia or Miralax) should be taken according to package directions if there are no bowel movements after 48 hours. Unless discharge instructions indicate otherwise, you may remove your bandages 24-48 hours after surgery, and you may shower  at that time.  You may have steri-strips (small skin tapes) in place directly over the incision.  These strips should be left on the skin for 7-10 days.  If your surgeon used skin glue on the incision, you may shower in 24 hours.  The glue will flake off over the next 2-3 weeks.  Any sutures or staples will be removed at the office during your follow-up visit. ACTIVITIES:  You may resume regular (light) daily activities beginning the next day-such as daily self-care, walking, climbing [place]-gradually increasing activities as tolerated.  You may have sexual intercourse when it is comfortable.  Refrain from any heavy lifting or straining until approved by your doctor. You may drive when you are no longer taking prescription pain medication, you can comfortably wear a seatbelt, and you can safely maneuver your car and apply brakes. RETURN TO WORK:  __________________________________________________________ Bonita Quin should see your doctor in the office for a follow-up appointment approximately 2-3 weeks after your surgery.  Make sure that you call for this appointment within a day or two after you arrive home to insure a convenient appointment time. OTHER INSTRUCTIONS:  __________________________________________________________________________________________________________________________________________________________________________________________  WHEN TO CALL YOUR DOCTOR: Fever over 101.0 Inability to urinate Nausea and/or vomiting Extreme swelling or bruising Continued bleeding from incision. Increased pain, redness, or drainage from the incision  The clinic staff is available to answer your questions during regular business hours.  Please don't hesitate to call and ask to speak to one of the nurses for clinical concerns.  If you have a medical emergency, go to the nearest emergency room or call 911.  A surgeon from [facility] is always on call at the hospital   [address], [place], [place]  [address] ?  [address] [address], [place], [place]   [address] [phone] ? [phone] ? FAX [phone] Web site: [email or web address]   Lifting restrictions      Comments:   Nothing over 10lbs for 6 weeks   Other Restrictions      Comments:   Wear abdominal binder at all times as tolerated       Medication List     As of 04/25/2012  2:32 PM    TAKE these medications         aspirin EC 81 MG tablet   Take 81 mg by mouth daily.      dexlansoprazole 60 MG capsule   Commonly known as: DEXILANT   Take 60 mg by mouth at bedtime.      docusate sodium 100 MG capsule   Commonly known as: COLACE   Take 100 mg by mouth 3 (three) times daily.      fentaNYL 50 MCG/HR   Commonly known as: DURAGESIC - dosed mcg/hr   Place 1 patch onto the skin every 3 (three) days.      HYDROmorphone 4 MG tablet   Commonly known as: DILAUDID   Take 1-2 tablets (4-8 mg total) by mouth every 4 (four) hours as needed for pain.      HYDROmorphone 4 MG tablet   Commonly known as: DILAUDID   Take 4-8 mg by mouth every 4 (four) hours as needed. For pain      lactose free nutrition Liqd  Take 237 mLs by mouth 3 (three) times daily with meals. 6-8 cans per day      lidocaine 5 %   Commonly known as: LIDODERM   as needed.      metaxalone 800 MG tablet   Commonly known as: SKELAXIN   Take 800 mg by mouth 3 (three) times daily.      naproxen 500 MG tablet   Commonly known as: NAPROSYN   Take 1 tablet (500 mg total) by mouth 3 (three) times daily with meals.      NASONEX 50 MCG/ACT nasal spray   Generic drug: mometasone   Place 2 sprays into the nose daily as needed.      pilocarpine 5 MG tablet   Commonly known as: SALAGEN   Take 5 mg by mouth 3 (three) times daily.      pilocarpine 5 MG tablet   Commonly known as: SALAGEN   Take 5 mg by mouth 3 (three) times daily.      pilocarpine 5 MG tablet   Commonly known as: SALAGEN   Take 5 mg by mouth 3  (three) times daily.      pilocarpine 5 MG tablet   Commonly known as: SALAGEN   Take 5 mg by mouth 3 (three) times daily.      pilocarpine 5 MG tablet   Commonly known as: SALAGEN   Take 5 mg by mouth 3 (three) times daily.      pilocarpine 5 MG tablet   Commonly known as: SALAGEN   Take 5 mg by mouth 3 (three) times daily.      polyethylene glycol packet   Commonly known as: MIRALAX / GLYCOLAX   Take 17 g by mouth 2 (two) times daily.      pregabalin 100 MG capsule   Commonly known as: LYRICA   Take 100 mg by mouth 3 (three) times daily.      rosuvastatin 40 MG tablet   Commonly known as: CRESTOR   Take 40 mg by mouth at bedtime.      Tamsulosin HCl 0.4 MG Caps   Commonly known as: FLOMAX   Take 0.4 mg by mouth at bedtime.      zolpidem 10 MG tablet   Commonly known as: AMBIEN   Take 10 mg by mouth at bedtime.         SignedLiz Malady 04/25/2012, 2:32 PM

## 2012-04-25 NOTE — Telephone Encounter (Signed)
Old duplicate entry

## 2012-04-25 NOTE — Progress Notes (Signed)
1 Day Post-Op  Subjective: Sore, requests miralax and Boost  Objective: Vital signs in last 24 hours: Temp:  [97.1 F (36.2 C)-97.7 F (36.5 C)] 97.5 F (36.4 C) (11/12 0518) Pulse Rate:  [68-99] 89  (11/12 0518) Resp:  [12-22] 20  (11/12 0518) BP: (109-132)/(54-89) 115/61 mmHg (11/12 0518) SpO2:  [92 %-100 %] 95 % (11/12 0518) Weight:  [102.286 kg (225 lb 8 oz)] 102.286 kg (225 lb 8 oz) (11/11 1724) Last BM Date: 04/24/12  Intake/Output from previous day: 11/11 0701 - 11/12 0700 In: 2610 [P.O.:360; I.V.:2000; IV Piggyback:250] Out: 700 [Urine:675; Blood:25] Intake/Output this shift:    Resp: clear to auscultation bilaterally Cardio: regular rate and rhythm GI: incisions all CDI, soft  Lab Results:  No results found for this basename: WBC:2,HGB:2,HCT:2,PLT:2 in the last 72 hours BMET No results found for this basename: NA:2,K:2,CL:2,CO2:2,GLUCOSE:2,BUN:2,CREATININE:2,CALCIUM:2 in the last 72 hours PT/INR No results found for this basename: LABPROT:2,INR:2 in the last 72 hours ABG No results found for this basename: PHART:2,PCO2:2,PO2:2,HCO3:2 in the last 72 hours  Studies/Results: No results found.  Anti-infectives: Anti-infectives     Start     Dose/Rate Route Frequency Ordered Stop   04/24/12 0600   ceFAZolin (ANCEF) IVPB 2 g/50 mL premix        2 g 100 mL/hr over 30 Minutes Intravenous On call to O.R. 04/23/12 1238 04/24/12 1340          Assessment/Plan: s/p Procedure(s) (LRB) with comments: LAPAROSCOPIC VENTRAL HERNIA (N/A) - laparoscopic repair incisional hernia with mesh HERNIA REPAIR INGUINAL ADULT (Right) INSERTION OF MESH (Right) S/P Lap repair incisional hernia with mesh, repair RIH with mesh POD#1 Abdominal binder Miralax FEN - Boost (patient drinks at home) Will see how pain control is this PM  LOS: 1 day    Noemy Hallmon E 04/25/2012

## 2012-04-25 NOTE — Progress Notes (Signed)
Patient discharged to home with instructions. 

## 2012-04-25 NOTE — Progress Notes (Signed)
Utilization review completed. Sarea Fyfe, RN, BSN. 

## 2012-04-26 ENCOUNTER — Encounter (INDEPENDENT_AMBULATORY_CARE_PROVIDER_SITE_OTHER): Payer: Managed Care, Other (non HMO) | Admitting: General Surgery

## 2012-04-27 ENCOUNTER — Other Ambulatory Visit (HOSPITAL_COMMUNITY): Payer: Self-pay

## 2012-05-03 ENCOUNTER — Ambulatory Visit: Payer: Managed Care, Other (non HMO) | Attending: Radiation Oncology | Admitting: Physical Therapy

## 2012-05-03 DIAGNOSIS — IMO0001 Reserved for inherently not codable concepts without codable children: Secondary | ICD-10-CM | POA: Insufficient documentation

## 2012-05-03 DIAGNOSIS — R1313 Dysphagia, pharyngeal phase: Secondary | ICD-10-CM | POA: Insufficient documentation

## 2012-05-09 ENCOUNTER — Ambulatory Visit: Payer: Managed Care, Other (non HMO) | Admitting: Physical Therapy

## 2012-05-10 ENCOUNTER — Encounter (INDEPENDENT_AMBULATORY_CARE_PROVIDER_SITE_OTHER): Payer: Self-pay | Admitting: General Surgery

## 2012-05-10 ENCOUNTER — Ambulatory Visit (INDEPENDENT_AMBULATORY_CARE_PROVIDER_SITE_OTHER): Payer: Managed Care, Other (non HMO) | Admitting: General Surgery

## 2012-05-10 VITALS — BP 120/70 | HR 78 | Temp 97.0°F | Resp 18 | Ht 73.0 in | Wt 221.0 lb

## 2012-05-10 DIAGNOSIS — Z09 Encounter for follow-up examination after completed treatment for conditions other than malignant neoplasm: Secondary | ICD-10-CM

## 2012-05-10 NOTE — Patient Instructions (Signed)
No lifting and to January 1. Wear binder as tolerated.

## 2012-05-10 NOTE — Progress Notes (Signed)
Subjective:     Patient ID: Steven Robinson, male   DOB: 05-14-1951, 61 y.o.   MRN: [id number]  HPI  Patient underwent laparoscopic repair of incisional hernia with mesh and right inguinal hernia repair with mesh on 04/24/2012. Postoperatively he has done very well. He is wearing an abdominal binder. He is avoiding heavy lifting. He is tapering his pain medication under the advisement of his pain management physician. Review of Systems     Objective:   Physical Exam Abdomen soft and nontender. Incisional hernia repair is intact. No significant surrounding it. No evidence of infection. Right inguinal incision is clean dry and intact. Hernia repair is intact. Minimal edema of the right testicle.    Assessment:     Number status post arthroscopic repair of incisional hernia with mesh and Repair right inguinal hernia with mesh    Plan:     Avoid any heavy lifting until January 1. Continue to wear binder for comfort and support. Return when necessary.

## 2012-05-15 ENCOUNTER — Ambulatory Visit: Payer: Managed Care, Other (non HMO) | Attending: Radiation Oncology

## 2012-05-15 DIAGNOSIS — IMO0001 Reserved for inherently not codable concepts without codable children: Secondary | ICD-10-CM | POA: Insufficient documentation

## 2012-05-15 DIAGNOSIS — R1313 Dysphagia, pharyngeal phase: Secondary | ICD-10-CM | POA: Insufficient documentation

## 2012-05-17 ENCOUNTER — Ambulatory Visit: Payer: Managed Care, Other (non HMO)

## 2012-05-22 ENCOUNTER — Telehealth: Payer: Self-pay | Admitting: Oncology

## 2012-05-22 ENCOUNTER — Ambulatory Visit (HOSPITAL_BASED_OUTPATIENT_CLINIC_OR_DEPARTMENT_OTHER): Payer: Managed Care, Other (non HMO) | Admitting: Physician Assistant

## 2012-05-22 ENCOUNTER — Ambulatory Visit: Payer: Managed Care, Other (non HMO)

## 2012-05-22 ENCOUNTER — Encounter: Payer: Self-pay | Admitting: Physician Assistant

## 2012-05-22 ENCOUNTER — Other Ambulatory Visit (HOSPITAL_BASED_OUTPATIENT_CLINIC_OR_DEPARTMENT_OTHER): Payer: Managed Care, Other (non HMO) | Admitting: Lab

## 2012-05-22 VITALS — BP 101/65 | HR 65 | Temp 97.5°F | Resp 20 | Ht 73.0 in | Wt 223.6 lb

## 2012-05-22 DIAGNOSIS — C109 Malignant neoplasm of oropharynx, unspecified: Secondary | ICD-10-CM

## 2012-05-22 DIAGNOSIS — G9589 Other specified diseases of spinal cord: Secondary | ICD-10-CM

## 2012-05-22 DIAGNOSIS — D696 Thrombocytopenia, unspecified: Secondary | ICD-10-CM

## 2012-05-22 DIAGNOSIS — K117 Disturbances of salivary secretion: Secondary | ICD-10-CM

## 2012-05-22 DIAGNOSIS — C770 Secondary and unspecified malignant neoplasm of lymph nodes of head, face and neck: Secondary | ICD-10-CM

## 2012-05-22 LAB — CBC WITH DIFFERENTIAL/PLATELET
Eosinophils Absolute: 0.2 10*3/uL (ref 0.0–0.5)
HCT: 36.2 % — ABNORMAL LOW (ref 38.4–49.9)
LYMPH%: 10.8 % — ABNORMAL LOW (ref 14.0–49.0)
MCHC: 34.4 g/dL (ref 32.0–36.0)
MCV: 92.5 fL (ref 79.3–98.0)
MONO%: 12.7 % (ref 0.0–14.0)
NEUT%: 70 % (ref 39.0–75.0)
Platelets: 132 10*3/uL — ABNORMAL LOW (ref 140–400)
RBC: 3.92 10*6/uL — ABNORMAL LOW (ref 4.20–5.82)

## 2012-05-22 NOTE — Telephone Encounter (Signed)
No new pof sent. Per pt he is just to keep his next f/u in January 2014 and pet 06/12/12. Pt given copy of existing schedule for December 2013 pet and January 2014 lb/GM

## 2012-05-22 NOTE — Progress Notes (Signed)
ID: Steven Robinson   DOB: 08-04-1950  MR#: [id number]  WGN#:[id number]  PCP: Herb Grays, MD SU: Violeta Gelinas MD; Suzanna Obey MD OTHER MD: Lonie Peak, Levert Feinstein, Shirlean Kelly   HISTORY OF PRESENT ILLNESS: HPI: The patient noted an enlarging left neck mass early March 2013, and brought this to the attention of his PCP, Dr. Dewain Penning. She obtained a neck CT at [facility] 08/20/2011, which showed multiple enlarged nodes in the left mid-neck along the jugular v., the largest 2.0 cm. She referred the patient to Suzanna Obey in ENT and FNA of one of the enlarged nodes was positive for squamous cell carcinoma ([id number]). A p16 stain was read as equivocal. PET scan 09/03/2011 showed SUVs >6 in the left oropharynx and left jugular chain nodes; SUV of 5.49 was noted in the larynx. There was no evidence of contralateral or disseminated disease.  This was consistent with a clinical T1 N2,stage IVA left oropharyngeal SCCA. However multiple biopsies under Dr. Jearld Fenton 09/09/2011 [[id number]] were all negative. The pathologic staging therefore is TX N2, which impacts radiation planning.  The patient met with Drs. Cruzita Lederer, and IMRT with concurrent chemosensitization was planned. The patient had dental evaluation under Dr. Kristin Bruins, several extractions under Dr. Chales Salmon, had audiometry (showing mild high-frequency hearing loss) and met with nutrition. A PEG tube placed by IR unfortunately became dislodged and infected, and had to be replaced through an open procedure under Violeta Gelinas. This delayed the start of chemotherapy and caused a change in plan from the original CDDP at 100 mg/m2 Q3w to 40 mg/M2 weekly. Radiation was started 10/04/2011 and chemotherapy 10/25/2011. The patient's subsequent history is as detailed below.   INTERVAL HISTORY: Julio returns today with his wife [name] for followup of his squamous cell carcinoma of the oropharynx. He continues to be seen by neurology for his diagnosis of radiation  myelopathy.  He has discontinued naproxen, and is now taking only 2 tablets of Dilaudid daily, 4 mg at 2 PM, and 4 mg at bedtime. He is also utilizing a fentanyl patch, 50 mcg per hour, replaced every 3 days. His pain level average as a 3 on a scale of 1-10 when he follows the above regimen.  Interval history is remarkable for a right inguinal hernia and ventral hernia repair on 04/24/2012. He had no complications, and has recovered well.   REVIEW OF SYSTEMS: Algis denies any fevers or chills. He still has no appetite and few things taste normal. He is still drinking 6 Boosts daily, in addition to eating small amounts of food, and is maintaining his weight. He's had no problems with nausea, emesis, or change in bowel habits. No oral ulcerations, sensitivity, or problems swallowing.His mouth is still dry. He denies any cough, shortness of breath, or chest pain. No abnormal headaches or dizziness. He still has pain in the right hand although, as noted above, this is currently well-controlled.  A detailed review of systems is otherwise noncontributory.    PAST MEDICAL HISTORY: Past Medical History  Diagnosis Date  . Hyperlipoproteinemia   . Obstructive sleep apnea     on C-pap.   [name] GERD (gastroesophageal reflux disease)     chronic  . Hx of colonic polyps   . 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 Vertigo     2/2 intersitial neuritis.   [name] Hearing loss of left ear   . BPH (benign prostatic hyperplasia)   . Hyperlipidemia   . Mucositis 11/01/2011  .  S/P radiation therapy 10/04/11-11/22/11    Mucosal Axis/Neck:7000cGy/35Fractions  . Status post chemotherapy     CDDP During with Radiation therapy  . Gastrostomy tube dependent     removed Oct. 2013  . 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 Neuromuscular disorder     myelopathy  . History of EKG     done at PCP office, T. Spears05/21/12    PAST SURGICAL HISTORY: Past Surgical History  Procedure Date  . Fine needle aspiration 08/26/11      LEFT NECK FNA, LYMPH NODE, METASTATIC SQUAMOUS CELL CARCINOMA  . Gastrostomy 09/24/2011    Procedure: GASTROSTOMY;  Surgeon: Liz Malady, MD;  Location: [facility] OR;  Service: General;  Laterality: N/A;  open G-tube placement procedure start @1219   . Rigid esophagoscopy 09/24/2011    Procedure: RIGID ESOPHAGOSCOPY;  Surgeon: Suzanna Obey, MD;  Location: [facility] OR;  Service: ENT;  Laterality: N/A;  Esophagoscopy procedure end @1210   . Hernia repair 1990    Left  . Diagnostic laparoscopy     g-tube replacement   . Ventral hernia repair 04/24/2012    Procedure: LAPAROSCOPIC VENTRAL HERNIA;  Surgeon: Liz Malady, MD;  Location: [facility] OR;  Service: General;  Laterality: N/A;  laparoscopic repair incisional hernia with mesh  . Inguinal hernia repair 04/24/2012    Procedure: HERNIA REPAIR INGUINAL ADULT;  Surgeon: Liz Malady, MD;  Location: [facility] OR;  Service: General;  Laterality: Right;  . Insertion of mesh 04/24/2012    Procedure: INSERTION OF MESH;  Surgeon: Liz Malady, MD;  Location: [facility] OR;  Service: General;  Laterality: Right;    FAMILY HISTORY Family History  Problem Relation Age of Onset  . Alzheimer's disease Mother   . Tics Mother   . Hypertension Father   . Cancer Father 25    Prostate  . Stroke Paternal Grandmother   . Cancer Paternal Grandfather     Lung  and Prostate   the patient's father is alive at age 21. He lives in a retirement community. The patient's mother died at the age of 68 in 11/01/09; she had significant Alzheimer's disease. The patient is an only child.  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 His wife [name] is a Music therapist in oil. The live on 8 acres and have 2 large horses, a donkey, a Engineer, manufacturing, chickens, etc. The patient's daughter [name] lives in [place] and is currently looking for a job. The patient's son [name] works for an Economist.  The patient has 2 grandchildren, granddaughter age 29 and a grandson aged 8 months. He attends the [employer].  ADVANCED DIRECTIVES: in place  HEALTH MAINTENANCE: History  Substance Use Topics  . Smoking status: Former Smoker -- 0.2 packs/day for 3 years    Quit date: 06/14/1972  . Smokeless tobacco: Not on file  . Alcohol Use: No     Comment: Hx of Drinking wine - Stopped 3 years ago     Allergies  Allergen Reactions  . Cyclobenzaprine Hcl Other (See Comments)    confusion    Current Outpatient Prescriptions  Medication Sig Dispense Refill  . aspirin EC 81 MG tablet Take 81 mg by mouth daily.      [name] dexlansoprazole (DEXILANT) 60 MG capsule Take 60 mg by mouth at bedtime.       . docusate sodium (COLACE) 100 MG capsule Take 100 mg by mouth 3 (three) times  daily.      . fentaNYL (DURAGESIC - DOSED MCG/HR) 50 MCG/HR Place 1 patch onto the skin every 3 (three) days.      [name] HYDROmorphone (DILAUDID) 4 MG tablet Take 1-2 tablets (4-8 mg total) by mouth every 4 (four) hours as needed for pain.  50 tablet  0  . lactose free nutrition (BOOST PLUS) LIQD Take 237 mLs by mouth 3 (three) times daily with meals. 6-8 cans per day      . lidocaine (LIDODERM) 5 % as needed.       . metaxalone (SKELAXIN) 800 MG tablet Take 800 mg by mouth 3 (three) times daily.       [name] NASONEX 50 MCG/ACT nasal spray Place 2 sprays into the nose daily as needed.       . pilocarpine (SALAGEN) 5 MG tablet Take 1 tablet (5 mg total) by mouth 3 (three) times daily.  90 tablet  6  . polyethylene glycol (MIRALAX / GLYCOLAX) packet Take 17 g by mouth 2 (two) times daily.      . pregabalin (LYRICA) 100 MG capsule Take 100 mg by mouth 3 (three) times daily.      . rosuvastatin (CRESTOR) 40 MG tablet Take 40 mg by mouth at bedtime.       . Tamsulosin HCl (FLOMAX) 0.4 MG CAPS Take 0.4 mg by mouth at bedtime.       [name] zolpidem (AMBIEN) 10 MG tablet Take 10 mg by mouth at bedtime.       . naproxen (NAPROSYN) 500  MG tablet Take 1 tablet (500 mg total) by mouth 3 (three) times daily with meals.  90 tablet  6    OBJECTIVE: Middle-aged white male  in no acute distress Filed Vitals:   05/22/12 0934  BP: 101/65  Pulse: 65  Temp: 97.5 F (36.4 C)  Resp: 20     Body mass index is 29.50 kg/(m^2).    ECOG FS: 1 Filed Weights   05/22/12 0934  Weight: 223 lb 9.6 oz (101.424 kg)    Sclerae unicteric Oropharynx clear, dry; no thrush or ulcerations. No cervical or supraclavicular adenopathy; Lungs no rales or rhonchi Heart regular rate and rhythm Abd soft, well-healed incisions status post hernia repair, nontender to palpation, positive bowel sounds MSK: No focal spinal tenderness to palpation.  Neuro: Nonfocal, alert and oriented x3;    LAB RESULTS: Lab Results  Component Value Date   WBC 4.1 05/22/2012   NEUTROABS 2.9 05/22/2012   HGB 12.5* 05/22/2012   HCT 36.2* 05/22/2012   MCV 92.5 05/22/2012   PLT 132* 05/22/2012      Chemistry      Component Value Date/Time   NA 137 04/21/2012 1127   NA 136 02/28/2012 1425   K 4.3 04/21/2012 1127   K 3.8 02/28/2012 1425   CL 99 04/21/2012 1127   CL 102 02/28/2012 1425   CO2 30 04/21/2012 1127   CO2 25 02/28/2012 1425   BUN 17 04/21/2012 1127   BUN 15.0 02/28/2012 1425   CREATININE 0.73 04/21/2012 1127   CREATININE 0.8 02/28/2012 1425      Component Value Date/Time   CALCIUM 9.5 04/21/2012 1127   CALCIUM 9.4 02/28/2012 1425   ALKPHOS 58 02/28/2012 1425   ALKPHOS 62 12/13/2011 0950   AST 18 02/28/2012 1425   AST 18 12/13/2011 0950   ALT 21 02/28/2012 1425   ALT 13 12/13/2011 0950   BILITOT 0.80 02/28/2012 1425   BILITOT 0.4 12/13/2011  1610       STUDIES: No results found.    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 diagnosed by FNA of a left cervical node March 2013, treated with IMRT completed 11/22/2011 and concurrent weekly cis-platinum, last dose 11/23/2011  (1) risk factors are smoking  history (minimal and remote), significant alcohol use (discontinued 2010), and an equivocal p16 probe  (2) dizziness, possibly due partly to autonomic dysfunction from cis-platinum, improved  (3) malnutrition: He is maintaining his weight on his current diet  (4) dry mouth: Will continue pilocarpine,  frequent rinses, try artificial saliva  (5) at risk of hypothyroidism: TSH normal SEPT 2013  (6) right hand/arm/shoulder pain: 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  (7) right inguinal hernia/ventral hernia, repaired 04/24/2012  PLAN:   Gevork continues to recover well. He will continue with physical therapy/speech therapy as planned. He'll continue on his current pain regimen. He is already scheduled for a repeat PET scan in late December, and will return to see Dr. Darnelle Catalan in early January.  All this was reviewed in detail, and he voices understanding and agreement with this plan. She will call with any changes or problems.  I am changing his pain regimen as follows: He will take Naprosyn 500 mg 3 times daily with food; he will continue to take diluted 4 mg at 2 PM and 8 mg at around 8 PM. In addition we are adding a fentanyl patch, 25 mcg per hour, to be placed every 3 days. He will call us in about a week let us know how that is working. It may be too much, or he may need to go up to 50 mg. The goal is to get his pain to a 3-4 level (from the current 6-8 level)., Without interfering with his normal daily functions.  He a ready has appointments for scans and visit with Dr. Basilio Cairo, and she will return to see [place] again in early December and late January. Before the late January visit we will repeat a PET scan. He knows to call for any problems that may develop before then.  Zamiya Dillard    05/22/2012

## 2012-05-24 ENCOUNTER — Ambulatory Visit: Payer: Managed Care, Other (non HMO)

## 2012-05-29 ENCOUNTER — Ambulatory Visit: Payer: Managed Care, Other (non HMO) | Admitting: Physical Therapy

## 2012-06-01 ENCOUNTER — Ambulatory Visit: Payer: Managed Care, Other (non HMO)

## 2012-06-05 ENCOUNTER — Telehealth (INDEPENDENT_AMBULATORY_CARE_PROVIDER_SITE_OTHER): Payer: Self-pay | Admitting: General Surgery

## 2012-06-05 NOTE — Telephone Encounter (Signed)
Pt called with question of intermittant pain on his Lt side when he turns or bends to the Lt.  After review of his records, pain is possibly from mesh used to repair the incisional hernia.  Recommended ice to site and NSAIDs for any inflammation.  He understands and will comply.

## 2012-06-06 ENCOUNTER — Other Ambulatory Visit: Payer: Self-pay | Admitting: Radiation Oncology

## 2012-06-06 ENCOUNTER — Telehealth: Payer: Self-pay | Admitting: *Deleted

## 2012-06-06 NOTE — Telephone Encounter (Signed)
Called patient to inform of appt. With Dr. Jearld Fenton on Jan. 7, 2014 at 1:00 pm - arrival time - 12:50 pm, patient to bring co-pay of $20 or $25 , insurance card and current list of meds, lvm for a return call

## 2012-06-06 NOTE — Telephone Encounter (Signed)
xxxxx 

## 2012-06-08 ENCOUNTER — Ambulatory Visit: Payer: Managed Care, Other (non HMO) | Admitting: Physical Therapy

## 2012-06-12 ENCOUNTER — Encounter (HOSPITAL_COMMUNITY)
Admission: RE | Admit: 2012-06-12 | Discharge: 2012-06-12 | Disposition: A | Discharge status: Home / Self Care | Payer: Managed Care, Other (non HMO) | Source: Ambulatory Visit | Attending: Oncology | Admitting: Oncology

## 2012-06-12 DIAGNOSIS — C109 Malignant neoplasm of oropharynx, unspecified: Secondary | ICD-10-CM | POA: Insufficient documentation

## 2012-06-12 LAB — GLUCOSE, CAPILLARY: Glucose-Capillary: 104 mg/dL — ABNORMAL HIGH (ref 70–99)

## 2012-06-12 MED ORDER — FLUDEOXYGLUCOSE F - 18 (FDG) INJECTION
19.2000 | Freq: Once | INTRAVENOUS | Status: AC | PRN
  Administered 2012-06-12: 19.2 via INTRAVENOUS

## 2012-06-15 ENCOUNTER — Ambulatory Visit: Payer: Managed Care, Other (non HMO) | Attending: Radiation Oncology | Admitting: Physical Therapy

## 2012-06-15 DIAGNOSIS — IMO0001 Reserved for inherently not codable concepts without codable children: Secondary | ICD-10-CM | POA: Insufficient documentation

## 2012-06-15 DIAGNOSIS — R1313 Dysphagia, pharyngeal phase: Secondary | ICD-10-CM | POA: Insufficient documentation

## 2012-06-22 ENCOUNTER — Ambulatory Visit: Payer: Managed Care, Other (non HMO) | Admitting: Physical Therapy

## 2012-06-23 ENCOUNTER — Other Ambulatory Visit: Payer: Self-pay | Admitting: Radiation Oncology

## 2012-06-23 ENCOUNTER — Encounter: Payer: Self-pay | Admitting: Radiation Oncology

## 2012-06-23 ENCOUNTER — Inpatient Hospital Stay
Admission: RE | Admit: 2012-06-23 | Discharge: 2012-06-23 | Disposition: A | Discharge status: Home / Self Care | Payer: Managed Care, Other (non HMO) | Source: Ambulatory Visit | Attending: Radiation Oncology | Admitting: Radiation Oncology

## 2012-06-23 VITALS — BP 106/73 | HR 57 | Temp 97.5°F | Wt 219.9 lb

## 2012-06-23 DIAGNOSIS — K117 Disturbances of salivary secretion: Secondary | ICD-10-CM

## 2012-06-23 DIAGNOSIS — C760 Malignant neoplasm of head, face and neck: Secondary | ICD-10-CM

## 2012-06-23 DIAGNOSIS — C770 Secondary and unspecified malignant neoplasm of lymph nodes of head, face and neck: Secondary | ICD-10-CM

## 2012-06-23 MED ORDER — AQUORAL MT AERS: 2.0000 | INHALATION_SPRAY | Freq: Four times a day (QID) | OROMUCOSAL | Status: DC | PRN

## 2012-06-23 NOTE — Progress Notes (Signed)
FU appt. Today.  Last seen on 04/14/12.  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  He continues to have numbness and tingling in his right hand.  He notices a hardened area like a "ball" in the palm of his hand.  He reports continuance of dry mouth.  He is asking if he can try a new medication called Aquarol to assist with decrease of dry mouth.

## 2012-06-23 NOTE — Progress Notes (Addendum)
Radiation Oncology         [phone] ________________________________  Name: Thunder Bridgewater MRN: [id number]  Date: 06/23/2012  DOB: May 02, 1951  Follow-Up Visit Note - Outpatient  CC: Herb Grays, MD  Suzanna Obey, MD  Diagnosis: [id number] Squamous cell carcinoma, head and neck, unknown primary . Presented with left neck nodes.   Interval Since Last Radiation: Completed 70 Gy/35 fractions on 11-22-11 with concurrent cis-platinum  Narrative:  The patient returns today for routine follow-up. His neurologic symptoms are slowly improving. He is tapering off of his narcotics. He continues to be followed in the pain clinic. He does continue to have numbness and tingling in his right hand. He noticed a hard " ball " in the palm of his right hand. He has dry mouth which continues and is wondering if he can try Aquaphor oral to help with this.  He underwent a PET scan at the end of December which showed no residual hypermetabolic activity. Although not commented upon in the report, I can appreciate some residual scar tissue where he had his original lymph node mass in the upper left neck. I will put the patient on tumor board to verify if any followup imaging is recommended by the team. He and his wife are thrilled about this report, and it is a significant relief to them.  In November he underwent laparoscopic repair of an incisional hernia and right inguinal hernia. He is back at work full-time. He knows that he needs to cut back on his work load to decrease excessive stress. He is trying to find that balance.           Overall, he and his wife are pleased with how he is doing clinically. He technologist that he needs to be a little more diligent about dental care. He still follows with his dentist. 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    ALLERGIES:  is allergic to cyclobenzaprine hcl.  Meds: Current Outpatient Prescriptions  Medication Sig Dispense Refill  .  aspirin EC 81 MG tablet Take 81 mg by mouth daily.      [name] dexlansoprazole (DEXILANT) 60 MG capsule Take 60 mg by mouth at bedtime.       . diphenhydrAMINE (BENADRYL) 25 mg capsule Take 25 mg by mouth every 6 (six) hours as needed.      . docusate sodium (COLACE) 100 MG capsule Take 100 mg by mouth 3 (three) times daily.      . fentaNYL (DURAGESIC - DOSED MCG/HR) 50 MCG/HR Place 1 patch onto the skin every 3 (three) days.      [name] HYDROmorphone (DILAUDID) 4 MG tablet Take 1-2 tablets (4-8 mg total) by mouth every 4 (four) hours as needed for pain.  50 tablet  0  . lactose free nutrition (BOOST PLUS) LIQD Take 237 mLs by mouth 3 (three) times daily with meals. 6-8 cans per day      . NASONEX 50 MCG/ACT nasal spray Place 2 sprays into the nose daily as needed.       . pilocarpine (SALAGEN) 5 MG tablet Take 1 tablet (5 mg total) by mouth 3 (three) times daily.  90 tablet  6  . polyethylene glycol (MIRALAX / GLYCOLAX) packet Take 17 g by mouth 2 (two) times daily.      . pregabalin (LYRICA) 100 MG capsule Take 100 mg by mouth 3 (three) times daily.      . rosuvastatin (CRESTOR) 40 MG tablet Take 20  mg by mouth at bedtime.       . Tamsulosin HCl (FLOMAX) 0.4 MG CAPS Take 0.4 mg by mouth at bedtime.       [name] zolpidem (AMBIEN) 10 MG tablet Take 10 mg by mouth at bedtime.       . Artificial Saliva (AQUORAL) AERS Use as directed 2 sprays in the mouth or throat 4 (four) times daily as needed.  1 Can  5    Physical Findings: The patient is in no acute distress. Patient is alert and oriented.  weight is 219 lb 14.4 oz (99.746 kg). His temperature is 97.5 F (36.4 C). His blood pressure is 106/73 and his pulse is 57. [name]  He is not hoarse. Oropharynx is clear with no lesions. His mouth is somewhat dry but he does have some salivary function. His neck demonstrates no significant lymphedema. No palpable lymphadenopathy bilaterally. Tightness in right palm  ?due to chronic flexion of muscle/tendon. Right 5th digit  curled less than previously.  Lab Findings: Lab Results  Component Value Date   WBC 4.1 05/22/2012   HGB 12.5* 05/22/2012   HCT 36.2* 05/22/2012   MCV 92.5 05/22/2012   PLT 132* 05/22/2012    CMP     Component Value Date/Time   NA 137 04/21/2012 1127   NA 136 02/28/2012 1425   K 4.3 04/21/2012 1127   K 3.8 02/28/2012 1425   CL 99 04/21/2012 1127   CL 102 02/28/2012 1425   CO2 30 04/21/2012 1127   CO2 25 02/28/2012 1425   GLUCOSE 90 04/21/2012 1127   GLUCOSE 150* 02/28/2012 1425   BUN 17 04/21/2012 1127   BUN 15.0 02/28/2012 1425   CREATININE 0.73 04/21/2012 1127   CREATININE 0.8 02/28/2012 1425   CALCIUM 9.5 04/21/2012 1127   CALCIUM 9.4 02/28/2012 1425   PROT 6.7 02/28/2012 1425   PROT 6.8 12/13/2011 0950   ALBUMIN 4.1 02/28/2012 1425   ALBUMIN 3.5 12/13/2011 0950   AST 18 02/28/2012 1425   AST 18 12/13/2011 0950   ALT 21 02/28/2012 1425   ALT 13 12/13/2011 0950   ALKPHOS 58 02/28/2012 1425   ALKPHOS 62 12/13/2011 0950   BILITOT 0.80 02/28/2012 1425   BILITOT 0.4 12/13/2011 0950   GFRNONAA >90 04/21/2012 1127   GFRAA >90 04/21/2012 1127     Radiographic Findings: Nm Pet Image Restag (ps) Skull Base To Thigh  06/12/2012  *RADIOLOGY REPORT*  Clinical Data: Subsequent treatment strategy for head and neck cancer.  NUCLEAR MEDICINE PET SKULL BASE TO THIGH  Fasting Blood Glucose:  104  Technique:  19.2 mCi F-18 FDG was injected intravenously. CT data was obtained and used for attenuation correction and anatomic localization only.  (This was not acquired as a diagnostic CT examination.) Additional exam technical data entered on technologist worksheet.  Comparison:  02/24/2012  Findings:  Neck: No hypermetabolic lymph nodes in the neck.  Chest:  No hypermetabolic mediastinal or hilar nodes.  No suspicious pulmonary nodules on the CT scan.  Abdomen/Pelvis:  No abnormal hypermetabolic activity within the liver, pancreas, adrenal glands, or spleen.  No hypermetabolic lymph nodes in the abdomen or pelvis. Interval  postsurgical change along the undersurface of the ventral abdominal wall and the right inguinal region.  There is a moderate amount of free fluid within the dependent portion of the pelvis.  Skeleton:  No focal hypermetabolic activity to suggest skeletal metastasis.  IMPRESSION:  1.  There is no evidence for residual or recurrent hypermetabolic tumor.  2. Postsurgical change from hernia repair.  There is free fluid within the dependent portion of the pelvis, likely related to surgery.   Original Report Authenticated By: Signa Kell, M.D.     Impression/Plan:    1) Head and Neck Cancer Status: He underwent a PET scan at the end of December which showed no residual hypermetabolic activity. Although not commented upon in the report, I can appreciate some residual scar tissue where he had his original lymph node mass in the upper left neck. I will put the patient on tumor board to verify if any followup imaging is recommended by the team.   2) Nutritional Status: He is eating well. PEG tube has been removed.  3) Risk Factors: The patient has been educated about risk factors including alcohol and tobacco abuse; they understand that avoidance of alcohol and tobacco is important to prevent recurrences as well as other cancers.   4) Swallowing: Good: No active issues  5) Dental: Encouraged to continue regular followup with dentistry, and dental hygiene including fluoride rinses.   6) Energy: Good; in fact, the patient acknowledges that he needs to force himself to cut down on his daily work responsibilities to decrease stress level  7) Social: No active social issues to address at this time  8) Neck lymphedema: Excellent response to physical therapy  9) Xerostomia: I reviewed the information on Aquoral and prescribed this for him. We will see if it helps.  10) Neurologic issues with working diagnosis of radiation myelopathy - improved significantly since onset. Continue pain clinic / neurology  follow-up as needed  11) Follow-up in 4 months, +/- imaging based on tumor board discussion. The patient was encouraged to call with any issues or questions before then, and to continue following with medical oncology and ENT. _____________________________________   Lonie Peak, MD

## 2012-06-27 ENCOUNTER — Telehealth (HOSPITAL_COMMUNITY): Payer: Self-pay | Admitting: Dentistry

## 2012-06-27 NOTE — Telephone Encounter (Signed)
Multiple messages have been left on the patient's phone concerning need for postradiation therapy periodic oral examination. Patient never returned the phone calls, nor did he followup with dental periodic oral examination with dental medicine. Dr. Waynetta Sandy Borden's office (primary Dentist) was contacted today and the patient has been seen in followup for an exam and cleaning on 03/02/2012. Dr. Vevelyn Royals office is to call if additional dental records are needed for the patient. Dr. Kristin Bruins

## 2012-06-28 ENCOUNTER — Telehealth: Payer: Self-pay | Admitting: *Deleted

## 2012-06-28 ENCOUNTER — Other Ambulatory Visit: Payer: Self-pay | Admitting: Radiation Oncology

## 2012-06-28 DIAGNOSIS — C770 Secondary and unspecified malignant neoplasm of lymph nodes of head, face and neck: Secondary | ICD-10-CM

## 2012-06-28 NOTE — Telephone Encounter (Signed)
Called patient to inform of test and labs, lvm for a return call.

## 2012-06-30 ENCOUNTER — Ambulatory Visit: Payer: Managed Care, Other (non HMO) | Admitting: Physical Therapy

## 2012-07-03 ENCOUNTER — Encounter: Payer: Self-pay | Admitting: Physician Assistant

## 2012-07-03 ENCOUNTER — Ambulatory Visit (HOSPITAL_BASED_OUTPATIENT_CLINIC_OR_DEPARTMENT_OTHER): Payer: Managed Care, Other (non HMO) | Admitting: Physician Assistant

## 2012-07-03 ENCOUNTER — Telehealth: Payer: Self-pay | Admitting: Oncology

## 2012-07-03 ENCOUNTER — Other Ambulatory Visit (HOSPITAL_BASED_OUTPATIENT_CLINIC_OR_DEPARTMENT_OTHER): Payer: Managed Care, Other (non HMO) | Admitting: Lab

## 2012-07-03 VITALS — BP 110/67 | HR 63 | Temp 97.4°F | Resp 20 | Ht 73.0 in | Wt 217.1 lb

## 2012-07-03 DIAGNOSIS — C770 Secondary and unspecified malignant neoplasm of lymph nodes of head, face and neck: Secondary | ICD-10-CM

## 2012-07-03 DIAGNOSIS — C109 Malignant neoplasm of oropharynx, unspecified: Secondary | ICD-10-CM

## 2012-07-03 DIAGNOSIS — C760 Malignant neoplasm of head, face and neck: Secondary | ICD-10-CM

## 2012-07-03 LAB — COMPREHENSIVE METABOLIC PANEL (CC13)
ALT: 23 U/L (ref 0–55)
AST: 22 U/L (ref 5–34)
Alkaline Phosphatase: 67 U/L (ref 40–150)
Creatinine: 0.8 mg/dL (ref 0.7–1.3)
Total Bilirubin: 0.77 mg/dL (ref 0.20–1.20)

## 2012-07-03 LAB — CBC WITH DIFFERENTIAL/PLATELET
BASO%: 0.8 % (ref 0.0–2.0)
EOS%: 2.8 % (ref 0.0–7.0)
HCT: 40.3 % (ref 38.4–49.9)
LYMPH%: 9.6 % — ABNORMAL LOW (ref 14.0–49.0)
MCH: 31.2 pg (ref 27.2–33.4)
MCHC: 34.5 g/dL (ref 32.0–36.0)
NEUT%: 76 % — ABNORMAL HIGH (ref 39.0–75.0)
Platelets: 135 10*3/uL — ABNORMAL LOW (ref 140–400)
lymph#: 0.3 10*3/uL — ABNORMAL LOW (ref 0.9–3.3)

## 2012-07-03 NOTE — Telephone Encounter (Signed)
gv pt appt schedule for April.  °

## 2012-07-03 NOTE — Progress Notes (Signed)
ID: Steven Robinson   DOB: 06-05-1951  MR#: [id number]  NGE#:[id number]  PCP: Herb Grays, MD  SU: Violeta Gelinas MD; Suzanna Obey MD  OTHER MD: Lonie Peak, Levert Feinstein, Shirlean Kelly    HISTORY OF PRESENT ILLNESS:   HPI: The patient noted an enlarging left neck mass early March 2013, and brought this to the attention of his PCP, Dr. Dewain Penning. She obtained a neck CT at [facility] 08/20/2011, which showed multiple enlarged nodes in the left mid-neck along the jugular v., the largest 2.0 cm. She referred the patient to Suzanna Obey in ENT and FNA of one of the enlarged nodes was positive for squamous cell carcinoma ([id number]). A p16 stain was read as equivocal. PET scan 09/03/2011 showed SUVs >6 in the left oropharynx and left jugular chain nodes; SUV of 5.49 was noted in the larynx. There was no evidence of contralateral or disseminated disease. This was consistent with a clinical T1 N2,stage IVA left oropharyngeal SCCA. However multiple biopsies under Dr. Jearld Fenton 09/09/2011 [[id number]] were all negative. The pathologic staging therefore is TX N2, which impacts radiation planning.  The patient met with Drs. Cruzita Lederer, and IMRT with concurrent chemosensitization was planned. The patient had dental evaluation under Dr. Kristin Bruins, several extractions under Dr. Chales Salmon, had audiometry (showing mild high-frequency hearing loss) and met with nutrition. A PEG tube placed by IR unfortunately became dislodged and infected, and had to be replaced through an open procedure under Violeta Gelinas. This delayed the start of chemotherapy and caused a change in plan from the original CDDP at 100 mg/m2 Q3w to 40 mg/M2 weekly. Radiation was started 10/04/2011 and chemotherapy 10/25/2011. The patient's subsequent history is as detailed below.  Last PET scan on 06/12/12 was negative for acute metastatic findings. There was some residual scar tissue on the neck area, which is being evaluated by Dr. Basilio Cairo. He is scheduled for neck  biopsy on 09/2012 for further delineation.   I  NTERVAL HISTORY:  Steven Robinson returns today with his wife Steven Robinson for followup of his squamous cell carcinoma of the oropharynx. He continues to be seen by neurology for his diagnosis of radiation myelopathy. He has discontinued naproxen, and is now having discontinued his Dilaudid  And decreased his Duragesic patch to 25 mcg/hour  Q 3 days.  REVIEW OF SYSTEMS:  Steven Robinson denies any fevers or chills. His appetite is improved, taking 6 Boost a day plus having increased the intake of soft foods. No nausea, emesis, or change in bowel habits. No oral ulcerations, sensitivity, or problems swallowing.His mouth is still dry. He denies any cough, shortness of breath, or chest pain. No abnormal headaches or dizziness. He still has pain in the right hand although, as noted above, this is currently well-controlled.  A detailed review of systems is otherwise noncontributory.     REVIEW OF SYSTEMS:  PAST MEDICAL HISTORY: Past Medical History  Diagnosis Date  . Hyperlipoproteinemia   . Obstructive sleep apnea     on C-pap.   [name] GERD (gastroesophageal reflux disease)     chronic  . Hx of colonic polyps   . 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 Vertigo     2/2 intersitial neuritis.   [name] Hearing loss of left ear   . BPH (benign prostatic hyperplasia)   . Hyperlipidemia   . Mucositis 11/01/2011  . S/P radiation therapy 10/04/11-11/22/11    Mucosal Axis/Neck:7000cGy/35Fractions  . Status post chemotherapy     CDDP During with Radiation  therapy  . Gastrostomy tube dependent     removed Oct. 2013  . 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 Neuromuscular disorder      Radiation Myelopathy  . History of EKG     done at PCP office, T. SpearsMay 28, 2012    PAST SURGICAL HISTORY: Past Surgical History  Procedure Date  . Fine needle aspiration 08/26/11     LEFT NECK FNA, LYMPH NODE, METASTATIC SQUAMOUS CELL CARCINOMA  . Gastrostomy 09/24/2011    Procedure: GASTROSTOMY;   Surgeon: Liz Malady, MD;  Location: [facility] OR;  Service: General;  Laterality: N/A;  open G-tube placement procedure start @1219   . Rigid esophagoscopy 09/24/2011    Procedure: RIGID ESOPHAGOSCOPY;  Surgeon: Suzanna Obey, MD;  Location: [facility] OR;  Service: ENT;  Laterality: N/A;  Esophagoscopy procedure end @1210   . Hernia repair 1990    Left  . Diagnostic laparoscopy     g-tube replacement   . Ventral hernia repair 04/24/2012    Procedure: LAPAROSCOPIC VENTRAL HERNIA;  Surgeon: Liz Malady, MD;  Location: [facility] OR;  Service: General;  Laterality: N/A;  laparoscopic repair incisional hernia with mesh  . Inguinal hernia repair 04/24/2012    Procedure: HERNIA REPAIR INGUINAL ADULT;  Surgeon: Liz Malady, MD;  Location: [facility] OR;  Service: General;  Laterality: Right;  . Insertion of mesh 04/24/2012    Procedure: INSERTION OF MESH;  Surgeon: Liz Malady, MD;  Location: [facility] OR;  Service: General;  Laterality: Right;    FAMILY HISTORY Family History  Problem Relation Age of Onset  . Alzheimer's disease Mother   . Tics Mother   . Hypertension Father   . Cancer Father 26    Prostate  . Stroke Paternal Grandmother   . Cancer Paternal Grandfather     Lung  and Prostate   the patient's father is alive at age 13. He lives in a retirement community. The patient's mother died at the age of 76 in 11-08-09; she had significant Alzheimer's disease. The patient is an only child.   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 His wife Steven Robinson is a Music therapist in oil. The live on 8 acres and have 2 large horses, a donkey, a Engineer, manufacturing, chickens, etc. The patient's daughter Steven Robinson lives in [place] and is currently looking for a job. The patient's son Steven Robinson works for an Economist. The patient has 2 grandchildren, granddaughter age 47 and a grandson aged 8 months. He attends the [employer].   ADVANCED DIRECTIVES: in place    HEALTH MAINTENANCE: History  Substance Use Topics  . Smoking status: Former Smoker -- 0.2 packs/day for 3 years    Quit date: 06/14/1972  . Smokeless tobacco: Not on file  . Alcohol Use: No     Comment: Hx of Drinking wine - Stopped 3 years ago     Colonoscopy:  PAP:  Bone density:  Lipid panel:  Allergies  Allergen Reactions  . Cyclobenzaprine Hcl Other (See Comments)    confusion    Current Outpatient Prescriptions  Medication Sig Dispense Refill  . Artificial Saliva (AQUORAL) AERS Use as directed 2 sprays in the mouth or throat 4 (four) times daily as needed.  1 Can  5  . aspirin EC 81 MG tablet Take 81 mg by mouth daily.      [name] dexlansoprazole (DEXILANT) 60 MG capsule Take 60 mg by mouth at  bedtime.       . diphenhydrAMINE (BENADRYL) 25 mg capsule Take 25 mg by mouth every 6 (six) hours as needed.      . docusate sodium (COLACE) 100 MG capsule Take 100 mg by mouth 3 (three) times daily.      . fentaNYL (DURAGESIC - DOSED MCG/HR) 50 MCG/HR Place 1 patch onto the skin every 3 (three) days.      [name] HYDROmorphone (DILAUDID) 4 MG tablet Take 1-2 tablets (4-8 mg total) by mouth every 4 (four) hours as needed for pain.  50 tablet  0  . lactose free nutrition (BOOST PLUS) LIQD Take 237 mLs by mouth 3 (three) times daily with meals. 6-8 cans per day      . NASONEX 50 MCG/ACT nasal spray Place 2 sprays into the nose daily as needed.       . pilocarpine (SALAGEN) 5 MG tablet Take 1 tablet (5 mg total) by mouth 3 (three) times daily.  90 tablet  6  . polyethylene glycol (MIRALAX / GLYCOLAX) packet Take 17 g by mouth 2 (two) times daily.      . pregabalin (LYRICA) 100 MG capsule Take 100 mg by mouth 3 (three) times daily.      . rosuvastatin (CRESTOR) 40 MG tablet Take 20 mg by mouth at bedtime.       . Tamsulosin HCl (FLOMAX) 0.4 MG CAPS Take 0.4 mg by mouth at bedtime.       [name] zolpidem (AMBIEN) 10 MG tablet Take 10 mg by mouth  at bedtime.         OBJECTIVE: Filed Vitals:   07/03/12 0938  BP: 110/67  Pulse: 63  Temp: 97.4 F (36.3 C)  Resp: 20     Body mass index is 28.64 kg/(m^2).    ECOG FS:   GENERAL: Well developed, well nourished, in no acute distress.  EENT: No ocular or oral lesions. No stomatitis.  RESPIRATORY: Lungs are clear to auscultation bilaterally, no wheezing, rhonchi or rales. CARDIAC: regular rate and rhythm, no murmurs, rubs or gallops. GI: well-healed incisions status post hernia repair, nontender to palpation, positive bowel sounds Musculoskeletal:No focal spinal tenderness, no peripheral edema. Sesamoid bones in the R hand are somewhat tender.  Lymph: No cervical, infraclavicular, axillary or inguinal adenopathy. Neuro: No focal neurological deficits. Psych: Alert and oriented X 3, appropriate mood and affect.     LAB RESULTS: Lab Results  Component Value Date   WBC 3.3* 07/03/2012   NEUTROABS 2.5 07/03/2012   HGB 13.9 07/03/2012   HCT 40.3 07/03/2012   MCV 90.4 07/03/2012   PLT 135* 07/03/2012      Chemistry      Component Value Date/Time   NA 137 04/21/2012 1127   NA 136 02/28/2012 1425   K 4.3 04/21/2012 1127   K 3.8 02/28/2012 1425   CL 99 04/21/2012 1127   CL 102 02/28/2012 1425   CO2 30 04/21/2012 1127   CO2 25 02/28/2012 1425   BUN 17 04/21/2012 1127   BUN 15.0 02/28/2012 1425   CREATININE 0.73 04/21/2012 1127   CREATININE 0.8 02/28/2012 1425      Component Value Date/Time   CALCIUM 9.5 04/21/2012 1127   CALCIUM 9.4 02/28/2012 1425   ALKPHOS 58 02/28/2012 1425   ALKPHOS 62 12/13/2011 0950   AST 18 02/28/2012 1425   AST 18 12/13/2011 0950   ALT 21 02/28/2012 1425   ALT 13 12/13/2011 0950   BILITOT 0.80 02/28/2012 1425  BILITOT 0.4 12/13/2011 0950        STUDIES: Nm Pet Image Restag (ps) Skull Base To Thigh  06/12/2012  *RADIOLOGY REPORT*  Clinical Data: Subsequent treatment strategy for head and neck cancer.  NUCLEAR MEDICINE PET SKULL BASE TO THIGH  Fasting Blood  Glucose:  104  Technique:  19.2 mCi F-18 FDG was injected intravenously. CT data was obtained and used for attenuation correction and anatomic localization only.  (This was not acquired as a diagnostic CT examination.) Additional exam technical data entered on technologist worksheet.  Comparison:  02/24/2012  Findings:  Neck: No hypermetabolic lymph nodes in the neck.  Chest:  No hypermetabolic mediastinal or hilar nodes.  No suspicious pulmonary nodules on the CT scan.  Abdomen/Pelvis:  No abnormal hypermetabolic activity within the liver, pancreas, adrenal glands, or spleen.  No hypermetabolic lymph nodes in the abdomen or pelvis. Interval postsurgical change along the undersurface of the ventral abdominal wall and the right inguinal region.  There is a moderate amount of free fluid within the dependent portion of the pelvis.  Skeleton:  No focal hypermetabolic activity to suggest skeletal metastasis.  IMPRESSION:  1.  There is no evidence for residual or recurrent hypermetabolic tumor. 2. Postsurgical change from hernia repair.  There is free fluid within the dependent portion of the pelvis, likely related to surgery.   Original Report Authenticated By: Signa Kell, M.D.     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 Possible biopsy scheduled on 10/2011 to evaluate scar tissue in the neck region. Will continue to follow the results.  (1) risk factors are smoking history (minimal and remote), significant alcohol use (discontinued 2010), and an equivocal p16 probe   (2) dizziness, possibly due partly to autonomic dysfunction from cis-platinum, resolved  (3) malnutrition: He is maintaining his weight on his current diet . Add  multivitamins daily.  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   (7) right inguinal hernia/ventral hernia, repaired 04/24/2012   PLAN:   Angelina continues to recover well. He will continue with physical therapy/speech therapy as planned. He'll continue on his current pain regimen.  He is return to our clinic in 3 months for a follow up visit with labs.  All this was reviewed in detail, and he voices understanding and agreement with this plan. She will call with any changes or problems.     Marlowe Kays E    07/03/2012

## 2012-07-06 ENCOUNTER — Ambulatory Visit: Payer: Managed Care, Other (non HMO) | Admitting: Physical Therapy

## 2012-07-10 ENCOUNTER — Encounter: Payer: Self-pay | Admitting: Physical Therapy

## 2012-07-10 ENCOUNTER — Ambulatory Visit: Payer: Managed Care, Other (non HMO) | Admitting: Occupational Therapy

## 2012-07-13 ENCOUNTER — Ambulatory Visit: Payer: Managed Care, Other (non HMO)

## 2012-07-17 ENCOUNTER — Ambulatory Visit: Payer: Managed Care, Other (non HMO) | Attending: Radiation Oncology

## 2012-07-17 DIAGNOSIS — R1313 Dysphagia, pharyngeal phase: Secondary | ICD-10-CM | POA: Insufficient documentation

## 2012-07-17 DIAGNOSIS — IMO0001 Reserved for inherently not codable concepts without codable children: Secondary | ICD-10-CM | POA: Insufficient documentation

## 2012-07-20 ENCOUNTER — Ambulatory Visit: Payer: Managed Care, Other (non HMO) | Admitting: *Deleted

## 2012-07-21 ENCOUNTER — Ambulatory Visit: Payer: Managed Care, Other (non HMO) | Admitting: *Deleted

## 2012-07-24 ENCOUNTER — Ambulatory Visit: Payer: Managed Care, Other (non HMO) | Admitting: Physical Therapy

## 2012-07-24 ENCOUNTER — Ambulatory Visit: Payer: Managed Care, Other (non HMO) | Admitting: *Deleted

## 2012-07-27 ENCOUNTER — Ambulatory Visit: Payer: Managed Care, Other (non HMO) | Admitting: Occupational Therapy

## 2012-07-28 ENCOUNTER — Encounter: Payer: Self-pay | Admitting: Occupational Therapy

## 2012-07-31 ENCOUNTER — Ambulatory Visit: Payer: Managed Care, Other (non HMO) | Admitting: Occupational Therapy

## 2012-07-31 ENCOUNTER — Ambulatory Visit: Payer: Managed Care, Other (non HMO) | Admitting: Physical Therapy

## 2012-07-31 ENCOUNTER — Encounter: Payer: Self-pay | Admitting: Physical Therapy

## 2012-08-03 ENCOUNTER — Ambulatory Visit: Payer: Managed Care, Other (non HMO) | Admitting: Occupational Therapy

## 2012-08-07 ENCOUNTER — Ambulatory Visit: Payer: Managed Care, Other (non HMO) | Admitting: Physical Therapy

## 2012-08-07 ENCOUNTER — Ambulatory Visit: Payer: Managed Care, Other (non HMO) | Admitting: Occupational Therapy

## 2012-08-07 ENCOUNTER — Other Ambulatory Visit: Payer: Self-pay | Admitting: Physician Assistant

## 2012-08-09 ENCOUNTER — Telehealth: Payer: Self-pay | Admitting: Oncology

## 2012-08-09 NOTE — Telephone Encounter (Signed)
spoke with Clyde gave appt d/t. I also sent him an email of appt d/t...td

## 2012-08-10 ENCOUNTER — Ambulatory Visit: Payer: Managed Care, Other (non HMO) | Admitting: Occupational Therapy

## 2012-08-11 ENCOUNTER — Other Ambulatory Visit: Payer: Self-pay | Admitting: *Deleted

## 2012-08-11 ENCOUNTER — Telehealth: Payer: Self-pay | Admitting: Nutrition

## 2012-08-11 NOTE — Telephone Encounter (Signed)
I returned patient's call however he was not available. I left a message to call me back if needed.

## 2012-08-14 ENCOUNTER — Ambulatory Visit: Payer: Managed Care, Other (non HMO) | Attending: Radiation Oncology | Admitting: Physical Therapy

## 2012-08-14 DIAGNOSIS — R1313 Dysphagia, pharyngeal phase: Secondary | ICD-10-CM | POA: Insufficient documentation

## 2012-08-14 DIAGNOSIS — IMO0001 Reserved for inherently not codable concepts without codable children: Secondary | ICD-10-CM | POA: Insufficient documentation

## 2012-08-17 ENCOUNTER — Ambulatory Visit: Payer: Managed Care, Other (non HMO) | Admitting: Occupational Therapy

## 2012-08-21 ENCOUNTER — Ambulatory Visit: Payer: Managed Care, Other (non HMO) | Admitting: Physical Therapy

## 2012-08-24 ENCOUNTER — Ambulatory Visit: Payer: Managed Care, Other (non HMO) | Admitting: Occupational Therapy

## 2012-08-28 ENCOUNTER — Encounter: Payer: Self-pay | Admitting: Physical Therapy

## 2012-08-31 ENCOUNTER — Encounter: Payer: Self-pay | Admitting: Neurology

## 2012-08-31 DIAGNOSIS — G959 Disease of spinal cord, unspecified: Secondary | ICD-10-CM | POA: Insufficient documentation

## 2012-08-31 DIAGNOSIS — M5412 Radiculopathy, cervical region: Secondary | ICD-10-CM | POA: Insufficient documentation

## 2012-09-04 ENCOUNTER — Ambulatory Visit (INDEPENDENT_AMBULATORY_CARE_PROVIDER_SITE_OTHER): Payer: Managed Care, Other (non HMO) | Admitting: Neurology

## 2012-09-04 ENCOUNTER — Ambulatory Visit: Payer: Managed Care, Other (non HMO) | Admitting: Occupational Therapy

## 2012-09-04 ENCOUNTER — Encounter: Payer: Self-pay | Admitting: Neurology

## 2012-09-04 ENCOUNTER — Encounter: Payer: Self-pay | Admitting: Physical Therapy

## 2012-09-04 VITALS — BP 106/70 | HR 67 | Ht 71.0 in | Wt 208.0 lb

## 2012-09-04 DIAGNOSIS — G959 Disease of spinal cord, unspecified: Secondary | ICD-10-CM

## 2012-09-04 DIAGNOSIS — R200 Anesthesia of skin: Secondary | ICD-10-CM | POA: Insufficient documentation

## 2012-09-04 DIAGNOSIS — R209 Unspecified disturbances of skin sensation: Secondary | ICD-10-CM

## 2012-09-04 DIAGNOSIS — R202 Paresthesia of skin: Secondary | ICD-10-CM

## 2012-09-04 MED ORDER — PREGABALIN 100 MG PO CAPS: 100.0000 mg | ORAL_CAPSULE | Freq: Four times a day (QID) | ORAL | Status: DC

## 2012-09-04 NOTE — Progress Notes (Signed)
Steven Robinson is a 62 years old right-handed Caucasian male, referred by his primary care physician Dr. Herb Grays, orthopedic surgeon Dr. Roe Coombs and Radiation Oncologist Dr. Basilio Cairo, Steven Robinson  for evaluation of right arm pain  He had a past medical history of left cervical squamous cell cancer, he underwent radiation from April to June 2013, completed 70 Gy/35 fractions on 11-22-11 with concurrent cis-platinum He has to receive PEG tube placement because of dry mouth, difficulty swallowing, he's now taking Ensure boost, and soft food by mouth,  Since early August 2013, he began to experience sharp shooting pain at his right scapular region, later he began to notice numbness tingling at right third, fourth, fifth fingers, hypersensitivity, radiating discomfort at the right ulnar forearm, arm, armpit, a strip across his anterior chest, now began to involving his left armpit, recent couple weeks, also involving right plantar feet, heel.  He has taken gabapentin without helping him, he is now taking Dilaudid, naproxen, only provide limited help,   He denies gait difficulty, no incontinence  MRI cervical at [facility] orthopedic surgeon showed no cervical cord, or intraspinal lesions, C4-5, severe right foraminal stenosis with abutment of right C5 nerve roots, C5 and 6, small central protrusion, and osteophytes, with mild flattening of the ventral thecal sac and minimal flattening of the ventral cord  His right arm pain has much improved at last visit in 03/2012, with fentanyl patch, Lyrica, Dilaudid,  he complains of GI side effect with higher dose of fentanyl 75 mcg, he is only taking 25 mcg every 3 days now, MRI thoracic spine was normal. He has mild right hand muscle weakness, hypersensitivity of right fourth fifth fingers, medium forearm, arm, less sensitive at anterior chest, no symptoms in his left armpit anymore  UPDATE March 24th 2014: Last clinical visit was in October 20 first 2013, he since has been  weaned off the Dilaudid, fentanyl patch, he is only taking Lyrica 100 mg 3 times a day, his paresthesia and pain overall has much improved, only limited to his right hand, ulnar side, below the wrist crease, he also noticed mild right hand muscle atrophy, weakness, he denies neck pain, he has right first finger contraction, consistent with right Dupuytren's finger.  I have talked with his radiation oncologist Dr. Lonie Peak, who noticed he has mild left ptosis, missed Riess report few years history of mild left droopy eye lid, he was considering left oculoplastic surgery prior to the diagnosis of cervical cancer, the left droopy eyelid was also present on his driver's license dated September 2012.  He denies gait difficulty, continued to have dry mouth left worse than right,  PET scan in December 2013 was reported normal, no evidence of hypermetabolic activity. CAT scan of neck soft tissue was planned for May 2014  Review of Systems  Out of a complete 14 system review, the patient complains of only the following symptoms, and all other reviewed systems are negative.   Constitutional:   Weight loss Cardiovascular:  N/A Ear/Nose/Throat:  N/A Skin: N/A Eyes: N/A Respiratory: N/A Gastroitestinal: N/A    Hematology/Lymphatic:  N/A Musculoskeletal:N/A Endocrine:  N/A Neurological: Numbness, weakness, difficulty swallowing, dry mouth. Psychiatric:    N/A    Physical Exam  Neck: supple no carotid bruits, discolored hard left cervical muscle ,  Respiratory: clear to auscultation bilaterally Cardiovascular: regular rate rhythm  Neurologic Exam  Mental Status: pleasant, awake, alert, cooperative to history, talking, and casual conversation. Cranial Nerves: CN II-XII pupils were equal round reactive to light.  Fundi were sharp bilaterally. MILD STATIC LEFT PTOSIS, TO THE UPPER EDGE OF HIS left pupil. Extraocular movements were full.  Visual fields were full on confrontational test.  Facial  sensation and strength were normal.  Hearing was intact to finger rubbing bilaterally.  Uvula tongue were midline.  Head turning and shoulder shrugging were normal and symmetric.  Tongue protrusion into the cheeks strength were normal.  Motor: mild right hand intrinsic muscle atrophy, mild right hand grip, finger abduction,  Weakness. Contraction of right 5th finger, most c/w dupuytren finger. Sensory: Normal to light touch, pinprick, proprioception, and vibratory sensation, with exception of allodynia at right 3-5th fingers, inner forearm, arm, anterior chest,  Coordination: Normal finger-to-nose, heel-to-shin.  There was no dysmetria noticed. Gait and Station: Narrow based and steady, was able to perform tiptoe, heel, and tandem walking without difficulty.  Romberg sign: Negative Reflexes: Deep tendon reflexes: Biceps: 2/2+, Brachioradialis: 2/2+, Triceps: 2/2+, Pateller: 2/2, Achilles: 2/2.  Plantar responses are flexor.   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  1. Most suggestive of a mild residual right brachial plexopathy involving lower trunk, differentiation diagnosis also including right cervical radiculopathy, right ulnar neuropathy . 2.he had static left ptosis, the same side he had left cervical mass, but his left ptosis was present before the diagnosis, no pupillary defect, no ipsilateral  anhydrosis to support a left Horner's syndrome, likely mild asymmetry due to excessive tissues. 3. EMG/NCS. 4. Lyrica 100 mg 4 times a day new prescription was written

## 2012-09-11 ENCOUNTER — Ambulatory Visit: Payer: Managed Care, Other (non HMO) | Admitting: Physical Therapy

## 2012-09-11 ENCOUNTER — Encounter: Payer: Self-pay | Admitting: Occupational Therapy

## 2012-09-11 ENCOUNTER — Encounter: Payer: Self-pay | Admitting: Physical Therapy

## 2012-09-18 ENCOUNTER — Encounter: Payer: Self-pay | Admitting: Physical Therapy

## 2012-09-25 ENCOUNTER — Encounter: Payer: Self-pay | Admitting: *Deleted

## 2012-09-26 ENCOUNTER — Telehealth (HOSPITAL_COMMUNITY): Payer: Self-pay | Admitting: Dental General Practice

## 2012-09-26 NOTE — Telephone Encounter (Signed)
Patient called to say that he has a spot on the lower right side of his mouth that needed evaluation Patient is being followed by Dr. Marchia Bond. Dr. Laverle Patter is aware of the affected area.  Patient was instructed to call and  follow up with Dr. Laverle Patter for re -evaluation of the area. Patient to have Dr. Laverle Patter call if she would like Dr. Kristin Bruins to evaluate the area.   djs

## 2012-09-29 ENCOUNTER — Encounter (HOSPITAL_COMMUNITY): Payer: Self-pay | Admitting: Dentistry

## 2012-09-29 ENCOUNTER — Ambulatory Visit (HOSPITAL_COMMUNITY): Payer: Self-pay | Admitting: Dentistry

## 2012-09-29 VITALS — BP 115/76 | HR 55 | Temp 97.6°F

## 2012-09-29 DIAGNOSIS — Z09 Encounter for follow-up examination after completed treatment for conditions other than malignant neoplasm: Secondary | ICD-10-CM

## 2012-09-29 DIAGNOSIS — Z85819 Personal history of malignant neoplasm of unspecified site of lip, oral cavity, and pharynx: Secondary | ICD-10-CM

## 2012-09-29 HISTORY — PX: OTHER SURGICAL HISTORY: SHX169

## 2012-09-29 MED ORDER — CHLORHEXIDINE GLUCONATE 0.12 % MT SOLN: OROMUCOSAL | Status: DC

## 2012-09-29 MED ORDER — SODIUM FLUORIDE 1.1 % DT CREA: TOPICAL_CREAM | DENTAL | Status: AC

## 2012-09-29 NOTE — Patient Instructions (Addendum)
Rinse with chlorhexidine prescription rinses after breakfast and at bedtime. Use salt water rinses every 2 hours while awake in between the chlorhexidine rinses. Use fluoride in the fluoride trays or the PreviDent 5000 prescription strength toothpaste at bedtime as directed. Followup with Dr. Marchia Bond for periodontal recall and discussion of permanent replacement of missing tooth #19 as indicated. Consider referral to Dr. Cherly Anderson for discussion of possible implant therapy in conjunction with Dr. Laverle Patter if so indicated.  Charlynne Pander, DDS

## 2012-09-29 NOTE — Progress Notes (Signed)
09/29/2012  Patient:            Steven Robinson Date of Birth:  Sep 12, 1950 MRN:                [id number]  BP 115/76  Pulse 55  Temp(Src) 97.6 F (36.4 C) (Oral)  [name] is a 62 year old male that presents for periodic oral exam and evaluation of exposed bone involving the mandibular left alveolar ridge area number 17.  Medical Hx Update:  Past Medical History  Diagnosis Date  . Hyperlipoproteinemia   . Obstructive sleep apnea     on C-pap.   [name] GERD (gastroesophageal reflux disease)     chronic  . Hx of colonic polyps   . 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 Vertigo     2/2 intersitial neuritis.   [name] Hearing loss of left ear   . BPH (benign prostatic hyperplasia)   . Hyperlipidemia   . Mucositis 11/01/2011  . S/P radiation therapy 10/04/11-11/22/11    Mucosal Axis/Neck:7000cGy/35Fractions  . Status post chemotherapy     CDDP During with Radiation therapy  . Gastrostomy tube dependent     removed Oct. 2013  . 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 Neuromuscular disorder      Radiation Myelopathy  . History of EKG     done at PCP office, T. Spears- 2012  . High cholesterol    Past Surgical History  Procedure Laterality Date  . Fine needle aspiration  08/26/11     LEFT NECK FNA, LYMPH NODE, METASTATIC SQUAMOUS CELL CARCINOMA  . Gastrostomy  09/24/2011    Procedure: GASTROSTOMY;  Surgeon: Liz Malady, MD;  Location: [facility] OR;  Service: General;  Laterality: N/A;  open G-tube placement procedure start @1219   . Rigid esophagoscopy  09/24/2011    Procedure: RIGID ESOPHAGOSCOPY;  Surgeon: Suzanna Obey, MD;  Location: [facility] OR;  Service: ENT;  Laterality: N/A;  Esophagoscopy procedure end @1210   . Hernia repair  1990    Left  . Diagnostic laparoscopy      g-tube replacement   . Ventral hernia repair  04/24/2012    Procedure: LAPAROSCOPIC VENTRAL HERNIA;  Surgeon: Liz Malady, MD;  Location: [facility] OR;  Service: General;  Laterality: N/A;  laparoscopic repair incisional  hernia with mesh  . Inguinal hernia repair  04/24/2012    Procedure: HERNIA REPAIR INGUINAL ADULT;  Surgeon: Liz Malady, MD;  Location: [facility] OR;  Service: General;  Laterality: Right;  . Insertion of mesh  04/24/2012    Procedure: INSERTION OF MESH;  Surgeon: Liz Malady, MD;  Location: [facility] OR;  Service: General;  Laterality: Right;    ALLERGIES/ADVERSE DRUG REACTIONS: Allergies  Allergen Reactions  . Cyclobenzaprine Hcl Other (See Comments)    confusion  . Flexeril (Cyclobenzaprine) Anxiety   MEDICATIONS: Current outpatient prescriptions:Artificial Saliva (AQUORAL) AERS, Use as directed 2 sprays in the mouth or throat 4 (four) times daily as needed., Disp: 1 Can, Rfl: 5;  aspirin EC 81 MG tablet, Take 81 mg by mouth daily., Disp: , Rfl: ;  chlorhexidine (PERIDEX) 0.12 % solution, Rinse with 15 mls twice daily for 30 seconds. Use after breakfast and at bedtime. Spit out excess. Do not swallow., Disp: 480 mL, Rfl: 2 dexlansoprazole (DEXILANT) 60 MG capsule, Take 60 mg by mouth at bedtime. , Disp: , Rfl: ;  diphenhydrAMINE (BENADRYL) 25 mg capsule, Take 25 mg by mouth every 6 (six) hours as needed., Disp: , Rfl: ;  docusate sodium (COLACE) 100 MG capsule, Take 100 mg by mouth 3 (three) times daily., Disp: , Rfl: ;  NASONEX 50 MCG/ACT nasal spray, Place 2 sprays into the nose daily as needed. , Disp: , Rfl:  pregabalin (LYRICA) 100 MG capsule, Take 1 capsule (100 mg total) by mouth 4 (four) times daily., Disp: 360 capsule, Rfl: 3;  rosuvastatin (CRESTOR) 40 MG tablet, Take 40 mg by mouth at bedtime. , Disp: , Rfl: ;  sodium fluoride (PREVIDENT 5000 PLUS) 1.1 % CREA dental cream, Apply thin ribbon of cream to tooth brush. Brush teeth for 2 minutes. Spit out excess-DO NOT swallow. DO NOT rinse afterwards. Repeat nightly., Disp: 1 Tube, Rfl: prn Tamsulosin HCl (FLOMAX) 0.4 MG CAPS, Take 0.4 mg by mouth at bedtime. , Disp: , Rfl: ;  zolpidem (AMBIEN) 10 MG tablet, Take 10 mg by mouth at bedtime. ,  Disp: , Rfl:   C/C: Patient is complaining of exposed bone involving the left mandible. HPI:  [name] provided a history of having a flexible partial denture insert approximately 5 weeks ago. Patient subsequently experienced some irritation to that area with eventual exposed bone noted. Patient was evaluated by Dr. Marchia Bond approximately 3 weeks ago.  Patient with persistent exposed bone and patient requested evaluation by dental medicine and Dr. Vevelyn Royals absence.   DENTAL EXAM: General: Patient is a well-developed, well-nourished male in no acute distress. Vitals: As above. Extraoral Exam: No palpable lymphadenopathy.  Patient denies TMJ Symptoms but is wearing maxillary occlusal night guard at bedtime. Intraoral  Exam: Xerostomia is noted. There is a 3 x 7 mm area of exposed bone involving the alveolar ridge area numbers 17. Dentition: As before. Caries: No obvious dental caries are noted. Endodontic: The patient denies  pulpitits symptoms.  C&B: No new crowns noted. Prosthodontic: Patient with recent insertion of a mandibular flexite partial replacing tooth #19. Occlusion: Stable  Assessments: 1. Bony sequestrum in the area of tooth #17. This measures 3 x 7 mm.  Plan:  1. I discussed the risks, benefits, and complications of various treatment options with the patient in relationship to his bony sequestrum area #17. The patient currently wishes to proceed with removal of the bony sequestrum today. Patient will then be given a prescription for chlorhexidine rinse to use on a twice daily as needed until the healing in that area has occurred. Patient may also use salt water rinses in between the chlorhexidine rinses as needed. Patient also wished to have a prescription for PreviDent 5000+ to increase compliance with fluoride use at bedtime. Patient instructed to use the fluoride in the fluoride trays as previously instructed but also may use the PreviDent 5000 Plus paste instead to  increase his compliance for fluoride use on a daily basis. We also discussed possible treatment options for her replacing tooth #19 to include possible implant therapy, cantilever bridge therapy, or continued use of partial denture, or no treatment at all. Patient will discuss options with Dr. Laverle Patter in the future.  Procedure: The patient was prepped and draped for a surgical procedure. Topical local anesthetic was applied to the area numbers 17 soft tissues around the bony sequestrum. The bony sequestrum was removed with a rongeur without complication. Area was irrigated with copious amounts sterile saline. Patient will be given a prescription for chlorhexidine rinse to use on a twice daily basis to aid in this infection of this mouth while healing. Patient to followup with Dr. Laverle Patter for continued evaluation of the healing or  may call dental medicine if problems arise with his healing in the future. Patient tolerated procedure well and was dismissed to the care of his wife after the procedure in good condition.  Charlynne Pander 09/29/2012

## 2012-10-02 ENCOUNTER — Other Ambulatory Visit: Payer: Self-pay | Admitting: Lab

## 2012-10-02 ENCOUNTER — Ambulatory Visit: Payer: Self-pay | Admitting: Physician Assistant

## 2012-10-03 ENCOUNTER — Other Ambulatory Visit: Payer: Self-pay | Admitting: Lab

## 2012-10-03 ENCOUNTER — Ambulatory Visit (HOSPITAL_BASED_OUTPATIENT_CLINIC_OR_DEPARTMENT_OTHER): Payer: Managed Care, Other (non HMO) | Admitting: Oncology

## 2012-10-03 ENCOUNTER — Telehealth: Payer: Self-pay | Admitting: Oncology

## 2012-10-03 ENCOUNTER — Ambulatory Visit: Payer: Self-pay | Admitting: Oncology

## 2012-10-03 ENCOUNTER — Other Ambulatory Visit (HOSPITAL_BASED_OUTPATIENT_CLINIC_OR_DEPARTMENT_OTHER): Payer: Managed Care, Other (non HMO)

## 2012-10-03 VITALS — BP 118/78 | HR 63 | Temp 97.5°F | Resp 20 | Ht 71.0 in | Wt 205.8 lb

## 2012-10-03 DIAGNOSIS — C760 Malignant neoplasm of head, face and neck: Secondary | ICD-10-CM

## 2012-10-03 DIAGNOSIS — C770 Secondary and unspecified malignant neoplasm of lymph nodes of head, face and neck: Secondary | ICD-10-CM

## 2012-10-03 DIAGNOSIS — C109 Malignant neoplasm of oropharynx, unspecified: Secondary | ICD-10-CM

## 2012-10-03 LAB — CBC WITH DIFFERENTIAL/PLATELET
BASO%: 0.9 % (ref 0.0–2.0)
EOS%: 3.1 % (ref 0.0–7.0)
HCT: 41.5 % (ref 38.4–49.9)
LYMPH%: 17.5 % (ref 14.0–49.0)
MCH: 29.4 pg (ref 27.2–33.4)
MCHC: 33 g/dL (ref 32.0–36.0)
NEUT%: 65.1 % (ref 39.0–75.0)
Platelets: 127 10*3/uL — ABNORMAL LOW (ref 140–400)

## 2012-10-03 LAB — COMPREHENSIVE METABOLIC PANEL (CC13)
ALT: 10 U/L (ref 0–55)
AST: 13 U/L (ref 5–34)
BUN: 8.6 mg/dL (ref 7.0–26.0)
CO2: 30 mEq/L — ABNORMAL HIGH (ref 22–29)
Creatinine: 0.8 mg/dL (ref 0.7–1.3)
Total Bilirubin: 0.95 mg/dL (ref 0.20–1.20)

## 2012-10-03 NOTE — Progress Notes (Signed)
ID: Steven Robinson   DOB: 1951/03/03  MR#: [id number]  NWG#:[id number]  PCP:  SU: Violeta Gelinas MD; Suzanna Obey MD OTHER MD: Lonie Peak, Levert Feinstein, Shirlean Kelly, Tamera Punt   HISTORY OF PRESENT ILLNESS: HPI: The patient noted an enlarging left neck mass early March 2013, and brought this to the attention of his PCP, Dr. Dewain Penning. She obtained a neck CT at [facility] 08/20/2011, which showed multiple enlarged nodes in the left mid-neck along the jugular v., the largest 2.0 cm. She referred the patient to Suzanna Obey in ENT and FNA of one of the enlarged nodes was positive for squamous cell carcinoma ([id number]). A p16 stain was read as equivocal. PET scan 09/03/2011 showed SUVs >6 in the left oropharynx and left jugular chain nodes; SUV of 5.49 was noted in the larynx. There was no evidence of contralateral or disseminated disease.  This was consistent with a clinical T1 N2,stage IVA left oropharyngeal SCCA. However multiple biopsies under Dr. Jearld Fenton 09/09/2011 [[id number]] were all negative. The pathologic staging therefore is TX N2, which impacts radiation planning.  The patient met with Drs. Cruzita Lederer, and IMRT with concurrent chemosensitization was planned. The patient had dental evaluation under Dr. Kristin Bruins, several extractions under Dr. Chales Salmon, had audiometry (showing mild high-frequency hearing loss) and met with nutrition. A PEG tube placed by IR unfortunately became dislodged and infected, and had to be replaced through an open procedure under Violeta Gelinas. This delayed the start of chemotherapy and caused a change in plan from the original CDDP at 100 mg/m2 Q3w to 40 mg/M2 weekly. Radiation was started 10/04/2011 and chemotherapy 10/25/2011. The patient's subsequent history is as detailed below.   INTERVAL HISTORY: Steven Robinson returns today with his wife [name] for followup of his squamous cell carcinoma of the oropharynx. He went back to work full-time January 2014. Initially he went "full tilt",  and found that working from 7 AM to 2 AM was a little much. He is trying to control stress and working no more than 12 hour days. There is still a lot of traveling-today for example he will be going to [place] shortly after our meeting. He walks 2 miles a day and it continues to do physical and occupational therapy particularly to improve his right upper extremity function  REVIEW OF SYSTEMS: He just had part of a sequestrum removed of by Dr. Kristin Bruins. That seems to be healing well. He is no longer using pilocarpine or mouthwashes, but drinks a lot of water with food and mostly eats soft foods. His PEG came out fall of 2013. He is maintaining his weight now with no boost. His right upper extremity neuropathy/plexopathy pain is controlled with Lyrica 4 times a day, no narcotics at present. He notices that his voice is a little husky her than it used to be. There have been no unusual headaches, visual changes, cough, phlegm production, pleurisy, or change in bowel or bladder habits. A detailed review of systems today was otherwise noncontributory   PAST MEDICAL HISTORY: Past Medical History  Diagnosis Date  . Hyperlipoproteinemia   . Obstructive sleep apnea     on C-pap.   [name] GERD (gastroesophageal reflux disease)     chronic  . Hx of colonic polyps   . 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 Vertigo     2/2 intersitial neuritis.   [name] Hearing loss of left ear   . BPH (benign prostatic hyperplasia)   . Hyperlipidemia   .  Mucositis 11/01/2011  . S/P radiation therapy 10/04/11-11/22/11    Mucosal Axis/Neck:7000cGy/35Fractions  . Status post chemotherapy     CDDP During with Radiation therapy  . Gastrostomy tube dependent     removed Oct. 2013  . 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 Neuromuscular disorder      Radiation Myelopathy  . History of EKG     done at PCP office, T. Spears12-May-2012  . High cholesterol     PAST SURGICAL HISTORY: Past Surgical History  Procedure Laterality Date  .  Fine needle aspiration  08/26/11     LEFT NECK FNA, LYMPH NODE, METASTATIC SQUAMOUS CELL CARCINOMA  . Gastrostomy  09/24/2011    Procedure: GASTROSTOMY;  Surgeon: Liz Malady, MD;  Location: [facility] OR;  Service: General;  Laterality: N/A;  open G-tube placement procedure start @1219   . Rigid esophagoscopy  09/24/2011    Procedure: RIGID ESOPHAGOSCOPY;  Surgeon: Suzanna Obey, MD;  Location: [facility] OR;  Service: ENT;  Laterality: N/A;  Esophagoscopy procedure end @1210   . Hernia repair  1990    Left  . Diagnostic laparoscopy      g-tube replacement   . Ventral hernia repair  04/24/2012    Procedure: LAPAROSCOPIC VENTRAL HERNIA;  Surgeon: Liz Malady, MD;  Location: [facility] OR;  Service: General;  Laterality: N/A;  laparoscopic repair incisional hernia with mesh  . Inguinal hernia repair  04/24/2012    Procedure: HERNIA REPAIR INGUINAL ADULT;  Surgeon: Liz Malady, MD;  Location: [facility] OR;  Service: General;  Laterality: Right;  . Insertion of mesh  04/24/2012    Procedure: INSERTION OF MESH;  Surgeon: Liz Malady, MD;  Location: [facility] OR;  Service: General;  Laterality: Right;    FAMILY HISTORY Family History  Problem Relation Age of Onset  . Alzheimer's disease Mother   . Tics Mother   . Hypertension Father   . Cancer Father 62    Prostate  . Stroke Paternal Grandmother   . Cancer Paternal Grandfather     Lung  and Prostate   the patient's father is alive at age 79. He lives in a retirement community. The patient's mother died at the age of 25 in 23-Oct-2009; she had significant Alzheimer's disease. The patient is an only child.  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 His wife [name] is a Music therapist in oil. The live on 8 acres and have 2 large horses, a donkey, a Engineer, manufacturing, chickens, etc. The patient's daughter [name] lives in [place] and is currently looking for a job. The patient's  son [name] works for an Economist. The patient has 2 grandchildren, granddaughter age 62 and a grandson aged 8 months. He attends the [employer].  ADVANCED DIRECTIVES: in place  HEALTH MAINTENANCE: History  Substance Use Topics  . Smoking status: Former Smoker -- 0.25 packs/day for 3 years    Quit date: 06/14/1972  . Smokeless tobacco: Not on file  . Alcohol Use: No     Comment: Hx of Drinking wine - Stopped 3 years ago     Allergies  Allergen Reactions  . Cyclobenzaprine Hcl Other (See Comments)    confusion  . Flexeril (Cyclobenzaprine) Anxiety    Current Outpatient Prescriptions  Medication Sig Dispense Refill  . Artificial Saliva (AQUORAL) AERS Use as directed 2 sprays in the mouth or throat 4 (four) times daily as needed.  1 Can  5  .  aspirin EC 81 MG tablet Take 81 mg by mouth daily.      . chlorhexidine (PERIDEX) 0.12 % solution Rinse with 15 mls twice daily for 30 seconds. Use after breakfast and at bedtime. Spit out excess. Do not swallow.  480 mL  2  . dexlansoprazole (DEXILANT) 60 MG capsule Take 60 mg by mouth at bedtime.       . diphenhydrAMINE (BENADRYL) 25 mg capsule Take 25 mg by mouth every 6 (six) hours as needed.      . docusate sodium (COLACE) 100 MG capsule Take 100 mg by mouth 3 (three) times daily.      [name] NASONEX 50 MCG/ACT nasal spray Place 2 sprays into the nose daily as needed.       . pregabalin (LYRICA) 100 MG capsule Take 1 capsule (100 mg total) by mouth 4 (four) times daily.  360 capsule  3  . rosuvastatin (CRESTOR) 40 MG tablet Take 40 mg by mouth at bedtime.       . sodium fluoride (PREVIDENT 5000 PLUS) 1.1 % CREA dental cream Apply thin ribbon of cream to tooth brush. Brush teeth for 2 minutes. Spit out excess-DO NOT swallow. DO NOT rinse afterwards. Repeat nightly.  1 Tube  prn  . Tamsulosin HCl (FLOMAX) 0.4 MG CAPS Take 0.4 mg by mouth at bedtime.       [name] zolpidem (AMBIEN) 10 MG tablet Take 10 mg by mouth at bedtime.         No current facility-administered medications for this visit.    OBJECTIVE: Middle-aged white male looks well Filed Vitals:   10/03/12 0830  BP: 118/78  Pulse: 63  Temp: 97.5 F (36.4 C)  Resp: 20     Body mass index is 28.72 kg/(m^2).    ECOG FS: 0 Filed Weights   10/03/12 0830  Weight: 205 lb 12.8 oz (93.35 kg)   His weight has stabilized over the last 6-8 weeks Sclerae unicteric; chronic mild left ptosis Oropharynx clear, dry; no thrush or ulcers No cervical or supraclavicular adenopathy; Lungs no rales or rhonchi Heart regular rate and rhythm Abd soft, positive bowel sounds, no tenderness to palpation MSK: No focal spinal tendernes; contraction fifth digit right hand  Neuro: Nonfocal except for the right upper extremity limitations, well-oriented; positive affect   LAB RESULTS: Lab Results  Component Value Date   WBC 3.1* 10/03/2012   NEUTROABS 2.0 10/03/2012   HGB 13.7 10/03/2012   HCT 41.5 10/03/2012   MCV 89.3 10/03/2012   PLT 127* 10/03/2012      Chemistry      Component Value Date/Time   NA 142 10/03/2012 0803   NA 137 04/21/2012 1127   K 3.9 10/03/2012 0803   K 4.3 04/21/2012 1127   CL 104 10/03/2012 0803   CL 99 04/21/2012 1127   CO2 30* 10/03/2012 0803   CO2 30 04/21/2012 1127   BUN 8.6 10/03/2012 0803   BUN 17 04/21/2012 1127   CREATININE 0.8 10/03/2012 0803   CREATININE 0.73 04/21/2012 1127      Component Value Date/Time   CALCIUM 9.2 10/03/2012 0803   CALCIUM 9.5 04/21/2012 1127   ALKPHOS 65 10/03/2012 0803   ALKPHOS 62 12/13/2011 0950   AST 13 10/03/2012 0803   AST 18 12/13/2011 0950   ALT 10 10/03/2012 0803   ALT 13 12/13/2011 0950   BILITOT 0.95 10/03/2012 0803   BILITOT 0.4 12/13/2011 0950       STUDIES: No results found. Negative  PET scan December 20   ASSESSMENT: 62 y.o. 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 diagnosed by FNA of a left cervical node March 2013, treated with IMRT completed 11/22/2011 and  concurrent weekly cis-platinum, last dose 11/23/2011  (1) risk factors are smoking history (minimal and remote), significant alcohol use (discontinued 2010), and an equivocal p16 probe  (2) dizziness, possibly due partly to autonomic dysfunction from cis-platinum, resolved  (3) malnutrition: PEG removed October 2013, stopped Boost supplements February 2014; maintaining weight  (4) dry mouth: discontinued pilocarpine and rinses; drinks extra water during meals  (5) at risk of hypothyroidism: TSH normal SEPT 2013  (6) right hand/arm/shoulder pain: 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 He is already scheduled for a CT of the neck and chest in may, and followup with Dr. Basilio Cairo then. If there are any questions regarding those results we will add a PET scan. Otherwise she will see [place] again in 3 months and then again in October, and before the October visit I will obtain a PET scan.  They have a new dog, Mamie, they hope to train as a Soil scientist. Also Diann wishes to change her breast cancer followup from Dr. Kandice Hams at [facility] to here. We will schedule her to see me in the Fall as a new patient.  Colene Mines C    10/03/2012

## 2012-10-12 ENCOUNTER — Encounter: Payer: Self-pay | Admitting: *Deleted

## 2012-10-13 ENCOUNTER — Other Ambulatory Visit: Payer: Self-pay | Admitting: Radiation Oncology

## 2012-10-13 ENCOUNTER — Ambulatory Visit
Admission: RE | Admit: 2012-10-13 | Discharge: 2012-10-13 | Disposition: A | Discharge status: Home / Self Care | Payer: Managed Care, Other (non HMO) | Source: Ambulatory Visit | Attending: Radiation Oncology | Admitting: Radiation Oncology

## 2012-10-13 DIAGNOSIS — C801 Malignant (primary) neoplasm, unspecified: Secondary | ICD-10-CM

## 2012-10-13 DIAGNOSIS — C760 Malignant neoplasm of head, face and neck: Secondary | ICD-10-CM

## 2012-10-13 DIAGNOSIS — C109 Malignant neoplasm of oropharynx, unspecified: Secondary | ICD-10-CM

## 2012-10-13 LAB — BUN AND CREATININE (CC13): Creatinine: 0.8 mg/dL (ref 0.7–1.3)

## 2012-10-13 LAB — TSH: TSH: 2.698 u[IU]/mL (ref 0.350–4.500)

## 2012-10-16 ENCOUNTER — Other Ambulatory Visit: Payer: Self-pay | Admitting: Oncology

## 2012-10-16 ENCOUNTER — Encounter (HOSPITAL_COMMUNITY): Payer: Self-pay

## 2012-10-16 ENCOUNTER — Ambulatory Visit (HOSPITAL_COMMUNITY)
Admission: RE | Admit: 2012-10-16 | Discharge: 2012-10-16 | Disposition: A | Discharge status: Home / Self Care | Payer: Managed Care, Other (non HMO) | Source: Ambulatory Visit | Attending: Radiation Oncology | Admitting: Radiation Oncology

## 2012-10-16 DIAGNOSIS — C770 Secondary and unspecified malignant neoplasm of lymph nodes of head, face and neck: Secondary | ICD-10-CM

## 2012-10-16 DIAGNOSIS — C109 Malignant neoplasm of oropharynx, unspecified: Secondary | ICD-10-CM

## 2012-10-16 DIAGNOSIS — C760 Malignant neoplasm of head, face and neck: Secondary | ICD-10-CM | POA: Insufficient documentation

## 2012-10-16 MED ORDER — IOHEXOL 300 MG/ML  SOLN
100.0000 mL | Freq: Once | INTRAMUSCULAR | Status: AC | PRN
  Administered 2012-10-16: 100 mL via INTRAVENOUS

## 2012-10-18 ENCOUNTER — Encounter: Payer: Self-pay | Admitting: Radiation Oncology

## 2012-10-20 ENCOUNTER — Encounter: Payer: Self-pay | Admitting: Radiation Oncology

## 2012-10-20 ENCOUNTER — Ambulatory Visit
Admission: RE | Admit: 2012-10-20 | Discharge: 2012-10-20 | Disposition: A | Discharge status: Home / Self Care | Payer: Managed Care, Other (non HMO) | Source: Ambulatory Visit | Attending: Radiation Oncology | Admitting: Radiation Oncology

## 2012-10-20 VITALS — BP 117/73 | HR 58 | Temp 97.6°F | Ht 71.0 in | Wt 211.8 lb

## 2012-10-20 DIAGNOSIS — C770 Secondary and unspecified malignant neoplasm of lymph nodes of head, face and neck: Secondary | ICD-10-CM

## 2012-10-20 NOTE — Progress Notes (Signed)
Radiation Oncology         [phone] ________________________________  Name: Steven Robinson MRN: [id number]  Date: 10/20/2012  DOB: May 26, 1951  Follow-Up Visit Note  CC: Buckner Malta, MD  Suzanna Obey, MD  Diagnosis:   [id number] Squamous cell carcinoma, head and neck, unknown primary . Presented with left neck nodes.   Interval Since Last Radiation: Completed 70 Gy/35 fractions on 11-22-11 with concurrent cis-platinum  Narrative:  The patient returns today for routine follow-up. Overall feels great. Exercises, eating healthier diet, cutting back on work for a life balance. Right hand symptoms stable - on Lyrica but off narcotics. Dry mouth manageable, stopped pilocarpine and Aquaoral which didn't help.    Modest voice changes but not frankly hoarse.     Still a bit unsteady on feet and mild cognitive fogginess - but very functional. Underwent bone sequestration last month at   the mandibular left alveolar ridge area number 17 after irritation/exposed bone developed from insertion of a new partial.  Was seen by neurology for mild left ptosis which is felt to be chronic and non pathologic.  ALLERGIES:  is allergic to flexeril.  Meds: Current Outpatient Prescriptions  Medication Sig Dispense Refill  . aspirin EC 81 MG tablet Take 81 mg by mouth daily.      . chlorhexidine (PERIDEX) 0.12 % solution Rinse with 15 mls twice daily for 30 seconds. Use after breakfast and at bedtime. Spit out excess. Do not swallow.  480 mL  2  . dexlansoprazole (DEXILANT) 60 MG capsule Take 60 mg by mouth at bedtime.       . diphenhydrAMINE (BENADRYL) 25 mg capsule Take 25 mg by mouth every 6 (six) hours as needed.      . docusate sodium (COLACE) 100 MG capsule Take 100 mg by mouth 3 (three) times daily.      . rosuvastatin (CRESTOR) 40 MG tablet Take 20 mg by mouth at bedtime.       . sodium fluoride (PREVIDENT 5000 PLUS) 1.1 % CREA dental cream Apply thin ribbon of cream to tooth brush. Brush teeth for  2 minutes. Spit out excess-DO NOT swallow. DO NOT rinse afterwards. Repeat nightly.  1 Tube  prn  . Tamsulosin HCl (FLOMAX) 0.4 MG CAPS Take 0.4 mg by mouth at bedtime.       [name] zolpidem (AMBIEN) 10 MG tablet Take 10 mg by mouth at bedtime.       [name] NASONEX 50 MCG/ACT nasal spray Place 2 sprays into the nose daily as needed.       . pregabalin (LYRICA) 100 MG capsule Take 1 capsule (100 mg total) by mouth 4 (four) times daily.  360 capsule  3   No current facility-administered medications for this encounter.    Physical Findings: The patient is in no acute distress. Patient is alert and oriented.  height is 5\' 11"  (1.803 m) and weight is 211 lb 12.8 oz (96.072 kg). His temperature is 97.6 F (36.4 C). His blood pressure is 117/73 and his pulse is 58. .   General: Alert and oriented, in no acute distress HEENT: Head is normocephalic. Pupils are equally round and reactive to light. Extraocular movements are intact. Mild left ptosis, subtle. Oropharynx is clear and mucous membranes fairly dry. Very tiny punctate clustered area of exposed bone at left posterior alveolar ridge. Neck: Neck is supple, no palpable cervical or supraclavicular lymphadenopathy. No edema. Psychiatric: Judgment and insight are intact. Affect is appropriate.  Lab Findings: Lab Results  Component Value Date   WBC 3.1* 10/03/2012   HGB 13.7 10/03/2012   HCT 41.5 10/03/2012   MCV 89.3 10/03/2012   PLT 127* 10/03/2012    CMP     Component Value Date/Time   NA 142 10/03/2012 0803   NA 137 04/21/2012 1127   K 3.9 10/03/2012 0803   K 4.3 04/21/2012 1127   CL 104 10/03/2012 0803   CL 99 04/21/2012 1127   CO2 30* 10/03/2012 0803   CO2 30 04/21/2012 1127   GLUCOSE 118* 10/03/2012 0803   GLUCOSE 90 04/21/2012 1127   BUN 8.6 10/13/2012 1030   BUN 17 04/21/2012 1127   CREATININE 0.8 10/13/2012 1030   CREATININE 0.73 04/21/2012 1127   CALCIUM 9.2 10/03/2012 0803   CALCIUM 9.5 04/21/2012 1127   PROT 6.5 10/03/2012 0803   PROT 6.8 12/13/2011  0950   ALBUMIN 3.7 10/03/2012 0803   ALBUMIN 3.5 12/13/2011 0950   AST 13 10/03/2012 0803   AST 18 12/13/2011 0950   ALT 10 10/03/2012 0803   ALT 13 12/13/2011 0950   ALKPHOS 65 10/03/2012 0803   ALKPHOS 62 12/13/2011 0950   BILITOT 0.95 10/03/2012 0803   BILITOT 0.4 12/13/2011 0950   GFRNONAA >90 04/21/2012 1127   GFRAA >90 04/21/2012 1127   LABS: TSH normal on 10/13/12   Radiographic Findings: Ct Soft Tissue Neck W Contrast  10/16/2012  *RADIOLOGY REPORT*  Clinical Data: Follow-up head and next to the 5 volts head and neck cancer chemotherapy and radiation therapy with complete.  CT NECK WITH CONTRAST  Technique:  Multidetector CT imaging of the neck was performed with intravenous contrast.  Contrast: OMNIPAQUE IOHEXOL 300 MG/ML  SOLN  Comparison: CT of the neck 04/12/2012.  PET scan 06/12/2012.  Findings: Postradiation changes of the left neck are again noted. The elongated left jugulodigastric lymph node is stable to slightly decreased.  No significant cervical adenopathy is present.  Post radiation mucosal changes are evident within the supraglottic larynx without significant change.  No discrete residual or recurrent mass lesion is evident.  Minimal atherosclerotic changes are evident.  Degenerative changes of the cervical spine are again most pronounced at C5-6, without significant interval change.  Right foraminal stenosis is greatest at C4-5.  Mild edema or atelectasis is noted dependently in the lung apices.  IMPRESSION:  1.  Stable post-treatment changes of the neck. 2.  Left level II lymph node is stable to slightly decreased in size since the prior exam. 3.  No other significant adenopathy. 4.  Moderate spondylosis of the cervical spine is similar to the prior study, most pronounced at C4-5 and C5-6.   Original Report Authenticated By: Marin Roberts, M.D.    Ct Chest W Contrast  10/16/2012  *RADIOLOGY REPORT*  Clinical Data: Head neck cancer  CT CHEST WITH CONTRAST  Technique:   Multidetector CT imaging of the chest was performed following the standard protocol during bolus administration of intravenous contrast.  Contrast: OMNIPAQUE IOHEXOL 300 MG/ML  SOLN  Comparison: PET CT from 06/12/2012.  Chest CT from 06/25/2004.  Findings: No axillary lymphadenopathy.  No mediastinal or hilar lymphadenopathy.  Heart size is normal.  No pericardial or pleural effusion.  Calcified granuloma is identified in the anterior left lower lobe. No other pulmonary parenchymal nodule or mass.  There is no focal airspace consolidation.  Bone windows reveal no worrisome lytic or sclerotic osseous lesions.  IMPRESSION: Stable.  No pulmonary parenchymal nodule or mass.   Original Report Authenticated  By: Kennith Center, M.D.      Impression/Plan:    1) Head and Neck Cancer Status: No evidence of recurrence or progression  2) Nutritional Status: He is eating well. PEG tube has been removed.   3) Risk Factors: The patient has been educated about risk factors including alcohol and tobacco abuse; they understand that avoidance of alcohol and tobacco is important to prevent recurrences as well as other cancers.    4) Swallowing: Good: No active issues   5) Dental: s/p bone sequestion as above. Healing from this. If regresses, knows to contact dentist and or Dr. Kristin Bruins immediately  6) Energy: Excellent. Walking for exercise a couple miles daily. 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   10) Neurologic issues with working diagnosis of radiation myelopathy - satisfactory status, right hand issues main persistent problem but manageable. Off narcotics, on Lyrica which helps.  11) Follow-up in 4 months - no imaging needed at that time. The patient was encouraged to call with any issues or questions before then.  I spent 40 minutes face to face with  the patient and more than 50% of that time was spent in counseling and/or coordination of care. _____________________________________   Lonie Peak, MD

## 2012-11-30 ENCOUNTER — Telehealth: Payer: Self-pay | Admitting: *Deleted

## 2012-11-30 NOTE — Telephone Encounter (Signed)
Steven Robinson called to this RN stating concern due to noted enlarged lymph node on left side of neck yesterday. Per inquiry Steven Robinson states he was in [place] yesterday ( for work ), returned from his usual morning walk and noted enlarged lymph node.  He was able to perform lymph drainage massage with benefits but wanted to know if there was anything else he should do.  Per discussion this RN assured pt per above description probable drainage issue with lymph node. Presently he is unable to palpate any enlargement. He also states he has not been performing recommended self drainage massage as often as he is supposed to.  He has had increased sinus drainage and cough. Throat has been dry and he has " raspy" voice.  Per discussion pt felt reassured related to probable drainage but this RN did request pt to be very diligent over the weekend with massages and increased fluid intake including hot liquids. This RN requested pt to call on Monday with update for other interventions if needed.  Steven Robinson verbalized understanding and appreciation.

## 2012-12-01 NOTE — Telephone Encounter (Signed)
error 

## 2012-12-12 DIAGNOSIS — S82409A Unspecified fracture of shaft of unspecified fibula, initial encounter for closed fracture: Secondary | ICD-10-CM

## 2012-12-12 HISTORY — DX: Unspecified fracture of shaft of unspecified fibula, initial encounter for closed fracture: S82.409A

## 2012-12-21 ENCOUNTER — Emergency Department (HOSPITAL_COMMUNITY)
Admission: EM | Admit: 2012-12-21 | Discharge: 2012-12-22 | Disposition: A | Discharge status: Home / Self Care | Payer: Managed Care, Other (non HMO) | Attending: Emergency Medicine | Admitting: Emergency Medicine

## 2012-12-21 ENCOUNTER — Encounter (HOSPITAL_COMMUNITY): Payer: Self-pay | Admitting: *Deleted

## 2012-12-21 ENCOUNTER — Emergency Department (HOSPITAL_COMMUNITY): Payer: Managed Care, Other (non HMO)

## 2012-12-21 DIAGNOSIS — Z85819 Personal history of malignant neoplasm of unspecified site of lip, oral cavity, and pharynx: Secondary | ICD-10-CM | POA: Insufficient documentation

## 2012-12-21 DIAGNOSIS — E7800 Pure hypercholesterolemia, unspecified: Secondary | ICD-10-CM | POA: Insufficient documentation

## 2012-12-21 DIAGNOSIS — Z79899 Other long term (current) drug therapy: Secondary | ICD-10-CM | POA: Insufficient documentation

## 2012-12-21 DIAGNOSIS — G4733 Obstructive sleep apnea (adult) (pediatric): Secondary | ICD-10-CM | POA: Insufficient documentation

## 2012-12-21 DIAGNOSIS — Z860100 Personal history of colon polyps, unspecified: Secondary | ICD-10-CM | POA: Insufficient documentation

## 2012-12-21 DIAGNOSIS — S91012A Laceration without foreign body, left ankle, initial encounter: Secondary | ICD-10-CM

## 2012-12-21 DIAGNOSIS — S82832A Other fracture of upper and lower end of left fibula, initial encounter for closed fracture: Secondary | ICD-10-CM

## 2012-12-21 DIAGNOSIS — W230XXA Caught, crushed, jammed, or pinched between moving objects, initial encounter: Secondary | ICD-10-CM | POA: Insufficient documentation

## 2012-12-21 DIAGNOSIS — Y929 Unspecified place or not applicable: Secondary | ICD-10-CM | POA: Insufficient documentation

## 2012-12-21 DIAGNOSIS — Z8669 Personal history of other diseases of the nervous system and sense organs: Secondary | ICD-10-CM | POA: Insufficient documentation

## 2012-12-21 DIAGNOSIS — S82899A Other fracture of unspecified lower leg, initial encounter for closed fracture: Secondary | ICD-10-CM | POA: Insufficient documentation

## 2012-12-21 DIAGNOSIS — Z9221 Personal history of antineoplastic chemotherapy: Secondary | ICD-10-CM | POA: Insufficient documentation

## 2012-12-21 DIAGNOSIS — Z8719 Personal history of other diseases of the digestive system: Secondary | ICD-10-CM | POA: Insufficient documentation

## 2012-12-21 DIAGNOSIS — Z87891 Personal history of nicotine dependence: Secondary | ICD-10-CM | POA: Insufficient documentation

## 2012-12-21 DIAGNOSIS — Z8739 Personal history of other diseases of the musculoskeletal system and connective tissue: Secondary | ICD-10-CM | POA: Insufficient documentation

## 2012-12-21 DIAGNOSIS — N400 Enlarged prostate without lower urinary tract symptoms: Secondary | ICD-10-CM | POA: Insufficient documentation

## 2012-12-21 DIAGNOSIS — E785 Hyperlipidemia, unspecified: Secondary | ICD-10-CM | POA: Insufficient documentation

## 2012-12-21 DIAGNOSIS — K219 Gastro-esophageal reflux disease without esophagitis: Secondary | ICD-10-CM | POA: Insufficient documentation

## 2012-12-21 DIAGNOSIS — Z7982 Long term (current) use of aspirin: Secondary | ICD-10-CM | POA: Insufficient documentation

## 2012-12-21 DIAGNOSIS — Z923 Personal history of irradiation: Secondary | ICD-10-CM | POA: Insufficient documentation

## 2012-12-21 DIAGNOSIS — Y9389 Activity, other specified: Secondary | ICD-10-CM | POA: Insufficient documentation

## 2012-12-21 MED ORDER — FENTANYL CITRATE 0.05 MG/ML IJ SOLN
100.0000 ug | Freq: Once | INTRAMUSCULAR | Status: AC
  Administered 2012-12-21: 100 ug via INTRAVENOUS

## 2012-12-21 MED ORDER — TRAMADOL HCL 50 MG PO TABS: 50.0000 mg | ORAL_TABLET | Freq: Four times a day (QID) | ORAL | Status: DC | PRN

## 2012-12-21 MED ORDER — CEFAZOLIN SODIUM 1-5 GM-% IV SOLN
1.0000 g | Freq: Once | INTRAVENOUS | Status: AC
  Administered 2012-12-21: 1 g via INTRAVENOUS
  Filled 2012-12-21: qty 50

## 2012-12-21 MED ORDER — FENTANYL CITRATE 0.05 MG/ML IJ SOLN
INTRAMUSCULAR | Status: AC
  Administered 2012-12-21: 100 ug via INTRAVENOUS
  Filled 2012-12-21: qty 2

## 2012-12-21 MED ORDER — CEPHALEXIN 250 MG PO CAPS: 250.0000 mg | ORAL_CAPSULE | Freq: Four times a day (QID) | ORAL | Status: DC

## 2012-12-21 NOTE — ED Notes (Signed)
Pt to the ED with left leg laceration. Pt got caught between a Therapist, sports and metal post. The lawn mower caused the laceration not the blade. Pt presents with laceration proximal to left ankle, subcutaneous tissue exposed, achillies tendon exposed and intact. Pt can move all toes, cms intact. Respirations equal and unlabored, skin warm and dry, pt is A&Ox4.

## 2012-12-21 NOTE — Consult Note (Signed)
Reason for Consult:  Deep left posterior ankle laceration Referring Physician:   ED MD  Daryle Amis is an 62 y.o. male.  HPI:   62 yo male who was mowing the lawn when he accidentally got his left ankle caught between a fence and the lawnmower sustained a deep laceration to his left posterior ankle.  Was brought to the ED and the wound was cleaned by the ED staff and he was given IV antibiotics.  Ortho is consulted to assess the laceration.  Past Medical History  Diagnosis Date  . Hyperlipoproteinemia   . Obstructive sleep apnea     on C-pap.   [name] GERD (gastroesophageal reflux disease)     chronic  . Hx of colonic polyps   . 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 Vertigo     2/2 intersitial neuritis.   [name] Hearing loss of left ear   . BPH (benign prostatic hyperplasia)   . Hyperlipidemia   . Mucositis 11/01/2011  . S/P radiation therapy 10/04/11-11/22/11    Mucosal Axis/Neck:7000cGy/35Fractions  . Status post chemotherapy     CDDP During with Radiation Therapy  . Gastrostomy tube dependent     removed Oct. 2013  . 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 Neuromuscular disorder      Radiation Myelopathy  . History of EKG     done at PCP office, T. Spears- 2012  . High cholesterol   . Xerostomia   . Pain     right arm and shoulder  . Ptosis of left eyelid   . Neuropathy     right upper extremity  . Right inguinal hernia     Past Surgical History  Procedure Laterality Date  . Fine needle aspiration  08/26/11     LEFT NECK FNA, LYMPH NODE, METASTATIC SQUAMOUS CELL CARCINOMA  . Gastrostomy  09/24/2011    Procedure: GASTROSTOMY;  Surgeon: Liz Malady, MD;  Location: [facility] OR;  Service: General;  Laterality: N/A;  open G-tube placement procedure start @1219   . Rigid esophagoscopy  09/24/2011    Procedure: RIGID ESOPHAGOSCOPY;  Surgeon: Suzanna Obey, MD;  Location: [facility] OR;  Service: ENT;  Laterality: N/A;  Esophagoscopy procedure end @1210   . Hernia repair  1990    Left  .  Diagnostic laparoscopy      g-tube replacement   . Ventral hernia repair  04/24/2012    Procedure: LAPAROSCOPIC VENTRAL HERNIA;  Surgeon: Liz Malady, MD;  Location: [facility] OR;  Service: General;  Laterality: N/A;  laparoscopic repair incisional hernia with mesh  . Inguinal hernia repair  04/24/2012    Procedure: HERNIA REPAIR INGUINAL ADULT;  Surgeon: Liz Malady, MD;  Location: [facility] OR;  Service: General;  Laterality: Right;  . Insertion of mesh  04/24/2012    Procedure: INSERTION OF MESH;  Surgeon: Liz Malady, MD;  Location: [facility] OR;  Service: General;  Laterality: Right;  . Removal bony sequestrum Left 09/29/12    Left mandible / Area #17  . Peg tube removal  03/2012    Family History  Problem Relation Age of Onset  . Alzheimer's disease Mother   . Tics Mother   . Hypertension Father   . Cancer Father 16    Prostate  . Stroke Paternal Grandmother   . Cancer Paternal Grandfather     Lung  and Prostate    Social History:  reports that he quit smoking about 40 years ago. He does not have any smokeless tobacco  history on file. He reports that he does not drink alcohol or use illicit drugs.  Allergies:  Allergies  Allergen Reactions  . Flexeril (Cyclobenzaprine) Anxiety    Confusion    Medications: I have reviewed the patient's current medications.  No results found for this or any previous visit (from the past 48 hour(s)).  Dg Ankle Complete Left  12/21/2012   *RADIOLOGY REPORT*  Clinical Data: Trauma.  Ankle was then between entry and lawn mower with large laceration.  LEFT ANKLE COMPLETE - 3+ VIEW  Comparison: None.  Findings: Soft tissue defect along the posterior medial aspect of the distal left lower leg consistent with laceration.  There is infiltration in the soft tissues consistent with contusion or hematoma.  Soft tissue gas is present consistent with penetrating injury.  Infection is not excluded.  There is a nondisplaced transverse fracture of the mid/distal  shaft of the left fibula. The left ankle appears intact without evidence of displacement or fracture.  No focal bone lesion or bone destruction.  IMPRESSION: Soft tissue injury with laceration, hematoma, and soft tissue gas. Changes consistent with penetrating injury but infection is not excluded.  Transverse nondisplaced fracture of the mid/distal shaft of the left fibula.   Original Report Authenticated By: Burman Nieves, M.D.    ROS Blood pressure 154/85, pulse 65, temperature 98.3 F (36.8 C), temperature source Oral, resp. rate 14, height 6\' 1"  (1.854 m), weight 90.719 kg (200 lb), SpO2 100.00%. Physical Exam  Musculoskeletal:       Left lower leg: He exhibits tenderness, edema and laceration.       Legs: Negative Thompson Test Foot well perfused with good pulses. Some numbness over sural nerve Ankle ROM normal; ligamentously stable  Assessment/Plan: Deep left posterior ankle laceration with exposed achilles tendon; minimal fibula fracture 1)  The wound was irrigated with 3 liters of normal saline by the ED staff.  After good cleaning, loosely re-approximated skin with interrupted 2-0 nylon suture.  Xeroform and well-padded dressing applied 2)  Can WBAT left ankle; will keep on oral antibiotics and put triple antibiotic ointment daily on wound; Follow-up in the office next week.  Steven Robinson Y 12/21/2012, 11:50 PM

## 2012-12-21 NOTE — ED Provider Notes (Signed)
History    CSN: [id number] Arrival date & time 12/21/12  2150  First MD Initiated Contact with Patient 12/21/12 2157     Chief Complaint  Patient presents with  . Extremity Laceration   (Consider location/radiation/quality/duration/timing/severity/associated sxs/prior Treatment) HPI Pt presents with L ankle injury after sustaining crush injury while on riding lawn mower. Pt states pinned ankle between lower portion of mower and fence post. +laceration to posterior L ankle. No numbness. Td within last 5 years.  Past Medical History  Diagnosis Date  . Hyperlipoproteinemia   . Obstructive sleep apnea     on C-pap.   [name] GERD (gastroesophageal reflux disease)     chronic  . Hx of colonic polyps   . 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 Vertigo     2/2 intersitial neuritis.   [name] Hearing loss of left ear   . BPH (benign prostatic hyperplasia)   . Hyperlipidemia   . Mucositis 11/01/2011  . S/P radiation therapy 10/04/11-11/22/11    Mucosal Axis/Neck:7000cGy/35Fractions  . Status post chemotherapy     CDDP During with Radiation Therapy  . Gastrostomy tube dependent     removed Oct. 2013  . 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 Neuromuscular disorder      Radiation Myelopathy  . History of EKG     done at PCP office, T. Spears- 2012  . High cholesterol   . Xerostomia   . Pain     right arm and shoulder  . Ptosis of left eyelid   . Neuropathy     right upper extremity  . Right inguinal hernia    Past Surgical History  Procedure Laterality Date  . Fine needle aspiration  08/26/11     LEFT NECK FNA, LYMPH NODE, METASTATIC SQUAMOUS CELL CARCINOMA  . Gastrostomy  09/24/2011    Procedure: GASTROSTOMY;  Surgeon: Liz Malady, MD;  Location: [facility] OR;  Service: General;  Laterality: N/A;  open G-tube placement procedure start @1219   . Rigid esophagoscopy  09/24/2011    Procedure: RIGID ESOPHAGOSCOPY;  Surgeon: Suzanna Obey, MD;  Location: [facility] OR;  Service: ENT;  Laterality: N/A;   Esophagoscopy procedure end @1210   . Hernia repair  1990    Left  . Diagnostic laparoscopy      g-tube replacement   . Ventral hernia repair  04/24/2012    Procedure: LAPAROSCOPIC VENTRAL HERNIA;  Surgeon: Liz Malady, MD;  Location: [facility] OR;  Service: General;  Laterality: N/A;  laparoscopic repair incisional hernia with mesh  . Inguinal hernia repair  04/24/2012    Procedure: HERNIA REPAIR INGUINAL ADULT;  Surgeon: Liz Malady, MD;  Location: [facility] OR;  Service: General;  Laterality: Right;  . Insertion of mesh  04/24/2012    Procedure: INSERTION OF MESH;  Surgeon: Liz Malady, MD;  Location: [facility] OR;  Service: General;  Laterality: Right;  . Removal bony sequestrum Left 09/29/12    Left mandible / Area #17  . Peg tube removal  03/2012   Family History  Problem Relation Age of Onset  . Alzheimer's disease Mother   . Tics Mother   . Hypertension Father   . Cancer Father 63    Prostate  . Stroke Paternal Grandmother   . Cancer Paternal Grandfather     Lung  and Prostate   History  Substance Use Topics  . Smoking status: Former Smoker -- 0.25 packs/day for 3 years    Quit date: 06/14/1972  . Smokeless  tobacco: Not on file  . Alcohol Use: No     Comment: Hx of Drinking wine - Stopped 3 years ago    Review of Systems  HENT: Negative for neck pain.   Gastrointestinal: Negative for nausea, vomiting and abdominal pain.  Skin: Positive for wound.  Neurological: Negative for dizziness, weakness, numbness and headaches.  All other systems reviewed and are negative.    Allergies  Flexeril  Home Medications   Current Outpatient Rx  Name  Route  Sig  Dispense  Refill  . aspirin EC 81 MG tablet   Oral   Take 81 mg by mouth daily.         . chlorhexidine (PERIDEX) 0.12 % solution      Rinse with 15 mls twice daily for 30 seconds. Use after breakfast and at bedtime. Spit out excess. Do not swallow.   480 mL   2   . dexlansoprazole (DEXILANT) 60 MG capsule    Oral   Take 60 mg by mouth at bedtime.          . diphenhydrAMINE (BENADRYL) 25 mg capsule   Oral   Take 25 mg by mouth every 6 (six) hours as needed.         . docusate sodium (COLACE) 100 MG capsule   Oral   Take 100 mg by mouth 3 (three) times daily.         [name] NASONEX 50 MCG/ACT nasal spray   Nasal   Place 2 sprays into the nose daily as needed.          . pregabalin (LYRICA) 100 MG capsule   Oral   Take 1 capsule (100 mg total) by mouth 4 (four) times daily.   360 capsule   3   . rosuvastatin (CRESTOR) 40 MG tablet   Oral   Take 20 mg by mouth at bedtime.          . sodium fluoride (PREVIDENT 5000 PLUS) 1.1 % CREA dental cream      Apply thin ribbon of cream to tooth brush. Brush teeth for 2 minutes. Spit out excess-DO NOT swallow. DO NOT rinse afterwards. Repeat nightly.   1 Tube   prn   . Tamsulosin HCl (FLOMAX) 0.4 MG CAPS   Oral   Take 0.4 mg by mouth at bedtime.          [name] zolpidem (AMBIEN) 10 MG tablet   Oral   Take 10 mg by mouth at bedtime.          . cephALEXin (KEFLEX) 250 MG capsule   Oral   Take 1 capsule (250 mg total) by mouth 4 (four) times daily.   28 capsule   0   . traMADol (ULTRAM) 50 MG tablet   Oral   Take 1 tablet (50 mg total) by mouth every 6 (six) hours as needed for pain.   15 tablet   0    BP 131/92  Pulse 64  Temp(Src) 98.3 F (36.8 C) (Oral)  Resp 14  Ht 6\' 1"  (1.854 m)  Wt 200 lb (90.719 kg)  BMI 26.39 kg/m2  SpO2 100% Physical Exam  Nursing note and vitals reviewed. Constitutional: He is oriented to person, place, and time. He appears well-developed and well-nourished. No distress.  HENT:  Head: Normocephalic and atraumatic.  Mouth/Throat: Oropharynx is clear and moist.  Eyes: EOM are normal. Pupils are equal, round, and reactive to light.  Neck: Normal range of motion. Neck supple.  Cardiovascular: Normal rate and regular rhythm.   Pulmonary/Chest: Effort normal and breath sounds normal. No  respiratory distress. He has no wheezes. He has no rales.  Abdominal: Soft. Bowel sounds are normal.  Musculoskeletal: Normal range of motion. He exhibits no edema and no tenderness.  FROM of active motion of L ankle, 2+ DP  Neurological: He is alert and oriented to person, place, and time.  5/5 motor in all ext, sensation intact  Skin: Skin is warm and dry. No rash noted. No erythema.  8-9 cm laceration to posterior L ankle. +exposed achilles tendon. Macerated tissue. Achilles appears to be intact. No active bleeding. No definite gross contamination.   Psychiatric: He has a normal mood and affect. His behavior is normal.    ED Course  Procedures (including critical care time) Labs Reviewed - No data to display Dg Ankle Complete Left  12/21/2012   *RADIOLOGY REPORT*  Clinical Data: Trauma.  Ankle was then between entry and lawn mower with large laceration.  LEFT ANKLE COMPLETE - 3+ VIEW  Comparison: None.  Findings: Soft tissue defect along the posterior medial aspect of the distal left lower leg consistent with laceration.  There is infiltration in the soft tissues consistent with contusion or hematoma.  Soft tissue gas is present consistent with penetrating injury.  Infection is not excluded.  There is a nondisplaced transverse fracture of the mid/distal shaft of the left fibula. The left ankle appears intact without evidence of displacement or fracture.  No focal bone lesion or bone destruction.  IMPRESSION: Soft tissue injury with laceration, hematoma, and soft tissue gas. Changes consistent with penetrating injury but infection is not excluded.  Transverse nondisplaced fracture of the mid/distal shaft of the left fibula.   Original Report Authenticated By: Burman Nieves, M.D.   1. Fracture of fibula, distal, left, closed   2. Laceration of ankle, left, initial encounter     MDM  Discussed with Dr Magnus Ivan who will see in ED and close wound.   Loren Racer, MD 12/23/12 [phone]

## 2012-12-21 NOTE — ED Notes (Signed)
Family at bedside. 

## 2012-12-21 NOTE — ED Notes (Signed)
MD at bedside. 

## 2013-01-08 ENCOUNTER — Encounter: Payer: Self-pay | Admitting: Physician Assistant

## 2013-01-08 ENCOUNTER — Other Ambulatory Visit (HOSPITAL_BASED_OUTPATIENT_CLINIC_OR_DEPARTMENT_OTHER): Payer: Managed Care, Other (non HMO) | Admitting: Lab

## 2013-01-08 ENCOUNTER — Ambulatory Visit (HOSPITAL_BASED_OUTPATIENT_CLINIC_OR_DEPARTMENT_OTHER): Payer: Managed Care, Other (non HMO) | Admitting: Physician Assistant

## 2013-01-08 ENCOUNTER — Telehealth: Payer: Self-pay | Admitting: *Deleted

## 2013-01-08 VITALS — BP 120/87 | HR 69 | Temp 97.7°F | Resp 20 | Ht 73.0 in | Wt 211.4 lb

## 2013-01-08 DIAGNOSIS — C760 Malignant neoplasm of head, face and neck: Secondary | ICD-10-CM

## 2013-01-08 DIAGNOSIS — G589 Mononeuropathy, unspecified: Secondary | ICD-10-CM

## 2013-01-08 DIAGNOSIS — G629 Polyneuropathy, unspecified: Secondary | ICD-10-CM

## 2013-01-08 DIAGNOSIS — C770 Secondary and unspecified malignant neoplasm of lymph nodes of head, face and neck: Secondary | ICD-10-CM

## 2013-01-08 DIAGNOSIS — D649 Anemia, unspecified: Secondary | ICD-10-CM

## 2013-01-08 DIAGNOSIS — C109 Malignant neoplasm of oropharynx, unspecified: Secondary | ICD-10-CM

## 2013-01-08 LAB — CBC WITH DIFFERENTIAL/PLATELET
BASO%: 0.5 % (ref 0.0–2.0)
Basophils Absolute: 0 10*3/uL (ref 0.0–0.1)
HCT: 38.2 % — ABNORMAL LOW (ref 38.4–49.9)
HGB: 12.7 g/dL — ABNORMAL LOW (ref 13.0–17.1)
MCHC: 33.2 g/dL (ref 32.0–36.0)
MONO#: 0.5 10*3/uL (ref 0.1–0.9)
NEUT#: 3.2 10*3/uL (ref 1.5–6.5)
NEUT%: 73.2 % (ref 39.0–75.0)
WBC: 4.4 10*3/uL (ref 4.0–10.3)
lymph#: 0.6 10*3/uL — ABNORMAL LOW (ref 0.9–3.3)

## 2013-01-08 LAB — COMPREHENSIVE METABOLIC PANEL (CC13)
AST: 18 U/L (ref 5–34)
Albumin: 3.7 g/dL (ref 3.5–5.0)
BUN: 8.8 mg/dL (ref 7.0–26.0)
Calcium: 8.9 mg/dL (ref 8.4–10.4)
Chloride: 106 mEq/L (ref 98–109)
Potassium: 4.2 mEq/L (ref 3.5–5.1)

## 2013-01-08 NOTE — Telephone Encounter (Signed)
sw pt made him aware that i moved his labs for 04/02/13 @8am  to 9am. Pt is aware...td

## 2013-01-08 NOTE — Progress Notes (Signed)
ID: Steven Robinson   DOB: December 02, 1950  MR#: [id number]  WUJ#:[id number]  PCP:  SU: Violeta Gelinas MD; Suzanna Obey MD OTHER MD: Lonie Peak, Levert Feinstein, Shirlean Kelly, Tamera Punt   HISTORY OF PRESENT ILLNESS: HPI: The patient noted an enlarging left neck mass early March 2013, and brought this to the attention of his PCP, Dr. Dewain Penning. She obtained a neck CT at [facility] 08/20/2011, which showed multiple enlarged nodes in the left mid-neck along the jugular v., the largest 2.0 cm. She referred the patient to Suzanna Obey in ENT and FNA of one of the enlarged nodes was positive for squamous cell carcinoma ([id number]). A p16 stain was read as equivocal. PET scan 09/03/2011 showed SUVs >6 in the left oropharynx and left jugular chain nodes; SUV of 5.49 was noted in the larynx. There was no evidence of contralateral or disseminated disease.  This was consistent with a clinical T1 N2,stage IVA left oropharyngeal SCCA. However multiple biopsies under Dr. Jearld Fenton 09/09/2011 [[id number]] were all negative. The pathologic staging therefore is TX N2, which impacts radiation planning.  The patient met with Drs. Cruzita Lederer, and IMRT with concurrent chemosensitization was planned. The patient had dental evaluation under Dr. Kristin Bruins, several extractions under Dr. Chales Salmon, had audiometry (showing mild high-frequency hearing loss) and met with nutrition. A PEG tube placed by IR unfortunately became dislodged and infected, and had to be replaced through an open procedure under Violeta Gelinas. This delayed the start of chemotherapy and caused a change in plan from the original CDDP at 100 mg/m2 Q3w to 40 mg/M2 weekly. Radiation was started 10/04/2011 and chemotherapy 10/25/2011. The patient's subsequent history is as detailed below.   INTERVAL HISTORY: Steven Robinson returns today with his wife Steven Robinson for followup of his squamous cell carcinoma of the oropharynx. Interval history is remarkable for an accident involving his lawnmower  requiring a visit to the emergency room earlier this month. His left leg became caught between the lawnmower and a fence, causing a fracture of the left fibula, in addition to a large laceration. Teeth just saw the orthopedic surgeon this morning, and was told that the area appears to be healing well.    In addition, Valeria has also had CTs of the neck and chest since his last visit here in April. Both of these were very positive, with no evidence of disease recurrence or progression.   REVIEW OF SYSTEMS: Steven Robinson is feeling extremely well. His energy level has almost returned to normal. He is still working, a normal schedule for the most part. He's had no recent illnesses and denies fevers or chills. He has had some sinus congestion and postnasal drip, and notices he is clearing his throat abnormal frequently. He denies, however, any sore throat, and has had no problems swallowing at all. His appetite is great. He's had no problems with nausea and denies any change in bowel or bladder habits.   Steven Robinson denies any cough, phlegm production, or increased shortness of breath, or chest pain. She's had no abnormal headaches, dizziness, or change in vision. 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 Lyrica occasionally makes them feel a little lightheaded, but overall he is tolerating it well. He denies any additional pain currently, no unusual myalgias, arthralgias, or bony pain.  A detailed review of systems is otherwise stable and noncontributory.  PAST MEDICAL HISTORY: Past Medical History  Diagnosis Date  . Hyperlipoproteinemia   . Obstructive sleep  apnea     on C-pap.   [name] GERD (gastroesophageal reflux disease)     chronic  . Hx of colonic polyps   . 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 Vertigo     2/2 intersitial neuritis.   [name] Hearing loss of left ear   . BPH (benign prostatic hyperplasia)   . Hyperlipidemia   .  Mucositis 11/01/2011  . S/P radiation therapy 10/04/11-11/22/11    Mucosal Axis/Neck:7000cGy/35Fractions  . Status post chemotherapy     CDDP During with Radiation Therapy  . Gastrostomy tube dependent     removed Oct. 2013  . 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 Neuromuscular disorder      Radiation Myelopathy  . History of EKG     done at PCP office, T. SpearsMay 13, 2012  . High cholesterol   . Xerostomia   . Pain     right arm and shoulder  . Ptosis of left eyelid   . Neuropathy     right upper extremity  . Right inguinal hernia     PAST SURGICAL HISTORY: Past Surgical History  Procedure Laterality Date  . Fine needle aspiration  08/26/11     LEFT NECK FNA, LYMPH NODE, METASTATIC SQUAMOUS CELL CARCINOMA  . Gastrostomy  09/24/2011    Procedure: GASTROSTOMY;  Surgeon: Liz Malady, MD;  Location: [facility] OR;  Service: General;  Laterality: N/A;  open G-tube placement procedure start @1219   . Rigid esophagoscopy  09/24/2011    Procedure: RIGID ESOPHAGOSCOPY;  Surgeon: Suzanna Obey, MD;  Location: [facility] OR;  Service: ENT;  Laterality: N/A;  Esophagoscopy procedure end @1210   . Hernia repair  1990    Left  . Diagnostic laparoscopy      g-tube replacement   . Ventral hernia repair  04/24/2012    Procedure: LAPAROSCOPIC VENTRAL HERNIA;  Surgeon: Liz Malady, MD;  Location: [facility] OR;  Service: General;  Laterality: N/A;  laparoscopic repair incisional hernia with mesh  . Inguinal hernia repair  04/24/2012    Procedure: HERNIA REPAIR INGUINAL ADULT;  Surgeon: Liz Malady, MD;  Location: [facility] OR;  Service: General;  Laterality: Right;  . Insertion of mesh  04/24/2012    Procedure: INSERTION OF MESH;  Surgeon: Liz Malady, MD;  Location: [facility] OR;  Service: General;  Laterality: Right;  . Removal bony sequestrum Left 09/29/12    Left mandible / Area #17  . Peg tube removal  03/2012    FAMILY HISTORY Family History  Problem Relation Age of Onset  . Alzheimer's disease Mother   . Tics Mother    . Hypertension Father   . Cancer Father 74    Prostate  . Stroke Paternal Grandmother   . Cancer Paternal Grandfather     Lung  and Prostate   the patient's father is alive at age 74. He lives in a retirement community. The patient's mother died at the age of 56 in 2009-10-24; she had significant Alzheimer's disease. The patient is an only child.  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 His wife Steven Robinson is a Music therapist in oil. The live on 8 acres and have 2 large horses, a donkey, a Engineer, manufacturing, chickens, etc. The patient's daughter Steven Robinson lives in [place] and is currently looking for a job. The patient's son Steven Robinson works for an Economist. The patient has 2 grandchildren, granddaughter age 1 and a  grandson aged 8 months. He attends the [employer].  ADVANCED DIRECTIVES: in place  HEALTH MAINTENANCE: History  Substance Use Topics  . Smoking status: Former Smoker -- 0.25 packs/day for 3 years    Quit date: 06/14/1972  . Smokeless tobacco: Not on file  . Alcohol Use: No     Comment: Hx of Drinking wine - Stopped 3 years ago     Allergies  Allergen Reactions  . Flexeril (Cyclobenzaprine) Anxiety    Confusion    Current Outpatient Prescriptions  Medication Sig Dispense Refill  . aspirin EC 81 MG tablet Take 81 mg by mouth daily.      [name] dexlansoprazole (DEXILANT) 60 MG capsule Take 60 mg by mouth at bedtime.       . diphenhydrAMINE (BENADRYL) 25 mg capsule Take 25 mg by mouth every 6 (six) hours as needed.      . docusate sodium (COLACE) 100 MG capsule Take 100 mg by mouth 3 (three) times daily.      . pregabalin (LYRICA) 100 MG capsule Take 1 capsule (100 mg total) by mouth 4 (four) times daily.  360 capsule  3  . rosuvastatin (CRESTOR) 40 MG tablet Take 20 mg by mouth at bedtime.       . sodium fluoride (PREVIDENT 5000 PLUS) 1.1 % CREA dental cream Apply  thin ribbon of cream to tooth brush. Brush teeth for 2 minutes. Spit out excess-DO NOT swallow. DO NOT rinse afterwards. Repeat nightly.  1 Tube  prn  . Tamsulosin HCl (FLOMAX) 0.4 MG CAPS Take 0.4 mg by mouth at bedtime.       [name] zolpidem (AMBIEN) 10 MG tablet Take 10 mg by mouth at bedtime.       . cephALEXin (KEFLEX) 250 MG capsule Take 1 capsule (250 mg total) by mouth 4 (four) times daily.  28 capsule  0  . chlorhexidine (PERIDEX) 0.12 % solution Rinse with 15 mls twice daily for 30 seconds. Use after breakfast and at bedtime. Spit out excess. Do not swallow.  480 mL  2  . NASONEX 50 MCG/ACT nasal spray Place 2 sprays into the nose daily as needed.        No current facility-administered medications for this visit.    OBJECTIVE: Middle-aged white male looks well Filed Vitals:   01/08/13 1310  BP: 120/87  Pulse: 69  Temp: 97.7 F (36.5 C)  Resp: 20     Body mass index is 27.9 kg/(m^2).    ECOG FS: 0 Filed Weights   01/08/13 1310  Weight: 211 lb 6.4 oz (95.89 kg)   Sclerae unicteric; chronic mild left ptosis Oropharynx clear, dry; no thrush or ulcers No cervical or supraclavicular adenopathy; Lungs clear to auscultation bilaterally, no wheezes, no rales or rhonchi Heart regular rate and rhythm, no murmur appreciated Abdomen  soft, positive bowel sounds, no tenderness to palpation MSK: No focal spinal tendernes; contraction fifth digit right hand, stable  No peripheral swelling.  Clean, dry ACE bandage wrapped around the lower portion of the left leg. Neuro: Nonfocal except for stable neuropathy the right upper extremity limitations, well-oriented; positive affect   LAB RESULTS: Lab Results  Component Value Date   WBC 4.4 01/08/2013   NEUTROABS 3.2 01/08/2013   HGB 12.7* 01/08/2013   HCT 38.2* 01/08/2013   MCV 91.0 01/08/2013   PLT 156 01/08/2013      Chemistry      Component Value Date/Time   NA 142 01/08/2013 1250   NA  137 04/21/2012 1127   K 4.2 01/08/2013 1250   K 4.3  04/21/2012 1127   CL 104 10/03/2012 0803   CL 99 04/21/2012 1127   CO2 27 01/08/2013 1250   CO2 30 04/21/2012 1127   BUN 8.8 01/08/2013 1250   BUN 17 04/21/2012 1127   CREATININE 0.8 01/08/2013 1250   CREATININE 0.73 04/21/2012 1127      Component Value Date/Time   CALCIUM 8.9 01/08/2013 1250   CALCIUM 9.5 04/21/2012 1127   ALKPHOS 73 01/08/2013 1250   ALKPHOS 62 12/13/2011 0950   AST 18 01/08/2013 1250   AST 18 12/13/2011 0950   ALT 11 01/08/2013 1250   ALT 13 12/13/2011 0950   BILITOT 0.56 01/08/2013 1250   BILITOT 0.4 12/13/2011 0950       STUDIES: Dg Ankle Complete Left  2012-12-28   *RADIOLOGY REPORT*  Clinical Data: Trauma.  Ankle was then between entry and lawn mower with large laceration.  LEFT ANKLE COMPLETE - 3+ VIEW  Comparison: None.  Findings: Soft tissue defect along the posterior medial aspect of the distal left lower leg consistent with laceration.  There is infiltration in the soft tissues consistent with contusion or hematoma.  Soft tissue gas is present consistent with penetrating injury.  Infection is not excluded.  There is a nondisplaced transverse fracture of the mid/distal shaft of the left fibula. The left ankle appears intact without evidence of displacement or fracture.  No focal bone lesion or bone destruction.  IMPRESSION: Soft tissue injury with laceration, hematoma, and soft tissue gas. Changes consistent with penetrating injury but infection is not excluded.  Transverse nondisplaced fracture of the mid/distal shaft of the left fibula.   Original Report Authenticated By: Burman Nieves, M.D.    CT Soft Tissue Neck with Contrast  10/16/2012  *RADIOLOGY REPORT*  Clinical Data: Follow-up head and next to the 5 volts head and neck  cancer chemotherapy and radiation therapy with complete.  CT NECK WITH CONTRAST  Technique: Multidetector CT imaging of the neck was performed with  intravenous contrast.  Contrast: OMNIPAQUE IOHEXOL 300 MG/ML SOLN  Comparison: CT of the  neck 04/12/2012. PET scan 06/12/2012.  Findings: Postradiation changes of the left neck are again noted.  The elongated left jugulodigastric lymph node is stable to slightly  decreased. No significant cervical adenopathy is present.  Post radiation mucosal changes are evident within the supraglottic  larynx without significant change. No discrete residual or  recurrent mass lesion is evident.  Minimal atherosclerotic changes are evident.  Degenerative changes of the cervical spine are again most  pronounced at C5-6, without significant interval change. Right  foraminal stenosis is greatest at C4-5.  Mild edema or atelectasis is noted dependently in the lung apices.  IMPRESSION:  1. Stable post-treatment changes of the neck.  2. Left level II lymph node is stable to slightly decreased in  size since the prior exam.  3. No other significant adenopathy.  4. Moderate spondylosis of the cervical spine is similar to the  prior study, most pronounced at C4-5 and C5-6.  Original Report Authenticated By: Marin Roberts, M.D.    CT Chest with Contrast 10/16/2012  *RADIOLOGY REPORT*  Clinical Data: Head neck cancer  CT CHEST WITH CONTRAST  Technique: Multidetector CT imaging of the chest was performed  following the standard protocol during bolus administration of  intravenous contrast.  Contrast: OMNIPAQUE IOHEXOL 300 MG/ML SOLN  Comparison: PET CT from 06/12/2012. Chest CT from 06/25/2004.  Findings:  No axillary lymphadenopathy. No mediastinal or hilar  lymphadenopathy. Heart size is normal. No pericardial or pleural  effusion.  Calcified granuloma is identified in the anterior left lower lobe.  No other pulmonary parenchymal nodule or mass. There is no focal  airspace consolidation.  Bone windows reveal no worrisome lytic or sclerotic osseous  lesions.  IMPRESSION:  Stable. No pulmonary parenchymal nodule or mass.  Original Report Authenticated By: Kennith Center,  M.D.     ASSESSMENT: 62 y.o. 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 diagnosed by FNA of a left cervical node March 2013, treated with IMRT completed 11/22/2011 and concurrent weekly cis-platinum, last dose 11/23/2011  (1) risk factors are smoking history (minimal and remote), significant alcohol use (discontinued 2010), and an equivocal p16 probe  (2) dizziness, possibly due partly to autonomic dysfunction from cis-platinum, resolved  (3) malnutrition: PEG removed October 2013, stopped Boost supplements February 2014; maintaining weight  (4) dry mouth: discontinued pilocarpine and rinses; drinks extra water during meals  (5) at risk of hypothyroidism: TSH normal SEPT 2013  (6) right hand/arm/shoulder pain: 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 We will continue to follow him closely. He is scheduled to see Dr. Basilio Cairo in September, and will return for labs and physical exam with Dr. Darnelle Catalan in October. Per Dr. Darnelle Catalan previous note, we will obtain a PET scan prior to seeing him in October.  All this was reviewed in detail with Steven Robinson and Steven Robinson today. They will call any changes or problems prior to the next appointment.    Keilin Gamboa    01/08/2013

## 2013-01-08 NOTE — Telephone Encounter (Signed)
appts made and printed. Pt is aware that cs will call with a ppt d/t for his PET scans...td

## 2013-01-25 ENCOUNTER — Encounter (HOSPITAL_BASED_OUTPATIENT_CLINIC_OR_DEPARTMENT_OTHER): Payer: Managed Care, Other (non HMO) | Attending: Internal Medicine

## 2013-01-25 DIAGNOSIS — S81009A Unspecified open wound, unspecified knee, initial encounter: Secondary | ICD-10-CM | POA: Insufficient documentation

## 2013-01-25 DIAGNOSIS — Z85819 Personal history of malignant neoplasm of unspecified site of lip, oral cavity, and pharynx: Secondary | ICD-10-CM | POA: Insufficient documentation

## 2013-01-25 DIAGNOSIS — Z923 Personal history of irradiation: Secondary | ICD-10-CM | POA: Insufficient documentation

## 2013-01-25 DIAGNOSIS — S81809A Unspecified open wound, unspecified lower leg, initial encounter: Secondary | ICD-10-CM | POA: Insufficient documentation

## 2013-01-25 DIAGNOSIS — Z9221 Personal history of antineoplastic chemotherapy: Secondary | ICD-10-CM | POA: Insufficient documentation

## 2013-01-26 NOTE — Progress Notes (Signed)
[facility]  NAME:  Steven Robinson, Steven Robinson NO.:  [id number]  MEDICAL RECORD NO.:  [id number]      DATE OF BIRTH:  1951/05/01  PHYSICIAN:  Ardath Sax, M.D.           VISIT DATE:                                  OFFICE VISIT   This is a 62 year old gentleman who comes to [place] after falling off of a tractor and receiving a significant wound on the medial anterior aspect of his left leg.  He has a wound there about 4-5 cm long and 2 cm wide and it goes a good 2 cm down to the muscular tissue in the area close to the Achilles tendon.  I can easily see the proximal part of the medial Achilles tendon.  I debrided a lot of necrotic skin and fat from this wound and scrubbed it out very well, and we are going to treat it with Santyl and Silvercel and wrap him in Kerlix and Coban.  This man has very involved medical history.  Most importantly he has had squamous cell carcinoma of his oropharynx and has undergone multiple surgical procedures and also had radiation and chemotherapy.  He has also had hernia repair, and he has also had gastrostomy and a feeding tube.  He has a history of a herniated lumbar disk, and he has also got prostatic hypertrophy.  He told me that he has some sort of neuropathy of his legs.  This man is on many medicines including Lyrica, Flomax, Ambien,, Nasonex, Dexilant, and Crestor.  His vital signs were his blood pressure 122/79, respirations 17, pulse 65, temperature 97.5.  He weighs 205 pounds.  He is 6 feet 1 inch.  I am going to see him next week.  He will need multiple debridements and Santyl and silver alginate treatments and eventually perhaps an Apligraf.  So, his diagnosis is traumatic wound of the left leg with exposed tendon and muscle and a lot of necrosis.  Other diagnoses, history of oropharyngeal squamous cell carcinoma, and history of lumbar disk, past history of he had a G-tube put in place when he had all of his  oropharyngeal cancer surgery and neuropathy.     Ardath Sax, M.D.     PP/MEDQ  D:  01/25/2013  T:  01/26/2013  Job:  [id number]

## 2013-02-15 ENCOUNTER — Encounter (HOSPITAL_BASED_OUTPATIENT_CLINIC_OR_DEPARTMENT_OTHER): Payer: Managed Care, Other (non HMO) | Attending: Internal Medicine

## 2013-02-15 DIAGNOSIS — L97809 Non-pressure chronic ulcer of other part of unspecified lower leg with unspecified severity: Secondary | ICD-10-CM | POA: Insufficient documentation

## 2013-02-20 ENCOUNTER — Encounter: Payer: Self-pay | Admitting: Radiation Oncology

## 2013-02-23 ENCOUNTER — Encounter: Payer: Self-pay | Admitting: Radiation Oncology

## 2013-02-23 ENCOUNTER — Ambulatory Visit
Admission: RE | Admit: 2013-02-23 | Discharge: 2013-02-23 | Disposition: A | Discharge status: Home / Self Care | Payer: Managed Care, Other (non HMO) | Source: Ambulatory Visit | Attending: Radiation Oncology | Admitting: Radiation Oncology

## 2013-02-23 VITALS — BP 131/83 | HR 74 | Temp 97.8°F | Ht 73.0 in | Wt 210.6 lb

## 2013-02-23 DIAGNOSIS — C770 Secondary and unspecified malignant neoplasm of lymph nodes of head, face and neck: Secondary | ICD-10-CM

## 2013-02-23 HISTORY — DX: Unspecified fracture of shaft of unspecified fibula, initial encounter for closed fracture: S82.409A

## 2013-02-23 HISTORY — DX: Unspecified ptosis of left eyelid: H02.402

## 2013-02-23 NOTE — Progress Notes (Signed)
Mr. Bero here for fu appointment following radiation therapy for cancer of the Oropharynx.  He reports that he is eating without any pain or difficulty swallowing, but he does not like the taste of most meats and primarily eats fish and vegetables.   He has to drink frequently when eating due to xerostomia.  He also reports hypotension with sensation of feeling off balance, especially upon awakening in the am.  He denies any vision changes, headaches, or nausea/vomiting,but he aslo reports a sensation of "stopped up ears" for which he has not sought any medical attention.    He had a accident while using his tractor, which resulted in a left lower leg fracture and a large wound necessitating the need for weekly assessment/dressing changes at the wound care center on the [employer].

## 2013-02-26 ENCOUNTER — Encounter: Payer: Self-pay | Admitting: Radiation Oncology

## 2013-02-26 NOTE — Progress Notes (Signed)
Radiation Oncology         [phone] ________________________________  Name: Steven Robinson MRN: [id number]  Date: 02/23/2013  DOB: 1951-03-21  Follow-Up Visit Note  CC: Herb Grays, MD  Suzanna Obey, MD  Diagnosis and Prior Radiotherapy:  [id number] Squamous cell carcinoma, head and neck, unknown primary . Presented with left neck nodes.  Interval Since Last Radiation: Completed 70 Gy/35 fractions on 11-22-11 with concurrent cis-platinum  Narrative:  The patient returns today for routine follow-up. He reports that he is eating without any pain or difficulty swallowing, but he does not like the taste of most meats and primarily eats fish and vegetables. He has to drink frequently when eating due to xerostomia. He also reports hypotension with sensation of feeling off balance, especially upon awakening in the am - it resolves later in the morning. He denies any vision changes, headaches, or nausea/vomiting, but he aslo reports a sensation of "stopped up ears" for which he has not sought any medical attention.  He had a accident while using his tractor, which resulted in a left lower leg fracture and a large wound necessitating the need for weekly assessment/dressing changes at the wound care center. He has healed from bone sequestration in his tooth root - no more bony fragments.                               ALLERGIES:  is allergic to flexeril.  Meds: Current Outpatient Prescriptions  Medication Sig Dispense Refill  . aspirin EC 81 MG tablet Take 81 mg by mouth daily.      [name] dexlansoprazole (DEXILANT) 60 MG capsule Take 60 mg by mouth at bedtime.       . docusate sodium (COLACE) 100 MG capsule Take 100 mg by mouth 3 (three) times daily.      . pregabalin (LYRICA) 100 MG capsule Take 1 capsule (100 mg total) by mouth 4 (four) times daily.  360 capsule  3  . rosuvastatin (CRESTOR) 40 MG tablet Take 20 mg by mouth at bedtime.       . sodium fluoride (PREVIDENT 5000 PLUS) 1.1 % CREA dental cream  Apply thin ribbon of cream to tooth brush. Brush teeth for 2 minutes. Spit out excess-DO NOT swallow. DO NOT rinse afterwards. Repeat nightly.  1 Tube  prn  . zolpidem (AMBIEN) 10 MG tablet Take 10 mg by mouth at bedtime.       [name] NASONEX 50 MCG/ACT nasal spray Place 2 sprays into the nose daily as needed.        No current facility-administered medications for this encounter.    Physical Findings: The patient is in no acute distress. Patient is alert and oriented.  height is 6\' 1"  (1.854 m) and weight is 210 lb 9.6 oz (95.528 kg). His temperature is 97.8 F (36.6 C). His blood pressure is 131/83 and his pulse is 74. .   General: Alert and oriented, in no acute distress HEENT: Head is normocephalic. Extraocular movements are intact. Oropharynx is clear and slightly dry. Tympanic membranes are clear. No cerumen or fluid appreciated. Neck: no palpable cervical or supraclavicular lymphadenopathy. Skin/tissue a little thicker on the left side consistent with scar tissue.  No significant edema. Psychiatric: Judgment and insight are intact. Affect is appropriate.    Lab Findings: Lab Results  Component Value Date   WBC 4.4 01/08/2013   HGB 12.7* 01/08/2013   HCT 38.2* 01/08/2013  MCV 91.0 01/08/2013   PLT 156 01/08/2013    Radiographic Findings: No results found.  Impression/Plan:    1) Head and Neck Cancer Status: No evidence of recurrence or progression  PET pending for October.  2) Nutritional Status: He is eating well. PEG tube has been removed.   3) Risk Factors: The patient has been educated about risk factors including alcohol and tobacco abuse; they understand that avoidance of alcohol and tobacco is important to prevent recurrences as well as other cancers.   4) Swallowing: Good: No active issues   5) Dental: He has healed from prior bone sequestion.  Continues fluoride rinses, regular checkups  6) Energy: Good. Exercise declined due to leg injury. TSH normal in May.   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   10) Neurologic issues with working diagnosis of radiation myelopathy - satisfactory status. Off narcotics, continue Lyrica.  11) Follow-up  With med/onc and PET scan in Oct.  Advised to Call Dr. Jearld Fenton to reinitiate contact / visits with ENT.  Ideally see ENT in Dec or January.  We will schedule f/u with me in March 2015.  The patient was encouraged to call with any issues or questions before then.    I spent 20 minutes minutes face to face with the patient and more than 50% of that time was spent in counseling and/or coordination of care. _____________________________________   Lonie Peak, MD

## 2013-03-15 ENCOUNTER — Encounter (HOSPITAL_BASED_OUTPATIENT_CLINIC_OR_DEPARTMENT_OTHER): Payer: Managed Care, Other (non HMO) | Attending: Internal Medicine

## 2013-03-15 DIAGNOSIS — L97809 Non-pressure chronic ulcer of other part of unspecified lower leg with unspecified severity: Secondary | ICD-10-CM | POA: Insufficient documentation

## 2013-04-02 ENCOUNTER — Other Ambulatory Visit: Payer: Self-pay

## 2013-04-02 ENCOUNTER — Encounter (INDEPENDENT_AMBULATORY_CARE_PROVIDER_SITE_OTHER): Payer: Self-pay

## 2013-04-02 ENCOUNTER — Other Ambulatory Visit (HOSPITAL_BASED_OUTPATIENT_CLINIC_OR_DEPARTMENT_OTHER): Payer: Managed Care, Other (non HMO) | Admitting: Lab

## 2013-04-02 ENCOUNTER — Encounter (HOSPITAL_COMMUNITY)
Admission: RE | Admit: 2013-04-02 | Discharge: 2013-04-02 | Disposition: A | Discharge status: Home / Self Care | Payer: Managed Care, Other (non HMO) | Source: Ambulatory Visit | Attending: Physician Assistant | Admitting: Physician Assistant

## 2013-04-02 DIAGNOSIS — C109 Malignant neoplasm of oropharynx, unspecified: Secondary | ICD-10-CM

## 2013-04-02 DIAGNOSIS — C770 Secondary and unspecified malignant neoplasm of lymph nodes of head, face and neck: Secondary | ICD-10-CM

## 2013-04-02 DIAGNOSIS — C760 Malignant neoplasm of head, face and neck: Secondary | ICD-10-CM

## 2013-04-02 LAB — COMPREHENSIVE METABOLIC PANEL (CC13)
Alkaline Phosphatase: 69 U/L (ref 40–150)
Anion Gap: 10 mEq/L (ref 3–11)
BUN: 11.5 mg/dL (ref 7.0–26.0)
CO2: 25 mEq/L (ref 22–29)
Creatinine: 0.8 mg/dL (ref 0.7–1.3)
Glucose: 100 mg/dl (ref 70–140)
Total Bilirubin: 0.67 mg/dL (ref 0.20–1.20)
Total Protein: 6.4 g/dL (ref 6.4–8.3)

## 2013-04-02 LAB — CBC WITH DIFFERENTIAL/PLATELET
Eosinophils Absolute: 0.1 10*3/uL (ref 0.0–0.5)
HGB: 13.9 g/dL (ref 13.0–17.1)
LYMPH%: 21.8 % (ref 14.0–49.0)
MONO#: 0.4 10*3/uL (ref 0.1–0.9)
MONO%: 13 % (ref 0.0–14.0)
NEUT%: 60.6 % (ref 39.0–75.0)

## 2013-04-02 LAB — TSH CHCC: TSH: 2.367 m(IU)/L (ref 0.320–4.118)

## 2013-04-02 LAB — GLUCOSE, CAPILLARY: Glucose-Capillary: 89 mg/dL (ref 70–99)

## 2013-04-02 MED ORDER — FLUDEOXYGLUCOSE F - 18 (FDG) INJECTION
19.1000 | Freq: Once | INTRAVENOUS | Status: AC | PRN
  Administered 2013-04-02: 19.1 via INTRAVENOUS

## 2013-04-09 ENCOUNTER — Other Ambulatory Visit: Payer: Self-pay | Admitting: Oncology

## 2013-04-09 ENCOUNTER — Encounter (INDEPENDENT_AMBULATORY_CARE_PROVIDER_SITE_OTHER): Payer: Self-pay

## 2013-04-09 ENCOUNTER — Ambulatory Visit (HOSPITAL_BASED_OUTPATIENT_CLINIC_OR_DEPARTMENT_OTHER): Payer: Managed Care, Other (non HMO) | Admitting: Oncology

## 2013-04-09 VITALS — BP 116/75 | HR 58 | Temp 97.6°F | Resp 19 | Ht 73.0 in | Wt 214.6 lb

## 2013-04-09 DIAGNOSIS — C109 Malignant neoplasm of oropharynx, unspecified: Secondary | ICD-10-CM

## 2013-04-09 DIAGNOSIS — C760 Malignant neoplasm of head, face and neck: Secondary | ICD-10-CM

## 2013-04-09 DIAGNOSIS — C770 Secondary and unspecified malignant neoplasm of lymph nodes of head, face and neck: Secondary | ICD-10-CM

## 2013-04-09 DIAGNOSIS — H02409 Unspecified ptosis of unspecified eyelid: Secondary | ICD-10-CM

## 2013-04-09 NOTE — Progress Notes (Signed)
ID: Steven Robinson   DOB: Apr 17, 1951  MR#: [id number]  WUJ#:[id number]  PCP:  SU: Violeta Gelinas MD; Suzanna Obey MD OTHER MD: Lonie Peak, Levert Feinstein, Shirlean Kelly, Tamera Punt, Barron Alvine.   HISTORY OF PRESENT ILLNESS: HPI: The patient noted an enlarging left neck mass early March 2013, and brought this to the attention of his PCP, Dr. Dewain Penning. She obtained a neck CT at [facility] 08/20/2011, which showed multiple enlarged nodes in the left mid-neck along the jugular v., the largest 2.0 cm. She referred the patient to Suzanna Obey in ENT and FNA of one of the enlarged nodes was positive for squamous cell carcinoma ([id number]). A p16 stain was read as equivocal. PET scan 09/03/2011 showed SUVs >6 in the left oropharynx and left jugular chain nodes; SUV of 5.49 was noted in the larynx. There was no evidence of contralateral or disseminated disease.  This was consistent with a clinical T1 N2,stage IVA left oropharyngeal SCCA. However multiple biopsies under Dr. Jearld Fenton 09/09/2011 [[id number]] were all negative. The pathologic staging therefore is TX N2, which impacts radiation planning.  The patient met with Drs. Cruzita Lederer, and IMRT with concurrent chemosensitization was planned. The patient had dental evaluation under Dr. Kristin Bruins, several extractions under Dr. Chales Salmon, had audiometry (showing mild high-frequency hearing loss) and met with nutrition. A PEG tube placed by IR unfortunately became dislodged and infected, and had to be replaced through an open procedure under Violeta Gelinas. This delayed the start of chemotherapy and caused a change in plan from the original CDDP at 100 mg/m2 Q3w to 40 mg/M2 weekly. Radiation was started 10/04/2011 and chemotherapy 10/25/2011. The patient's subsequent history is as detailed below.   INTERVAL HISTORY: Steven Robinson returns today for followup of his head and neck cancer. His wife did not come partly because she has a cold and partly because aerating you his PET scan was  "perfect".  REVIEW OF SYSTEMS: Steven Robinson feels "good" overall. He took a little time off in January, but now is back to work full-time and doing a lot of traveling, more than he would like. He drinks a lot of water with meals and the good news there is an extensive taste is a little bit better. He has normal bowel movements. He takes Colace 3 times a day to make sure there is no constipation. After his accident in the Spring he was not able to take walks, but now that the wound in the left lower leg has almost completely healed he is walking 2-3 miles daily. The only pain he has is in the ulnar distribution of the right upper extremity, chiefly involving the fourth and fifth digits. He takes Peyton Najjar For this. When he stopped the Lyrica but it wasn't unbearable but it was more noticeable". He is tolerating the Lyrica without any side effects that he is aware of although sometimes he feels a little bit dizzy. My feeling is that this is going to be due to the autonomic dysfunction from the cis-platinum he received and in fact when he stopped alert her for a while he still had the occasional dizziness problem. A detailed review of systems today is otherwise noncontributory  PAST MEDICAL HISTORY: Past Medical History  Diagnosis Date  . Hyperlipoproteinemia   . Obstructive sleep apnea     on C-pap.   [name] GERD (gastroesophageal reflux disease)     chronic  . Hx of colonic polyps   . 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  Vertigo     2/2 intersitial neuritis.   [name] Hearing loss of left ear   . BPH (benign prostatic hyperplasia)   . Hyperlipidemia   . Mucositis 11/01/2011  . S/P radiation therapy 10/04/11-11/22/11    Mucosal Axis/Neck:7000cGy/35Fractions  . Status post chemotherapy     CDDP During with Radiation Therapy  . Gastrostomy tube dependent     removed Oct. 2013  . 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 Neuromuscular disorder      Radiation Myelopathy  . History of EKG     done at PCP office, T.  [facility]2012-06-07  . High cholesterol   . Xerostomia   . Pain     right arm and shoulder  . Ptosis of left eyelid   . Neuropathy     right upper extremity  . Right inguinal hernia   . Ptosis, left eyelid   . Fracture of fibula 7/14    left    PAST SURGICAL HISTORY: Past Surgical History  Procedure Laterality Date  . Fine needle aspiration  08/26/11     LEFT NECK FNA, LYMPH NODE, METASTATIC SQUAMOUS CELL CARCINOMA  . Gastrostomy  09/24/2011    Procedure: GASTROSTOMY;  Surgeon: Liz Malady, MD;  Location: [facility] OR;  Service: General;  Laterality: N/A;  open G-tube placement procedure start @1219   . Rigid esophagoscopy  09/24/2011    Procedure: RIGID ESOPHAGOSCOPY;  Surgeon: Suzanna Obey, MD;  Location: [facility] OR;  Service: ENT;  Laterality: N/A;  Esophagoscopy procedure end @1210   . Hernia repair  1990    Left  . Diagnostic laparoscopy      g-tube replacement   . Ventral hernia repair  04/24/2012    Procedure: LAPAROSCOPIC VENTRAL HERNIA;  Surgeon: Liz Malady, MD;  Location: [name] OR;  Service: General;  Laterality: N/A;  laparoscopic repair incisional hernia with mesh  . Inguinal hernia repair  04/24/2012    Procedure: HERNIA REPAIR INGUINAL ADULT;  Surgeon: Liz Malady, MD;  Location: [facility] OR;  Service: General;  Laterality: Right;  . Insertion of mesh  04/24/2012    Procedure: INSERTION OF MESH;  Surgeon: Liz Malady, MD;  Location: [facility] OR;  Service: General;  Laterality: Right;  . Removal bony sequestrum Left 09/29/12    Left mandible / Area #17  . Peg tube removal  03/2012    FAMILY HISTORY Family History  Problem Relation Age of Onset  . Alzheimer's disease Mother   . Tics Mother   . Hypertension Father   . Cancer Father 37    Prostate  . Stroke Paternal Grandmother   . Cancer Paternal Grandfather     Lung  and Prostate   the patient's father is alive at age 70. He lives in a retirement community. The patient's mother died at the age of 18 in Nov 18, 2009; she had  significant Alzheimer's disease. The patient is an only child.  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 His wife Steven Robinson is a Music therapist in oil. The live on 8 acres and have 2 large horses, a donkey, a Engineer, manufacturing, chickens, etc. The patient's daughter Steven Robinson lives in [place] and is currently looking for a job. The patient's son Steven Robinson works for an Economist. The patient has 2 grandchildren, granddaughter age 73 and a grandson aged 8 months. He attends the [employer].  ADVANCED DIRECTIVES: in place  HEALTH MAINTENANCE: History  Substance Use Topics  .  Smoking status: Former Smoker -- 0.25 packs/day for 3 years    Quit date: 06/14/1972  . Smokeless tobacco: Not on file  . Alcohol Use: No     Comment: Hx of Drinking wine - Stopped 3 years ago     Allergies  Allergen Reactions  . Flexeril [Cyclobenzaprine] Anxiety    Confusion    Current Outpatient Prescriptions  Medication Sig Dispense Refill  . aspirin EC 81 MG tablet Take 81 mg by mouth daily.      [name] dexlansoprazole (DEXILANT) 60 MG capsule Take 60 mg by mouth at bedtime.       . docusate sodium (COLACE) 100 MG capsule Take 100 mg by mouth 3 (three) times daily.      [name] NASONEX 50 MCG/ACT nasal spray Place 2 sprays into the nose daily as needed.       . pregabalin (LYRICA) 100 MG capsule Take 1 capsule (100 mg total) by mouth 4 (four) times daily.  360 capsule  3  . rosuvastatin (CRESTOR) 40 MG tablet Take 20 mg by mouth at bedtime.       . sodium fluoride (PREVIDENT 5000 PLUS) 1.1 % CREA dental cream Apply thin ribbon of cream to tooth brush. Brush teeth for 2 minutes. Spit out excess-DO NOT swallow. DO NOT rinse afterwards. Repeat nightly.  1 Tube  prn  . zolpidem (AMBIEN) 10 MG tablet Take 10 mg by mouth at bedtime.        No current facility-administered medications for this visit.    OBJECTIVE:  Middle-aged white male looks well Filed Vitals:   04/09/13 1444  BP: 116/75  Pulse: 58  Temp: 97.6 F (36.4 C)  Resp: 19     Body mass index is 28.32 kg/(m^2).    ECOG FS: 0 Filed Weights   04/09/13 1444  Weight: 214 lb 9.6 oz (97.342 kg)   Sclerae unicteric; chronic mild left ptosis Oropharynx clear, moderately moist, no thrush or ulcers No cervical or supraclavicular adenopathy; good cervical range of motion Lungs clear to auscultation bilaterally Heart regular rate and rhythm, no murmur appreciated Abdomen  soft, positive bowel sounds, no tenderness to palpation MSK: No focal spinal tendernes; contraction fifth digit right hand, chronic Left lower extremity bandage was not uncovered today Neuro: Nonfocal except for stable neuropathy the right upper extremity well-oriented; positive affect   LAB RESULTS: Results for Steven, Robinson (MRN [id number]) as of 04/09/2013 14:49  Ref. Range 04/02/2013 09:13  TSH Latest Range: 0.320-4.118 m(IU)/L 2.367    Lab Results  Component Value Date   WBC 3.4* 04/02/2013   NEUTROABS 2.0 04/02/2013   HGB 13.9 04/02/2013   HCT 41.2 04/02/2013   MCV 88.1 04/02/2013   PLT 137* 04/02/2013      Chemistry      Component Value Date/Time   NA 143 04/02/2013 0913   NA 137 04/21/2012 1127   K 4.0 04/02/2013 0913   K 4.3 04/21/2012 1127   CL 104 10/03/2012 0803   CL 99 04/21/2012 1127   CO2 25 04/02/2013 0913   CO2 30 04/21/2012 1127   BUN 11.5 04/02/2013 0913   BUN 17 04/21/2012 1127   CREATININE 0.8 04/02/2013 0913   CREATININE 0.73 04/21/2012 1127      Component Value Date/Time   CALCIUM 8.9 04/02/2013 0913   CALCIUM 9.5 04/21/2012 1127   ALKPHOS 69 04/02/2013 0913   ALKPHOS 62 12/13/2011 0950   AST 15 04/02/2013 0913   AST 18 12/13/2011 0950   ALT  11 04/02/2013 0913   ALT 13 12/13/2011 0950   BILITOT 0.67 04/02/2013 0913   BILITOT 0.4 12/13/2011 0950       STUDIES: Nm Pet Image Restag (ps) Skull Base To Thigh  04/02/2013   CLINICAL DATA:   Subsequent treatment strategy for head and neck carcinoma.  EXAM: NUCLEAR MEDICINE PET SKULL BASE TO THIGH  FASTING BLOOD GLUCOSE:  Value:  89 mg/dl  TECHNIQUE: 16.1 mCi W-96 FDG was injected intravenously. CT data was obtained and used for attenuation correction and anatomic localization only. (This was not acquired as a diagnostic CT examination.) Additional exam technical data entered on technologist worksheet.  COMPARISON:  CT on 10/16/2012 and PET-CT on 06/12/2012  FINDINGS: NECK  No hypermetabolic lymph nodes in the neck.  CHEST  No hypermetabolic mediastinal or hilar nodes. No suspicious pulmonary nodules on the CT scan.  ABDOMEN/PELVIS  No abnormal hypermetabolic activity within the liver, pancreas, adrenal glands, or spleen. No hypermetabolic lymph nodes in the abdomen or pelvis.  SKELETON  No focal hypermetabolic activity to suggest skeletal metastasis.  IMPRESSION: Negative. No evidence of recurrent or metastatic carcinoma.   Electronically Signed   By: Myles Rosenthal M.D.   On: 04/02/2013 13:31     ASSESSMENT: 62 y.o. 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 diagnosed by FNA of a left cervical node March 2013, treated with IMRT completed 11/22/2011 and concurrent weekly cis-platinum, last dose 11/23/2011  (1) risk factors are smoking history (minimal and remote), significant alcohol use (discontinued 2010), and an equivocal p16 probe  (2) dizziness, possibly due partly to autonomic dysfunction from cis-platinum, resolved  (3) malnutrition: PEG removed October 2013, stopped Boost supplements February 2014; maintaining weight  (4) dry mouth: discontinued pilocarpine and rinses; drinks extra water during meals  (5) at risk of hypothyroidism: TSH normal OCT 2014  (6) right hand/arm/shoulder pain: 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 It is very gratifying that he is almost a year and a half out from the end of his treatment with no evidence of disease recurrence.  His left lower extremity is finally recovering from the trauma he suffered some months ago, and he is now able to walk more regularly. He is drinking a lot to help himself eat, and is maintaining his weight without any supplements. His pain which is mostly in the ulnar division of the right upper extremity is well controlled on his current Lyrica and he has not used narcotics now for some time.  He is very concerned that his PSA was going up rapidly last time it was checked by urology. We are going to check that again at with an excellent labs which will be in March. I will repeat a PSA at that time as well, since there has been a slight rise there and he is at risk for hypothyroidism secondary to his radiation.  Overall I am delighted to see Kmari doing so well. Incidentally they now have a rescue dog in training to be a therapy dog, and when they do get her trained they're  going to be bringing her here on a regular basis   Rye Decoste C    04/09/2013

## 2013-07-20 ENCOUNTER — Telehealth: Payer: Self-pay | Admitting: *Deleted

## 2013-07-20 NOTE — Telephone Encounter (Signed)
Pt called for an appt. gv appt for 09/03/13@ 2:30pm...td

## 2013-08-31 ENCOUNTER — Ambulatory Visit: Payer: Managed Care, Other (non HMO) | Admitting: Radiation Oncology

## 2013-08-31 ENCOUNTER — Ambulatory Visit: Payer: Self-pay | Admitting: Radiation Oncology

## 2013-09-03 ENCOUNTER — Ambulatory Visit (HOSPITAL_BASED_OUTPATIENT_CLINIC_OR_DEPARTMENT_OTHER): Payer: Managed Care, Other (non HMO) | Admitting: Oncology

## 2013-09-03 ENCOUNTER — Other Ambulatory Visit: Payer: Self-pay | Admitting: *Deleted

## 2013-09-03 ENCOUNTER — Telehealth: Payer: Self-pay | Admitting: Oncology

## 2013-09-03 VITALS — BP 121/78 | HR 59 | Temp 97.9°F | Resp 18 | Ht 73.0 in | Wt 215.4 lb

## 2013-09-03 DIAGNOSIS — K117 Disturbances of salivary secretion: Secondary | ICD-10-CM

## 2013-09-03 DIAGNOSIS — H02409 Unspecified ptosis of unspecified eyelid: Secondary | ICD-10-CM

## 2013-09-03 DIAGNOSIS — C109 Malignant neoplasm of oropharynx, unspecified: Secondary | ICD-10-CM

## 2013-09-03 DIAGNOSIS — C770 Secondary and unspecified malignant neoplasm of lymph nodes of head, face and neck: Secondary | ICD-10-CM

## 2013-09-03 DIAGNOSIS — M5412 Radiculopathy, cervical region: Secondary | ICD-10-CM

## 2013-09-03 DIAGNOSIS — R42 Dizziness and giddiness: Secondary | ICD-10-CM

## 2013-09-03 DIAGNOSIS — C760 Malignant neoplasm of head, face and neck: Secondary | ICD-10-CM

## 2013-09-03 NOTE — Telephone Encounter (Signed)
, °

## 2013-09-03 NOTE — Progress Notes (Signed)
ID: Steven Robinson   DOB: [birth year]  MR#: [id number]  XTK#:[id number]  PCP:  SU: Steven Skeans MD; Steven Montane MD OTHER MD: Steven Robinson, Steven Robinson, Steven Robinson, Steven Robinson, Steven Robinson, Steven Robinson   HISTORY OF HEAD AND NECK CANCER::  The patient noted an enlarging left neck mass early March 2013, and brought this to the attention of his PCP, Dr. Charleston Robinson. She obtained a neck CT at [facility] 08/20/2011, which showed multiple enlarged nodes in the left mid-neck along the jugular v., the largest 2.0 cm. She referred the patient to Steven Robinson in ENT and FNA of one of the enlarged nodes was positive for squamous cell carcinoma ([id number]). A p16 stain was read as equivocal. PET scan 09/03/2011 showed SUVs >6 in the left oropharynx and left jugular chain nodes; SUV of 5.49 was noted in the larynx. There was no evidence of contralateral or disseminated disease.  This was consistent with a clinical T1 N2,stage IVA left oropharyngeal SCCA. However multiple biopsies under Dr. Janace Robinson 09/09/2011 [[id number]] were all negative. The pathologic staging therefore is TX N2, which impacts radiation planning.  The patient met with Drs. Steven Robinson, and IMRT with concurrent chemosensitization was planned. The patient had dental evaluation under Dr. Enrique Robinson, several extractions under Dr. Benson Robinson, had audiometry (showing mild high-frequency hearing loss) and met with nutrition. A PEG tube placed by IR unfortunately became dislodged and infected, and had to be replaced through an open procedure under Steven Robinson. This delayed the start of chemotherapy and caused a change in plan from the original CDDP at 100 mg/m2 Q3w to 40 mg/M2 weekly. Radiation was started 10/04/2011 and chemotherapy 10/25/2011. The patient's subsequent history is as detailed below.   INTERVAL HISTORY: Steven Robinson returns today for followup of his head and neck cancer accompanied by his wife Steven Robinson. He continues to work full-time, doing quite a bit of  traveling. He exercises regularly walking 2-3 miles a day at least 5 days a week.  REVIEW OF SYSTEMS: Steven Robinson of course still has a dry mouth, but he is very expert at dealing with that, and that only prefers "wet" or ground foods but drinks quite a bit of liquid with his meals and also between meals. He is maintaining his weight between 202 110 pounds, which is great for him. He's had some balance problems which have been present for at least a year and are not more pronounced than before. There have been no falls. 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 He denies unusual headaches, visual changes, nausea, or vomiting. He has had no cough or phlegm production. He is concerned about his poor urinary stream and is seeing a new urologist, Steven Robinson in [facility]. Otherwise a detailed review of systems today was remarkably benign  PAST MEDICAL HISTORY: Past Medical History  Diagnosis Date  . Hyperlipoproteinemia   . Obstructive sleep apnea     on C-pap.   [name] GERD (gastroesophageal reflux disease)     chronic  . Hx of colonic polyps   . 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 Vertigo     2/2 intersitial neuritis.   [name] Hearing loss of left ear   . BPH (benign prostatic hyperplasia)   . Hyperlipidemia   . Mucositis 11/01/2011  . S/P radiation therapy 10/04/11-11/22/11    Mucosal Axis/Neck:7000cGy/35Fractions  . Status post chemotherapy     CDDP During with Radiation Therapy  . Gastrostomy tube dependent  removed Oct. 2013  . 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 Neuromuscular disorder      Radiation Myelopathy  . History of EKG     done at PCP office, T. Spears26-Mar-2012  . High cholesterol   . Xerostomia   . Pain     right arm and shoulder  . Ptosis of left eyelid   . Neuropathy     right upper extremity  . Right inguinal hernia   . Ptosis, left eyelid   . Fracture of fibula 7/14    left    PAST SURGICAL  HISTORY: Past Surgical History  Procedure Laterality Date  . Fine needle aspiration  08/26/11     LEFT NECK FNA, LYMPH NODE, METASTATIC SQUAMOUS CELL CARCINOMA  . Gastrostomy  09/24/2011    Procedure: GASTROSTOMY;  Surgeon: Steven Jarred, MD;  Location: [place];  Service: General;  Laterality: N/A;  open G-tube placement procedure start $RemoveBeforeD'@1219'gERifSEnUNrTaF$   . Rigid esophagoscopy  09/24/2011    Procedure: RIGID ESOPHAGOSCOPY;  Surgeon: Steven Montane, MD;  Location: [place];  Service: ENT;  Laterality: N/A;  Esophagoscopy procedure end $RemoveBefor'@1210'otlAQWoJMdyL$   . Hernia repair  1990    Left  . Diagnostic laparoscopy      g-tube replacement   . Ventral hernia repair  04/24/2012    Procedure: LAPAROSCOPIC VENTRAL HERNIA;  Surgeon: Steven Jarred, MD;  Location: [place];  Service: General;  Laterality: N/A;  laparoscopic repair incisional hernia with mesh  . Inguinal hernia repair  04/24/2012    Procedure: HERNIA REPAIR INGUINAL ADULT;  Surgeon: Steven Jarred, MD;  Location: [place];  Service: General;  Laterality: Right;  . Insertion of mesh  04/24/2012    Procedure: INSERTION OF MESH;  Surgeon: Steven Jarred, MD;  Location: [place];  Service: General;  Laterality: Right;  . Removal bony sequestrum Left 09/29/12    Left mandible / Area #17  . Peg tube removal  03/2012    FAMILY HISTORY Family History  Problem Relation Age of Onset  . Alzheimer's disease Mother   . Tics Mother   . Hypertension Father   . Cancer Father 2    Prostate  . Stroke Paternal Grandmother   . Cancer Paternal Grandfather     Lung  and Prostate   the patient's father is alive at age 15. He lives in a retirement community. The patient's mother died at the age of 53 in 09-06-2009; she had significant Alzheimer's disease. The patient is an only child.  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 His wife Steven Robinson is a Artist in oil. The live on 8 acres and  have 2 large horses, a donkey, a Clinical cytogeneticist, chickens, etc. The patient's daughter Caryl Pina lives in [place] and is currently looking for a job. The patient's son Yong Channel works for an Associate Professor. The patient has 2 grandchildren, granddaughter age 53 and a grandson aged 28 months. He attends the [employer]  ADVANCED DIRECTIVES: in place  HEALTH MAINTENANCE: History  Substance Use Topics  . Smoking status: Former Smoker -- 0.25 packs/day for 3 years    Quit date: 06/14/1972  . Smokeless tobacco: Not on file  . Alcohol Use: No     Comment: Hx of Drinking wine - Stopped 3 years ago     Allergies  Allergen Reactions  . Flexeril [Cyclobenzaprine] Anxiety    Confusion    Current Outpatient  Prescriptions  Medication Sig Dispense Refill  . aspirin EC 81 MG tablet Take 81 mg by mouth daily.      [name] dexlansoprazole (DEXILANT) 60 MG capsule Take 60 mg by mouth at bedtime.       . docusate sodium (COLACE) 100 MG capsule Take 100 mg by mouth 3 (three) times daily.      [name] NASONEX 50 MCG/ACT nasal spray Place 2 sprays into the nose daily as needed.       . pregabalin (LYRICA) 100 MG capsule Take 1 capsule (100 mg total) by mouth 4 (four) times daily.  360 capsule  3  . rosuvastatin (CRESTOR) 40 MG tablet Take 20 mg by mouth at bedtime.       . sodium fluoride (PREVIDENT 5000 PLUS) 1.1 % CREA dental cream Apply thin ribbon of cream to tooth brush. Brush teeth for 2 minutes. Spit out excess-DO NOT swallow. DO NOT rinse afterwards. Repeat nightly.  1 Tube  prn  . zolpidem (AMBIEN) 10 MG tablet Take 10 mg by mouth at bedtime.        No current facility-administered medications for this visit.    OBJECTIVE: Middle-aged white male in no acute distres1 Filed Vitals:   09/03/13 1500  BP: 121/78  Pulse: 59  Temp: 97.9 F (36.6 C)  Resp: 18     Body mass index is 28.42 kg/(m^2).    ECOG FS: 0 Filed Weights   09/03/13 1500  Weight: 215 lb 6.4 oz (97.705 kg)   Sclerae  unicteric; pupils round and equal; Oropharynx clear, moderately moist, no thrush or ulcers No cervical or supraclavicular adenopathy; good cervical range of motion Lungs clear to auscultation bilaterally Heart regular rate and rhythm, no murmur appreciated Abdomen  soft, positive bowel sounds, nontender MSK: No focal spinal tendernes; contraction fifth digit right hand, chronic Neuro: Nonfocal except for stable neuropathy the right upper extremity in the ulnar distribution; well-oriented; positive affect   LAB RESULTS:  Lab Results  Component Value Date   WBC 3.4* 04/02/2013   NEUTROABS 2.0 04/02/2013   HGB 13.9 04/02/2013   HCT 41.2 04/02/2013   MCV 88.1 04/02/2013   PLT 137* 04/02/2013      Chemistry      Component Value Date/Time   NA 143 04/02/2013 0913   NA 137 04/21/2012 1127   K 4.0 04/02/2013 0913   K 4.3 04/21/2012 1127   CL 104 10/03/2012 0803   CL 99 04/21/2012 1127   CO2 25 04/02/2013 0913   CO2 30 04/21/2012 1127   BUN 11.5 04/02/2013 0913   BUN 17 04/21/2012 1127   CREATININE 0.8 04/02/2013 0913   CREATININE 0.73 04/21/2012 1127      Component Value Date/Time   CALCIUM 8.9 04/02/2013 0913   CALCIUM 9.5 04/21/2012 1127   ALKPHOS 69 04/02/2013 0913   ALKPHOS 62 12/13/2011 0950   AST 15 04/02/2013 0913   AST 18 12/13/2011 0950   ALT 11 04/02/2013 0913   ALT 13 12/13/2011 0950   BILITOT 0.67 04/02/2013 0913   BILITOT 0.4 12/13/2011 0950       STUDIES: Nm Pet Image Restag (ps) Skull Base To Thigh  04/02/2013   CLINICAL DATA:  Subsequent treatment strategy for head and neck carcinoma.  EXAM: NUCLEAR MEDICINE PET SKULL BASE TO THIGH  FASTING BLOOD GLUCOSE:  Value:  89 mg/dl  TECHNIQUE: 19.1 mCi F-18 FDG was injected intravenously. CT data was obtained and used for attenuation correction and anatomic localization only. (This  was not acquired as a diagnostic CT examination.) Additional exam technical data entered on technologist worksheet.  COMPARISON:  CT on 10/16/2012  and PET-CT on 06/12/2012  FINDINGS: NECK  No hypermetabolic lymph nodes in the neck.  CHEST  No hypermetabolic mediastinal or hilar nodes. No suspicious pulmonary nodules on the CT scan.  ABDOMEN/PELVIS  No abnormal hypermetabolic activity within the liver, pancreas, adrenal glands, or spleen. No hypermetabolic lymph nodes in the abdomen or pelvis.  SKELETON  No focal hypermetabolic activity to suggest skeletal metastasis.  IMPRESSION: Negative. No evidence of recurrent or metastatic carcinoma.   Electronically Signed   By: Earle Gell M.D.   On: 04/02/2013 13:31    ASSESSMENT: 63 y.o. 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 diagnosed by FNA of a left cervical node March 2013, treated with IMRT completed 11/22/2011 and concurrent weekly cis-platinum, last dose 11/23/2011  (1) risk factors are smoking history (minimal and remote), significant alcohol use (discontinued 2010), and an equivocal p16 probe  (2) dizziness, possibly due to autonomic dysfunction from cis-platinum  (3) malnutrition: PEG removed October 2013, stopped Boost supplements February 2014; maintaining weight orally  (4) dry mouth: discontinued pilocarpine and rinses; drinks extra water during meals  (5) at risk of hypothyroidism: TSH normal OCT 2014  (6) right hand/arm/shoulder pain: 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 He has not seen Dr. Sofie Hartigan in a while and  it probably would be prudent to have him go back there for head and neck exam. He tells me he will make the arrangements directly. He is a ready scheduled to see Dr. Jannifer Franklin regarding his neuropathy. I think it would be useful to have a head MRI to document that she does not have metastatic disease as the basis of his balance issues. I think it would be prudent to do a chest x-ray at this point to make sure there have not been any long metastases, but I would not repeat a PET scan at this point. If he sees Dr. Isidore Moos in May, he can see [place] again in August and we can discuss a pad later this year at that time.  Daden is a good understanding of this plan. He agrees with that. He will call with any problems that may develop before his next visit here.  Able Malloy C    09/03/2013

## 2013-09-12 ENCOUNTER — Telehealth: Payer: Self-pay | Admitting: Oncology

## 2013-09-12 ENCOUNTER — Other Ambulatory Visit: Payer: Self-pay

## 2013-09-12 DIAGNOSIS — C760 Malignant neoplasm of head, face and neck: Secondary | ICD-10-CM

## 2013-09-12 NOTE — Telephone Encounter (Signed)
lmonvm advising the pt of his lab appt on 09/13/2013 prior to his x-ray appts

## 2013-09-13 ENCOUNTER — Ambulatory Visit (HOSPITAL_COMMUNITY)
Admission: RE | Admit: 2013-09-13 | Discharge: 2013-09-13 | Disposition: A | Discharge status: Home / Self Care | Payer: Managed Care, Other (non HMO) | Source: Ambulatory Visit | Attending: Oncology | Admitting: Oncology

## 2013-09-13 ENCOUNTER — Other Ambulatory Visit (HOSPITAL_BASED_OUTPATIENT_CLINIC_OR_DEPARTMENT_OTHER): Payer: Managed Care, Other (non HMO)

## 2013-09-13 ENCOUNTER — Other Ambulatory Visit: Payer: Self-pay | Admitting: Oncology

## 2013-09-13 DIAGNOSIS — C109 Malignant neoplasm of oropharynx, unspecified: Secondary | ICD-10-CM

## 2013-09-13 DIAGNOSIS — C760 Malignant neoplasm of head, face and neck: Secondary | ICD-10-CM

## 2013-09-13 DIAGNOSIS — J320 Chronic maxillary sinusitis: Secondary | ICD-10-CM | POA: Insufficient documentation

## 2013-09-13 DIAGNOSIS — C770 Secondary and unspecified malignant neoplasm of lymph nodes of head, face and neck: Secondary | ICD-10-CM

## 2013-09-13 LAB — CBC WITH DIFFERENTIAL/PLATELET
BASO%: 0.7 % (ref 0.0–2.0)
BASOS ABS: 0 10*3/uL (ref 0.0–0.1)
EOS%: 3.1 % (ref 0.0–7.0)
Eosinophils Absolute: 0.1 10*3/uL (ref 0.0–0.5)
HEMATOCRIT: 41.6 % (ref 38.4–49.9)
HEMOGLOBIN: 13.9 g/dL (ref 13.0–17.1)
LYMPH%: 20.1 % (ref 14.0–49.0)
MCH: 29.8 pg (ref 27.2–33.4)
MCHC: 33.5 g/dL (ref 32.0–36.0)
MCV: 88.9 fL (ref 79.3–98.0)
MONO#: 0.3 10*3/uL (ref 0.1–0.9)
MONO%: 8.2 % (ref 0.0–14.0)
NEUT#: 2.7 10*3/uL (ref 1.5–6.5)
NEUT%: 67.9 % (ref 39.0–75.0)
PLATELETS: 152 10*3/uL (ref 140–400)
RBC: 4.68 10*6/uL (ref 4.20–5.82)
RDW: 13.7 % (ref 11.0–14.6)
WBC: 4 10*3/uL (ref 4.0–10.3)
lymph#: 0.8 10*3/uL — ABNORMAL LOW (ref 0.9–3.3)

## 2013-09-13 LAB — COMPREHENSIVE METABOLIC PANEL (CC13)
ALT: 13 U/L (ref 0–55)
ANION GAP: 13 meq/L — AB (ref 3–11)
AST: 14 U/L (ref 5–34)
Albumin: 3.7 g/dL (ref 3.5–5.0)
Alkaline Phosphatase: 68 U/L (ref 40–150)
BILIRUBIN TOTAL: 0.5 mg/dL (ref 0.20–1.20)
BUN: 10.2 mg/dL (ref 7.0–26.0)
CALCIUM: 8.9 mg/dL (ref 8.4–10.4)
CO2: 21 meq/L — AB (ref 22–29)
CREATININE: 0.8 mg/dL (ref 0.7–1.3)
Chloride: 109 mEq/L (ref 98–109)
Glucose: 131 mg/dl (ref 70–140)
Potassium: 3.8 mEq/L (ref 3.5–5.1)
Sodium: 142 mEq/L (ref 136–145)
Total Protein: 6.3 g/dL — ABNORMAL LOW (ref 6.4–8.3)

## 2013-09-13 LAB — TSH CHCC: TSH: 2.805 m(IU)/L (ref 0.320–4.118)

## 2013-09-13 MED ORDER — GADOBENATE DIMEGLUMINE 529 MG/ML IV SOLN
20.0000 mL | Freq: Once | INTRAVENOUS | Status: AC | PRN
  Administered 2013-09-13: 20 mL via INTRAVENOUS

## 2013-09-14 ENCOUNTER — Telehealth: Payer: Self-pay | Admitting: *Deleted

## 2013-09-14 DIAGNOSIS — J322 Chronic ethmoidal sinusitis: Secondary | ICD-10-CM

## 2013-09-14 LAB — PSA: PSA: 3.2 ng/mL (ref <=4.00)

## 2013-09-14 MED ORDER — AZITHROMYCIN 250 MG PO TABS: ORAL_TABLET | ORAL | Status: DC

## 2013-09-14 NOTE — Telephone Encounter (Signed)
Attempted to call patient back with MRI results,no answer, left brief message, the MRI looks good and no evidence of cancer, but incidental findings of sinusitis which may be the cause of patient symptoms. Per Dr Jana Hakim, ok to order Zpak and Mucinex for patient. Before completing this note, patient called back, he is used to using a Nettie pot and Nasonex for sinuses, he will start that with the Zpak and OTC Mucinex DM. He understands to call for fevers or chills.

## 2013-09-17 ENCOUNTER — Other Ambulatory Visit: Payer: Self-pay | Admitting: Oncology

## 2013-11-09 ENCOUNTER — Ambulatory Visit
Admission: RE | Admit: 2013-11-09 | Discharge: 2013-11-09 | Disposition: A | Discharge status: Home / Self Care | Payer: Managed Care, Other (non HMO) | Source: Ambulatory Visit | Attending: Radiation Oncology | Admitting: Radiation Oncology

## 2013-11-09 ENCOUNTER — Encounter: Payer: Self-pay | Admitting: Radiation Oncology

## 2013-11-09 VITALS — BP 119/80 | HR 53 | Temp 97.5°F | Ht 73.0 in | Wt 214.0 lb

## 2013-11-09 DIAGNOSIS — C770 Secondary and unspecified malignant neoplasm of lymph nodes of head, face and neck: Secondary | ICD-10-CM

## 2013-11-09 NOTE — Progress Notes (Signed)
Radiation Oncology         [phone] ________________________________  Name: Steven Robinson MRN: [id number]  Date: 11/09/2013  DOB: 1950-12-08  Follow-Up Visit Note  CC: Florina Ou, MD  Melissa Montane, MD  Diagnosis and Prior Radiotherapy:  [id number] Squamous cell carcinoma, head and neck, unknown primary . Presented with left neck nodes.  Interval Since Last Radiation: Completed 70 Gy/35 fractions on 11-22-11 with concurrent cis-platinum  Narrative:  The patient returns today for routine follow-up.  Mr. Moodie reports that he still has dry mouth and keeps bottled water available wherever he goes. He denies any oral irritation at this time. Skin on neck with some remaining hyperpigmentation. MRI brain and CXR recently done as part of workup by med onc for dizziness in the AM/ balance issues. MRI remarkable for L mastoid effusion and maxillary sinusitis.  Underwent  L tympanostomy procedure by Dr Janace Hoard with some improvement in L tinnitus and balance.  He spoke in front of 300 people last night at our [place] reception. Working, but enjoying a better balance now with exercise/ family, etc. Doing well.                        ALLERGIES:  is allergic to flexeril.  Meds: Current Outpatient Prescriptions  Medication Sig Dispense Refill  . aspirin EC 81 MG tablet Take 81 mg by mouth daily.      [name] dexlansoprazole (DEXILANT) 60 MG capsule Take 60 mg by mouth at bedtime.       . docusate sodium (COLACE) 100 MG capsule Take 100 mg by mouth 3 (three) times daily.      [name] MYRBETRIQ 50 MG TB24 tablet       . pregabalin (LYRICA) 100 MG capsule Take 200 mg by mouth 3 (three) times daily.      . rosuvastatin (CRESTOR) 40 MG tablet Take 20 mg by mouth at bedtime.       . tamsulosin (FLOMAX) 0.4 MG CAPS capsule Take 0.4 mg by mouth daily.      [name] zolpidem (AMBIEN) 10 MG tablet Take 10 mg by mouth at bedtime.       [name] NASONEX 50 MCG/ACT nasal spray Place 2 sprays into the nose daily as needed.         No current facility-administered medications for this encounter.    Physical Findings: The patient is in no acute distress. Patient is alert and oriented.  height is 6\' 1"  (1.854 m) and weight is 214 lb (97.07 kg). His temperature is 97.5 F (36.4 C). His blood pressure is 119/80 and his pulse is 53. .  Well appearing. No oropharyngeal  lesions or thrush. No neck adenopathy in cervical or supraclavicular regions.  Lab Findings: Lab Results  Component Value Date   WBC 4.0 09/13/2013   HGB 13.9 09/13/2013   HCT 41.6 09/13/2013   MCV 88.9 09/13/2013   PLT 152 09/13/2013    Lab Results  Component Value Date   TSH 2.805 09/13/2013    Radiographic Findings: No results found.  Impression/Plan:    1) Head and Neck Cancer Status:  NED  2) Nutritional Status: no issues - PEG tube: removed  3) Risk Factors: The patient has been educated about risk factors including alcohol and tobacco abuse; they understand that avoidance of alcohol and tobacco is important to prevent recurrences as well as other cancers  4) Swallowing: No issues  5) Dental: He has healed from prior bone sequestion.  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   10) Neurologic issues with working diagnosis of radiation myelopathy - satisfactory status, but residual symptoms in right hand. Still on Lyrica, no narcotics.  11) Other: MRI brain, CXR clear in April  12) Follow-up in 60months. The patient was encouraged to call with any issues or questions before then. ENT f/u to continue(and Hem Onc f/u in Aug.) _____________________________________   Eppie Gibson, MD

## 2013-11-09 NOTE — Progress Notes (Signed)
Steven Robinson reports that he still has dry mouth and keeps bottled water available wherever he goes.  He denies any oral irritation at this time.  Skin on neck with some remaining hyperpigmentation.

## 2014-01-02 ENCOUNTER — Encounter: Payer: Self-pay | Admitting: *Deleted

## 2014-01-04 ENCOUNTER — Encounter: Payer: Self-pay | Admitting: Neurology

## 2014-01-04 ENCOUNTER — Ambulatory Visit (INDEPENDENT_AMBULATORY_CARE_PROVIDER_SITE_OTHER): Payer: Managed Care, Other (non HMO) | Admitting: Neurology

## 2014-01-04 ENCOUNTER — Ambulatory Visit (INDEPENDENT_AMBULATORY_CARE_PROVIDER_SITE_OTHER): Payer: Managed Care, Other (non HMO)

## 2014-01-04 ENCOUNTER — Telehealth: Payer: Self-pay | Admitting: Neurology

## 2014-01-04 VITALS — BP 122/85 | HR 62 | Wt 225.0 lb

## 2014-01-04 DIAGNOSIS — R42 Dizziness and giddiness: Secondary | ICD-10-CM

## 2014-01-04 DIAGNOSIS — M5412 Radiculopathy, cervical region: Secondary | ICD-10-CM

## 2014-01-04 HISTORY — DX: Dizziness and giddiness: R42

## 2014-01-04 MED ORDER — PREGABALIN 100 MG PO CAPS: 100.0000 mg | ORAL_CAPSULE | Freq: Three times a day (TID) | ORAL | Status: DC

## 2014-01-04 NOTE — Patient Instructions (Signed)

## 2014-01-04 NOTE — Procedures (Signed)
    History:  Steven Robinson is a 63 year old gentleman with a history of squamous cell carcinoma of the neck status post radiation therapy and chemotherapy. He has developed some decreased hearing in the left these meds. He is being evaluated for possible peripheral nerve injury.  Description: The brainstem auditory evoked response test was performed today using 85 dB rarefraction clicks in the ipsilateral ear and 40 dB masking noise in the contralateral ear. The absolute latencies for waveforms I, and V were within normal limits on the right. Waveform III was slightly prolonged on the right. On the left, the absolute latency for waveform I was normal, prolonged for waveforms III and V. The interpeak latencies for waveforms I-III and III-V were within normal limits on the left, with prolongation of the I-V interpeak latency. On the right, there is prolongation of the I-III and the I-V interpeak latencies, with a normal interpeak latency for III-V. The amplitudes of waveforms I and V were within normal limits bilaterally.  Impression:  This is an abnormal brainstem auditory evoked response test suggesting central conduction slowing in the lower brainstem on the right, and mild diffuse slowing centrally on the left. There does not appear to be evidence of peripheral conduction slowing on this study on either side.

## 2014-01-04 NOTE — Telephone Encounter (Signed)
I called patient. The brainstem auditory evoked response test shows central slowing, not peripheral swelling in the brainstem. I discussed this with them, still could potentially be related to radiation changes.

## 2014-01-04 NOTE — Progress Notes (Signed)
Reason for visit: Dizziness  Steven Robinson is a 63 y.o. 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 The patient underwent chemotherapy that included cisplatin and underwent radiation therapy of the left neck. The patient developed significant vertigo in June of 2013, 2 months after onset of radiation therapy. The patient was told that he also had some cisplatin-induced hearing changes in the left ear. The patient indicates that the vertigo has improved over time, but still is present. The dizziness occurs when he first gets up in the morning, and has to sit on the bed for several moments before he gets to walk. If he bends over and stands up quickly he may get dizzy. He has had very little limitation in his activities of daily living, and he remains quite active. He no longer climbs ladders. The patient has had some right hand numbness and weakness in the ulnar aspect that is a residual from the radiation-induced myelopathy. The patient takes Lyrica for some discomfort, but the pain level has significantly improved. The patient denies any bowel or bladder control issues, but he does have some mild balance problems. He does have some decreased hearing in the left ear, and he is considering a hearing aid. He has had myringotomy tube placed in the left ear. He is followed by Dr. Janace Hoard. The patient is sent to this office for further evaluation.  Past Medical History  Diagnosis Date  . Hyperlipoproteinemia   . Obstructive sleep apnea     on C-pap.   [name] GERD (gastroesophageal reflux disease)     chronic  . Hx of colonic polyps   . 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 Vertigo     2/2 intersitial neuritis.   [name] Hearing loss of left ear   . BPH (benign prostatic hyperplasia)   . Hyperlipidemia   . Mucositis 11/01/2011  . S/P radiation therapy  10/04/11-11/22/11    Mucosal Axis/Neck:7000cGy/35Fractions  . Status post chemotherapy     CDDP During with Radiation Therapy  . Gastrostomy tube dependent     removed Oct. 2013  . 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 Neuromuscular disorder      Radiation Myelopathy  . History of EKG     done at PCP office, T. Spears- 2012  . High cholesterol   . Xerostomia   . Pain     right arm and shoulder  . Ptosis of left eyelid   . Neuropathy     right upper extremity  . Right inguinal hernia   . Ptosis, left eyelid   . Fracture of fibula 7/14    left  . Dizziness and giddiness 01/04/2014    Past Surgical History  Procedure Laterality Date  . Fine needle aspiration  08/26/11     LEFT NECK FNA, LYMPH NODE, METASTATIC SQUAMOUS CELL CARCINOMA  . Gastrostomy  09/24/2011    Procedure: GASTROSTOMY;  Surgeon: Zenovia Jarred, MD;  Location: [place];  Service: General;  Laterality: N/A;  open G-tube placement procedure start @1219   . Rigid esophagoscopy  09/24/2011    Procedure: RIGID ESOPHAGOSCOPY;  Surgeon: Melissa Montane, MD;  Location: [place];  Service: ENT;  Laterality: N/A;  Esophagoscopy procedure end @1210   . Hernia repair  1990    Left  . Diagnostic laparoscopy      g-tube replacement   .  Ventral hernia repair  04/24/2012    Procedure: LAPAROSCOPIC VENTRAL HERNIA;  Surgeon: Zenovia Jarred, MD;  Location: [place];  Service: General;  Laterality: N/A;  laparoscopic repair incisional hernia with mesh  . Inguinal hernia repair  04/24/2012    Procedure: HERNIA REPAIR INGUINAL ADULT;  Surgeon: Zenovia Jarred, MD;  Location: [place];  Service: General;  Laterality: Right;  . Insertion of mesh  04/24/2012    Procedure: INSERTION OF MESH;  Surgeon: Zenovia Jarred, MD;  Location: [place];  Service: General;  Laterality: Right;  . Removal bony sequestrum Left 09/29/12    Left mandible / Area #17  . Peg tube removal  03/2012    Family History  Problem Relation Age of Onset  . Alzheimer's disease Mother   .  Tics Mother   . Hypertension Father   . Cancer Father 62    Prostate  . Stroke Paternal Grandmother   . Cancer Paternal Grandfather     Lung  and Prostate    Social history:  reports that he quit smoking about 41 years ago. He has never used smokeless tobacco. He reports that he does not drink alcohol or use illicit drugs.  Medications:  Current Outpatient Prescriptions on File Prior to Visit  Medication Sig Dispense Refill  . aspirin EC 81 MG tablet Take 81 mg by mouth daily.      [name] dexlansoprazole (DEXILANT) 60 MG capsule Take 60 mg by mouth at bedtime.       . docusate sodium (COLACE) 100 MG capsule Take 100 mg by mouth 3 (three) times daily.      [name] MYRBETRIQ 50 MG TB24 tablet Take 50 mg by mouth daily.       . rosuvastatin (CRESTOR) 40 MG tablet Take 20 mg by mouth at bedtime.       . tamsulosin (FLOMAX) 0.4 MG CAPS capsule Take 0.4 mg by mouth daily.      [name] zolpidem (AMBIEN) 10 MG tablet Take 10 mg by mouth at bedtime.       [name] NASONEX 50 MCG/ACT nasal spray Place 2 sprays into the nose daily as needed.        No current facility-administered medications on file prior to visit.      Allergies  Allergen Reactions  . Flexeril [Cyclobenzaprine] Anxiety    Confusion    ROS:  Out of a complete 14 system review of symptoms, the patient complains only of the following symptoms, and all other reviewed systems are negative.  Ear discharge, hearing loss Dizziness, numbness of the hand Gait disorder  Blood pressure 122/85, pulse 62, weight 225 lb (102.059 kg).  Physical Exam  General: The patient is alert and cooperative at the time of the examination.  Eyes: Pupils are equal, round, and reactive to light. Discs are flat bilaterally.  Ears: Tympanic membrane on the right is clear, thickening and scarring is noted on the left tympanic membrane. Myringotomy tube is in place.  Neck: The neck is supple, no carotid bruits are noted.  Respiratory: The respiratory examination  is clear.  Cardiovascular: The cardiovascular examination reveals a regular rate and rhythm, no obvious murmurs or rubs are noted.  Skin: Extremities are without significant edema.  Neurologic Exam  Mental status: The patient is alert and oriented x 3 at the time of the examination. The patient has apparent normal recent and remote memory, with an apparently normal attention span and concentration ability.  Cranial nerves: Facial symmetry is not  present. Ptosis is noted on the left. There is good sensation of the face to pinprick and soft touch bilaterally. The strength of the facial muscles and the muscles to head turning and shoulder shrug are normal bilaterally. Speech is well enunciated, no aphasia or dysarthria is noted. Extraocular movements are full. Visual fields are full. The tongue is midline, and the patient has symmetric elevation of the soft palate. No obvious hearing deficits are noted.  Motor: The motor testing reveals 5 over 5 strength of all 4 extremities. Good symmetric motor tone is noted throughout.  Sensory: Sensory testing is intact to pinprick, soft touch, vibration sensation, and position sense on all 4 extremities, with exception of some stocking pattern pinprick sensory deficit two thirds the way up the legs bilaterally.. No evidence of extinction is noted.  Coordination: Cerebellar testing reveals good finger-nose-finger and heel-to-shin bilaterally. Nyan-Barrany procedure is clinically negative. The patient has persistent asymmetric nystagmus with a horizontal and rotatory component is more prominent when looking to the right than to the left.  Gait and station: Gait is normal. Tandem gait is slightly unsteady. Romberg is negative. No drift is seen.  Reflexes: Deep tendon reflexes are symmetric, but are depressed bilaterally. Toes are downgoing bilaterally.    MRI brain 09/13/13:  IMPRESSION:  1. Normal MRI appearance of the brain for age.  2. Left maxillary  sinusitis.  3. Left mastoid effusion more enhancement suggesting mastoiditis. No  obstructing nasopharyngeal lesion is evident.     Assessment/Plan:  1. Chronic dizziness  2. Squamous cell carcinoma, left neck  MRI evaluation of the brain shows evidence of what is probably radiation induced changes on the left side of the skull that include involvement of the left maxillary sinus and left mastoid air cell process. The patient appears to have scarring of the tympanic membrane on the left, and he likely has nerve injury related to either cisplatin or to radiation or to the combination of the above. The patient likely has a chronic issue with dizziness. Clinical examination does show a symmetric nystagmus with horizontal and rotatory component is more prominent when looking to the right than to the left. The patient will be sent for a brainstem auditory evoked response test, and he will followup in about 4 months. The patient is doing well with his pain in the right hand, and I suspect that the Lyrica dosing could be reduced. He will go down to 100 mg 3 times daily. If the pain level is not excessive, the Lyrica could potentially be tapered off.  Jill Alexanders MD 01/05/2014 1:01 PM  [facility] [address] [place], [place] [address]  Phone [phone] Fax [phone]

## 2014-02-04 ENCOUNTER — Other Ambulatory Visit (HOSPITAL_BASED_OUTPATIENT_CLINIC_OR_DEPARTMENT_OTHER): Payer: Managed Care, Other (non HMO)

## 2014-02-04 ENCOUNTER — Ambulatory Visit (HOSPITAL_BASED_OUTPATIENT_CLINIC_OR_DEPARTMENT_OTHER): Payer: Managed Care, Other (non HMO) | Admitting: Oncology

## 2014-02-04 ENCOUNTER — Other Ambulatory Visit: Payer: Self-pay | Admitting: *Deleted

## 2014-02-04 ENCOUNTER — Telehealth: Payer: Self-pay | Admitting: Oncology

## 2014-02-04 VITALS — BP 117/73 | HR 63 | Temp 97.9°F | Resp 18 | Ht 73.0 in | Wt 225.4 lb

## 2014-02-04 DIAGNOSIS — C770 Secondary and unspecified malignant neoplasm of lymph nodes of head, face and neck: Secondary | ICD-10-CM

## 2014-02-04 DIAGNOSIS — C109 Malignant neoplasm of oropharynx, unspecified: Secondary | ICD-10-CM

## 2014-02-04 DIAGNOSIS — C760 Malignant neoplasm of head, face and neck: Secondary | ICD-10-CM

## 2014-02-04 DIAGNOSIS — G609 Hereditary and idiopathic neuropathy, unspecified: Secondary | ICD-10-CM

## 2014-02-04 LAB — COMPREHENSIVE METABOLIC PANEL (CC13)
ALBUMIN: 3.9 g/dL (ref 3.5–5.0)
ALK PHOS: 60 U/L (ref 40–150)
ALT: 21 U/L (ref 0–55)
AST: 24 U/L (ref 5–34)
Anion Gap: 7 mEq/L (ref 3–11)
BUN: 10.8 mg/dL (ref 7.0–26.0)
CO2: 25 mEq/L (ref 22–29)
Calcium: 8.8 mg/dL (ref 8.4–10.4)
Chloride: 108 mEq/L (ref 98–109)
Creatinine: 1.1 mg/dL (ref 0.7–1.3)
GLUCOSE: 95 mg/dL (ref 70–140)
POTASSIUM: 4.1 meq/L (ref 3.5–5.1)
SODIUM: 139 meq/L (ref 136–145)
TOTAL PROTEIN: 6.5 g/dL (ref 6.4–8.3)
Total Bilirubin: 0.78 mg/dL (ref 0.20–1.20)

## 2014-02-04 LAB — CBC WITH DIFFERENTIAL/PLATELET
BASO%: 0.8 % (ref 0.0–2.0)
Basophils Absolute: 0 10*3/uL (ref 0.0–0.1)
EOS%: 2.4 % (ref 0.0–7.0)
Eosinophils Absolute: 0.1 10*3/uL (ref 0.0–0.5)
HCT: 39.7 % (ref 38.4–49.9)
HGB: 13.6 g/dL (ref 13.0–17.1)
LYMPH%: 25.7 % (ref 14.0–49.0)
MCH: 30.4 pg (ref 27.2–33.4)
MCHC: 34.3 g/dL (ref 32.0–36.0)
MCV: 88.6 fL (ref 79.3–98.0)
MONO#: 0.5 10*3/uL (ref 0.1–0.9)
MONO%: 12.4 % (ref 0.0–14.0)
NEUT%: 58.7 % (ref 39.0–75.0)
NEUTROS ABS: 2.2 10*3/uL (ref 1.5–6.5)
PLATELETS: 131 10*3/uL — AB (ref 140–400)
RBC: 4.48 10*6/uL (ref 4.20–5.82)
RDW: 12.7 % (ref 11.0–14.6)
WBC: 3.8 10*3/uL — ABNORMAL LOW (ref 4.0–10.3)
lymph#: 1 10*3/uL (ref 0.9–3.3)

## 2014-02-04 NOTE — Progress Notes (Signed)
ID: Steven Robinson   DOB: [birth year]  MR#: [id number]  KPT#:[id number]  PCP:  SU: Steven Skeans MD; Steven Montane MD OTHER MD: Steven Robinson, Steven Robinson, Steven Robinson, Steven Robinson, Steven Robinson   HISTORY OF HEAD AND NECK CANCER:: From the original intake note:   The patient noted an enlarging left neck mass early March 2013, and brought this to the attention of his PCP, Dr. Charleston Robinson. She obtained a neck CT at [facility] 08/20/2011, which showed multiple enlarged nodes in the left mid-neck along the jugular v., the largest 2.0 cm. She referred the patient to Steven Robinson in ENT and FNA of one of the enlarged nodes was positive for squamous cell carcinoma ([id number]). A p16 stain was read as equivocal. PET scan 09/03/2011 showed SUVs >6 in the left oropharynx and left jugular chain nodes; SUV of 5.49 was noted in the larynx. There was no evidence of contralateral or disseminated disease.  This was consistent with a clinical T1 N2,stage IVA left oropharyngeal SCCA. However multiple biopsies under Steven Robinson 09/09/2011 [[id number]] were all negative. The pathologic staging therefore is TX N2, which impacts radiation planning.  The patient met with Drs. Steven Robinson, and IMRT with concurrent chemosensitization was planned. The patient had dental evaluation under Dr. Enrique Robinson, several extractions under Dr. Benson Robinson, had audiometry (showing mild high-frequency hearing loss) and met with nutrition. A PEG tube placed by IR unfortunately became dislodged and infected, and had to be replaced through an open procedure under Steven Robinson. This delayed the start of chemotherapy and caused a change in plan from the original CDDP at 100 mg/m2 Q3w to 40 mg/M2 weekly. Radiation was started 10/04/2011 and chemotherapy 10/25/2011. The patient's subsequent history is as detailed below.   INTERVAL HISTORY: [name] returns today for followup of his head and neck cancer accompanied by his wife Steven Robinson. He has gained more  weight to the point that he is now beginning to wonder if he should diet. 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 His last visit here he also had a brain MRI and a chest x-ray neither of which showed evidence of metastatic disease. Since her last visit here he also had a screening exam by Steven Robinson, who placed a left my ring Steven Robinson me but did not detect any evidence of recurrent disease , and also Dr. Jannifer Robinson, who felt the changes seen on the brain MRI likely were secondary to radiation and specifically the left mastoid problem may be a cause of the patient's dizziness.  REVIEW OF SYSTEMS: [name] has no problem swallowing and is managing to keep this mouth moist by drinking a lot of water. His appetite is up and his taste is "pretty good". He doesn't tolerate spicy foods. There has been no change in bowel or bladder habits. He has had no mouth sores. He has increased his Lyrica 2 200 mg 3 times a day, and is tolerating that well. He still has is dizziness, but there have been no intercurrent falls. He continues to exercise by walking 2 miles a day most days. A detailed review of systems today was otherwise stable  PAST MEDICAL HISTORY: Past Medical History  Diagnosis Date  . Hyperlipoproteinemia   . Obstructive sleep apnea     on C-pap.   [name] GERD (gastroesophageal reflux disease)     chronic  . Hx of colonic polyps   . Herniated lumbar intervertebral disc  L4-5  . Vertigo     2/2 intersitial neuritis.   [name] Hearing loss of left ear   . BPH (benign prostatic hyperplasia)   . Hyperlipidemia   . Mucositis 11/01/2011  . S/P radiation therapy 10/04/11-11/22/11    Mucosal Axis/Neck:7000cGy/35Fractions  . Status post chemotherapy     CDDP During with Radiation Therapy  . Gastrostomy tube dependent     removed Oct. 2013  . 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 Neuromuscular disorder      Radiation Myelopathy  .  History of EKG     done at PCP office, T. Spears02-20-2012  . High cholesterol   . Xerostomia   . Pain     right arm and shoulder  . Ptosis of left eyelid   . Neuropathy     right upper extremity  . Right inguinal hernia   . Ptosis, left eyelid   . Fracture of fibula 7/14    left  . Dizziness and giddiness 01/04/2014    PAST SURGICAL HISTORY: Past Surgical History  Procedure Laterality Date  . Fine needle aspiration  08/26/11     LEFT NECK FNA, LYMPH NODE, METASTATIC SQUAMOUS CELL CARCINOMA  . Gastrostomy  09/24/2011    Procedure: GASTROSTOMY;  Surgeon: Steven Jarred, MD;  Location: [place];  Service: General;  Laterality: N/A;  open G-tube placement procedure start _0   . Rigid esophagoscopy  09/24/2011    Procedure: RIGID ESOPHAGOSCOPY;  Surgeon: Steven Montane, MD;  Location: [place];  Service: ENT;  Laterality: N/A;  Esophagoscopy procedure end _1   . Hernia repair  1990    Left  . Diagnostic laparoscopy      g-tube replacement   . Ventral hernia repair  04/24/2012    Procedure: LAPAROSCOPIC VENTRAL HERNIA;  Surgeon: Steven Jarred, MD;  Location: [place];  Service: General;  Laterality: N/A;  laparoscopic repair incisional hernia with mesh  . Inguinal hernia repair  04/24/2012    Procedure: HERNIA REPAIR INGUINAL ADULT;  Surgeon: Steven Jarred, MD;  Location: [place];  Service: General;  Laterality: Right;  . Insertion of mesh  04/24/2012    Procedure: INSERTION OF MESH;  Surgeon: Steven Jarred, MD;  Location: [place];  Service: General;  Laterality: Right;  . Removal bony sequestrum Left 09/29/12    Left mandible / Area #17  . Peg tube removal  03/2012    FAMILY HISTORY Family History  Problem Relation Age of Onset  . Alzheimer's disease Mother   . Tics Mother   . Hypertension Father   . Cancer Father 6    Prostate  . Stroke Paternal Grandmother   . Cancer Paternal Grandfather     Lung  and Prostate   the patient's father is alive at age 12. He lives in a retirement  community. The patient's mother died at the age of 61 in 08/03/09; she had significant Alzheimer's disease. The patient is an only child.  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 His wife Osvaldo Angst is a Artist in oil. The live on 8 acres and have 2 large horses, a donkey, a Clinical cytogeneticist, chickens, etc. The patient's daughter Caryl Pina lives in [place] and is currently looking for a job. The patient's son Yong Channel works for an Associate Professor. The patient has 2 grandchildren, granddaughter age 1 and a grandson aged 64 months. He attends the [employer]  ADVANCED DIRECTIVES: in  place  HEALTH MAINTENANCE: History  Substance Use Topics  . Smoking status: Former Smoker -- 0.25 packs/day for 3 years    Quit date: 06/14/1972  . Smokeless tobacco: Never Used  . Alcohol Use: No     Comment: Hx of Drinking wine - Stopped 3 years ago     Allergies  Allergen Reactions  . Flexeril [Cyclobenzaprine] Anxiety    Confusion    Current Outpatient Prescriptions  Medication Sig Dispense Refill  . Ascorbic Acid (VITAMIN C) 100 MG tablet Take 400 mg by mouth daily.      [name] aspirin EC 81 MG tablet Take 81 mg by mouth daily.      . B Complex Vitamins (VITAMIN B COMPLEX PO) Take 60 mg by mouth daily.      . cimetidine (TAGAMET) 800 MG tablet Take 800 mg by mouth daily.      . ciprofloxacin-dexamethasone (CIPRODEX) otic suspension Place 4 drops into the left ear 2 (two) times daily.      [name] dexlansoprazole (DEXILANT) 60 MG capsule Take 60 mg by mouth at bedtime.       . docusate sodium (COLACE) 100 MG capsule Take 100 mg by mouth 3 (three) times daily.      [name] MYRBETRIQ 50 MG TB24 tablet Take 50 mg by mouth daily.       [name] NASONEX 50 MCG/ACT nasal spray Place 2 sprays into the nose daily as needed.       . pregabalin (LYRICA) 100 MG capsule Take 1 capsule (100 mg total) by mouth 3 (three) times  daily.  90 capsule  2  . rosuvastatin (CRESTOR) 40 MG tablet Take 20 mg by mouth at bedtime.       . tamsulosin (FLOMAX) 0.4 MG CAPS capsule Take 0.4 mg by mouth daily.      [name] zolpidem (AMBIEN) 10 MG tablet Take 10 mg by mouth at bedtime.        No current facility-administered medications for this visit.    OBJECTIVE: Middle-aged white male who appears stated age 63 Vitals:   02/04/14 1407  BP: 117/73  Pulse: 63  Temp: 97.9 F (36.6 C)  Resp: 18     Body mass index is 29.74 kg/(m^2).    ECOG FS: 1 Filed Weights   02/04/14 1407  Weight: 225 lb 6.4 oz (102.241 kg)   Sclerae unicteric; pupils round and equal; EOMs intact Oropharynx clear, moderately moist, no thrush or other lesions No cervical or supraclavicular adenopathy Lungs clear to auscultation  Heart regular rate and rhythm, no murmur appreciated Abdomen  soft, positive bowel sounds, nontender MSK: No focal spinal tendernes; contraction fifth digit right hand, chronic Neuro: Nonfocal except for stable neuropathy the right upper extremity in the ulnar distribution; well-oriented; positive affect   LAB RESULTS:  Lab Results  Component Value Date   WBC 3.8* 02/04/2014   NEUTROABS 2.2 02/04/2014   HGB 13.6 02/04/2014   HCT 39.7 02/04/2014   MCV 88.6 02/04/2014   PLT 131* 02/04/2014      Chemistry      Component Value Date/Time   NA 142 09/13/2013 0854   NA 137 04/21/2012 1127   K 3.8 09/13/2013 0854   K 4.3 04/21/2012 1127   CL 104 10/03/2012 0803   CL 99 04/21/2012 1127   CO2 21* 09/13/2013 0854   CO2 30 04/21/2012 1127   BUN 10.2 09/13/2013 0854   BUN 17 04/21/2012 1127   CREATININE 0.8 09/13/2013 0854   CREATININE  0.73 04/21/2012 1127      Component Value Date/Time   CALCIUM 8.9 09/13/2013 0854   CALCIUM 9.5 04/21/2012 1127   ALKPHOS 68 09/13/2013 0854   ALKPHOS 62 12/13/2011 0950   AST 14 09/13/2013 0854   AST 18 12/13/2011 0950   ALT 13 09/13/2013 0854   ALT 13 12/13/2011 0950   BILITOT 0.50 09/13/2013 0854   BILITOT 0.4  12/13/2011 0950       STUDIES: No results found.  ASSESSMENT: 63 y.o. 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 diagnosed by FNA of a left cervical node March 2013, treated with IMRT completed 11/22/2011 and concurrent weekly cis-platinum, last dose 11/23/2011  (1) risk factors are smoking history (minimal and remote), significant alcohol use (discontinued 2010), and an equivocal p16 probe  (2) dizziness, possibly due to autonomic dysfunction from cis-platinum  (3) malnutrition: PEG removed October 2013, stopped Boost supplements February 2014; maintaining weight orally  (4) dry mouth: discontinued pilocarpine and rinses; drinks extra water during meals  (5) at risk of hypothyroidism: TSH normal April 2015 (2.8)  (6) right hand/arm/shoulder pain: 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; controlled on Lyrica, off narcotics as of March 2014  (7) right inguinal hernia/ventral hernia, repaired 04/24/2012  (8) bony sequestrum area #17, s/p debridement April 2014  (9) mild left ptosis, chronic  (10) status post trauma to the left lower extremity involving laceration and fracture of the fibula, July 2014, resolved   PLAN:   Amron is doing terrific with no evidence of disease recurrence. He is going to BE following with Steven Robinson in November, so he will see me again in February. At that visit we will probably set him up for a chest x-ray, but in the absence of symptoms we will probably not obtain restaging scans (CT of the neck and chest or PET) until next spring.  His weight is fine as far as I am concerned although I do think he probably is at the top of where he needs to be. He has a good exercise program. Unfortunately I don't know how to get rid of his peripheral neuropathy. I am refilling his Lyrica.  If he does get his new dog train, we will be delighted to have them here for patient's therapy.  Wallace knows to call for any problems  that may develop before the next visit here. Incidentally his wife Shauna Hugh will soon be seeing me for her 10th anniversary of breast cancer surgery and likely will be "graduating" from followup at that visit [place] [facility]    02/04/2014

## 2014-03-04 ENCOUNTER — Other Ambulatory Visit: Payer: Self-pay | Admitting: Dermatology

## 2014-04-25 NOTE — Progress Notes (Addendum)
Radiation Oncology         [phone]) [phone] ________________________________  Name: Steven Robinson MRN: [id number]  Date: 04/26/2014  DOB: July 24, 1950  Follow-Up Visit Note  CC: Florina Ou, MD  Melissa Montane, MD  Diagnosis and Prior Radiotherapy:  [id number] Squamous cell carcinoma, head and neck, unknown primary . Presented with left neck nodes.   Interval Since Last Radiation: Completed 70 Gy/35 fractions on 11-22-11 with concurrent cis-platinum  Narrative:  The patient returns today for routine follow-up.  He is doing well.  No new lumps or bumps in neck. No new swallowing issues or sore throat. Main complaints - some decreased hearing on left side.  Some continued balance issues.  These issues were somewhat responsive to  tympanostomy tube (effusion noted in left ear by ENT).   He also has stable neck spasms.   Continued clumsiness on 4th and 5th right digits. He is on Lyrica, but note sure it helps.  Would like to halve dose and see how he does on that.  ALLERGIES:  is allergic to flexeril.  Meds: Current Outpatient Prescriptions  Medication Sig Dispense Refill  . aspirin EC 81 MG tablet Take 81 mg by mouth daily.    [name] dexlansoprazole (DEXILANT) 60 MG capsule Take 60 mg by mouth at bedtime.     . docusate sodium (COLACE) 100 MG capsule Take 100 mg by mouth 3 (three) times daily.    [name] MYRBETRIQ 50 MG TB24 tablet Take 50 mg by mouth daily.     [name] NASONEX 50 MCG/ACT nasal spray Place 2 sprays into the nose daily as needed.     . pregabalin (LYRICA) 100 MG capsule Take 1 capsule (100 mg total) by mouth 3 (three) times daily. Take 2 capsules TID if needed. 90 capsule 2  . rosuvastatin (CRESTOR) 40 MG tablet Take 20 mg by mouth at bedtime.     . tamsulosin (FLOMAX) 0.4 MG CAPS capsule Take 0.4 mg by mouth daily.    [name] zolpidem (AMBIEN) 10 MG tablet Take 10 mg by mouth at bedtime.      No current facility-administered medications for this encounter.    Physical Findings: The patient is in no  acute distress. Patient is alert and oriented.  height is 6\' 1"  (1.854 m) and weight is 226 lb 11.2 oz (102.83 kg). His temperature is 98 F (36.7 C). His blood pressure is 108/62 and his pulse is 63. .  Well appearing. No oropharyngeal  lesions or thrush. No neck adenopathy in cervical or supraclavicular regions.  Lab Findings: Lab Results  Component Value Date   WBC 3.8* 02/04/2014   HGB 13.6 02/04/2014   HCT 39.7 02/04/2014   MCV 88.6 02/04/2014   PLT 131* 02/04/2014    Lab Results  Component Value Date   TSH 2.805 09/13/2013    Radiographic Findings: No results found.  Impression/Plan:    1) Head and Neck Cancer Status:  NED  2) Nutritional Status: no issues - PEG tube: removed  3) Risk Factors: The patient has been educated about risk factors including alcohol and tobacco abuse; they understand that avoidance of alcohol and tobacco is important to prevent recurrences as well as other cancers  4) Swallowing: No issues  5) Dental: He has healed from prior bone sequestion. Continues fluoride rinses, regular checkups  6) Energy:  Good with normal TSH in April.  Check TSH in Feb before med/onc appt.  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   10) Neurologic issues with working diagnosis of radiation myelopathy - satisfactory status, but residual symptoms in right hand. Still on Lyrica, no narcotics. Halve Lyrica today per patient request. We'll see if this works just as well for him.  11) Other: Gayleen Orem, RN, our Head and Neck Oncology Navigator will make sure patient has ENT appt for surveillance in ~ 77mo.  12) Follow-up in 12 months. The patient was encouraged to call with any issues or questions before then. ENT f/u to continue (and Hem Onc f/u in feb.)  Over 25 minutes spent face to face with patient; >50% on counseling and care  coordination. _____________________________________   Eppie Gibson, MD

## 2014-04-26 ENCOUNTER — Ambulatory Visit
Admission: RE | Admit: 2014-04-26 | Discharge: 2014-04-26 | Disposition: A | Discharge status: Home / Self Care | Payer: Managed Care, Other (non HMO) | Source: Ambulatory Visit | Attending: Radiation Oncology | Admitting: Radiation Oncology

## 2014-04-26 ENCOUNTER — Encounter: Payer: Self-pay | Admitting: Radiation Oncology

## 2014-04-26 VITALS — BP 108/62 | HR 63 | Temp 98.0°F | Ht 73.0 in | Wt 226.7 lb

## 2014-04-26 DIAGNOSIS — C760 Malignant neoplasm of head, face and neck: Secondary | ICD-10-CM

## 2014-04-26 DIAGNOSIS — C770 Secondary and unspecified malignant neoplasm of lymph nodes of head, face and neck: Secondary | ICD-10-CM

## 2014-04-26 MED ORDER — PREGABALIN 100 MG PO CAPS: 100.0000 mg | ORAL_CAPSULE | Freq: Three times a day (TID) | ORAL | Status: DC

## 2014-04-29 ENCOUNTER — Encounter: Payer: Self-pay | Admitting: Radiation Oncology

## 2014-04-29 ENCOUNTER — Telehealth: Payer: Self-pay | Admitting: *Deleted

## 2014-04-29 DIAGNOSIS — C770 Secondary and unspecified malignant neoplasm of lymph nodes of head, face and neck: Secondary | ICD-10-CM | POA: Insufficient documentation

## 2014-04-29 NOTE — Telephone Encounter (Signed)
Spoke with Jokebed at [facility] ENT, requested 6 mo f/u for pt with Dr. Janace Hoard.  She verbalized understanding, indicated she will call him to arrange.  Gayleen Orem, RN, BSN, [place] at [place] [phone]

## 2014-04-30 ENCOUNTER — Telehealth: Payer: Self-pay | Admitting: Radiation Oncology

## 2014-04-30 NOTE — Telephone Encounter (Signed)
Confirmed with Steven Robinson to have his labwork done before med/onc appt on 08/05/14. He has agreed to come in at 3pm that day for TSH.

## 2014-05-01 ENCOUNTER — Encounter: Payer: Self-pay | Admitting: Neurology

## 2014-05-07 ENCOUNTER — Encounter: Payer: Self-pay | Admitting: Neurology

## 2014-05-07 ENCOUNTER — Ambulatory Visit (INDEPENDENT_AMBULATORY_CARE_PROVIDER_SITE_OTHER): Payer: Managed Care, Other (non HMO) | Admitting: Neurology

## 2014-05-07 VITALS — BP 125/83 | HR 67 | Ht 73.0 in | Wt 229.6 lb

## 2014-05-07 DIAGNOSIS — R42 Dizziness and giddiness: Secondary | ICD-10-CM

## 2014-05-07 NOTE — Progress Notes (Signed)
Reason for visit: dizziness  Steven Robinson is an 63 y.o. male  History of present illness:  Steven Robinson is a 63 year old right-handed white gentleman with a history of squamous cell carcinoma on the left neck. The patient has had radiation therapy to this area. He has developed some dizziness, but over time this has improved. He has also had some numbness in the ulnar aspect of the right hand, occasionally on the left, with occasional spasms of the hand. Overall, he has done fairly well over time. The patient has undergone a brainstem auditory evoked response test that shows some slowing on the left centrally. He feels as if his balance is somewhat off, but he has not had any falls, and he is functioning fairly well, with minimal limitations in his day-to-day living. He returns for an evaluation.  Past Medical History  Diagnosis Date  . Hyperlipoproteinemia   . Obstructive sleep apnea     on C-pap.   [name] GERD (gastroesophageal reflux disease)     chronic  . Hx of colonic polyps   . 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 Vertigo     2/2 intersitial neuritis.   [name] Hearing loss of left ear   . BPH (benign prostatic hyperplasia)   . Hyperlipidemia   . Mucositis 11/01/2011  . S/P radiation therapy 10/04/11-11/22/11    Mucosal Axis/Neck:7000cGy/35Fractions  . Status post chemotherapy     CDDP During with Radiation Therapy  . Gastrostomy tube dependent     removed Oct. 2013  . 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 Neuromuscular disorder      Radiation Myelopathy  . History of EKG     done at PCP office, T. Spears- 2012  . High cholesterol   . Xerostomia   . Pain     right arm and shoulder  . Ptosis of left eyelid   . Neuropathy     right upper extremity  . Right inguinal hernia   . Ptosis, left eyelid   . Fracture of fibula 7/14    left  . Dizziness and giddiness 01/04/2014    Past Surgical History  Procedure Laterality Date  . Fine needle aspiration  08/26/11     LEFT  NECK FNA, LYMPH NODE, METASTATIC SQUAMOUS CELL CARCINOMA  . Gastrostomy  09/24/2011    Procedure: GASTROSTOMY;  Surgeon: Zenovia Jarred, MD;  Location: [place];  Service: General;  Laterality: N/A;  open G-tube placement procedure start @1219   . Rigid esophagoscopy  09/24/2011    Procedure: RIGID ESOPHAGOSCOPY;  Surgeon: Melissa Montane, MD;  Location: [place];  Service: ENT;  Laterality: N/A;  Esophagoscopy procedure end @1210   . Hernia repair  1990    Left  . Diagnostic laparoscopy      g-tube replacement   . Ventral hernia repair  04/24/2012    Procedure: LAPAROSCOPIC VENTRAL HERNIA;  Surgeon: Zenovia Jarred, MD;  Location: [place];  Service: General;  Laterality: N/A;  laparoscopic repair incisional hernia with mesh  . Inguinal hernia repair  04/24/2012    Procedure: HERNIA REPAIR INGUINAL ADULT;  Surgeon: Zenovia Jarred, MD;  Location: [place];  Service: General;  Laterality: Right;  . Insertion of mesh  04/24/2012    Procedure: INSERTION OF MESH;  Surgeon: Zenovia Jarred, MD;  Location: [place];  Service: General;  Laterality: Right;  . Removal bony sequestrum Left 09/29/12    Left mandible / Area #17  . Peg tube removal  03/2012    Family History  Problem Relation Age of Onset  . Alzheimer's disease Mother   . Tics Mother   . Hypertension Father   . Cancer Father 66    Prostate  . Stroke Paternal Grandmother   . Cancer Paternal Grandfather     Lung  and Prostate    Social history:  reports that he quit smoking about 41 years ago. He has never used smokeless tobacco. He reports that he does not drink alcohol or use illicit drugs.    Allergies  Allergen Reactions  . Flexeril [Cyclobenzaprine] Anxiety    Confusion    Medications:  Current Outpatient Prescriptions on File Prior to Visit  Medication Sig Dispense Refill  . aspirin EC 81 MG tablet Take 81 mg by mouth daily.    [name] dexlansoprazole (DEXILANT) 60 MG capsule Take 60 mg by mouth at bedtime.     . docusate sodium (COLACE)  100 MG capsule Take 100 mg by mouth 3 (three) times daily.    [name] MYRBETRIQ 50 MG TB24 tablet Take 50 mg by mouth daily.     [name] NASONEX 50 MCG/ACT nasal spray Place 2 sprays into the nose daily as needed.     . pregabalin (LYRICA) 100 MG capsule Take 1 capsule (100 mg total) by mouth 3 (three) times daily. Take 2 capsules TID if needed. 90 capsule 2  . tamsulosin (FLOMAX) 0.4 MG CAPS capsule Take 0.4 mg by mouth daily.    [name] zolpidem (AMBIEN) 10 MG tablet Take 10 mg by mouth at bedtime.      No current facility-administered medications on file prior to visit.    ROS:  Out of a complete 14 system review of symptoms, the patient complains only of the following symptoms, and all other reviewed systems are negative.  Weight change Numbness of right hand  Blood pressure 125/83, pulse 67, height 6\' 1"  (1.854 m), weight 229 lb 9.6 oz (104.146 kg).  Physical Exam  General: The patient is alert and cooperative at the time of the examination. The patient is minimally obese.  Skin: No significant peripheral edema is noted.   Neurologic Exam  Mental status: The patient is oriented x 3.  Cranial nerves: Facial symmetry is present. Speech is normal, no aphasia or dysarthria is noted. Extraocular movements are full. Visual fields are full.  Motor: The patient has good strength in all 4 extremities.  Sensory examination: Soft touch sensation is symmetric on the face, arms, and legs.  Coordination: The patient has good finger-nose-finger and heel-to-shin bilaterally.  Gait and station: The patient has a normal gait. Tandem gait is normal. Romberg is negative. No drift is seen.  Reflexes: Deep tendon reflexes are symmetric.   Assessment/Plan:  1. Squamous cell carcinoma, left  2. History dizziness, improving  Overall, the patient is doing quite well. We will follow the patient conservatively at this point, he will follow-up if needed. The patient will try to taper off the Lyrica slowly  over the next several weeks.  Jill Alexanders MD 05/07/2014 6:30 PM  [facility] [address] [place], [place] [address]  Phone [phone] Fax [phone]

## 2014-05-07 NOTE — Patient Instructions (Signed)

## 2014-05-31 ENCOUNTER — Other Ambulatory Visit: Payer: Self-pay | Admitting: Oncology

## 2014-07-10 ENCOUNTER — Telehealth: Payer: Self-pay | Admitting: Oncology

## 2014-07-10 NOTE — Telephone Encounter (Signed)
Mailed pts medical records to [facility].

## 2014-07-15 ENCOUNTER — Telehealth: Payer: Self-pay | Admitting: Oncology

## 2014-07-15 NOTE — Telephone Encounter (Signed)
pt cld to r/s appt-gave pt r/s time & date °

## 2014-08-05 ENCOUNTER — Ambulatory Visit: Payer: Self-pay | Admitting: Oncology

## 2014-08-05 ENCOUNTER — Other Ambulatory Visit: Payer: Self-pay

## 2014-08-05 ENCOUNTER — Ambulatory Visit: Payer: Managed Care, Other (non HMO) | Attending: Family Medicine

## 2014-08-20 ENCOUNTER — Telehealth: Payer: Self-pay | Admitting: Oncology

## 2014-08-20 ENCOUNTER — Other Ambulatory Visit (HOSPITAL_BASED_OUTPATIENT_CLINIC_OR_DEPARTMENT_OTHER): Payer: Managed Care, Other (non HMO)

## 2014-08-20 ENCOUNTER — Ambulatory Visit (HOSPITAL_BASED_OUTPATIENT_CLINIC_OR_DEPARTMENT_OTHER): Payer: Managed Care, Other (non HMO) | Admitting: Oncology

## 2014-08-20 VITALS — BP 144/77 | HR 67 | Temp 97.7°F | Resp 18 | Ht 73.0 in | Wt 217.2 lb

## 2014-08-20 DIAGNOSIS — C770 Secondary and unspecified malignant neoplasm of lymph nodes of head, face and neck: Secondary | ICD-10-CM

## 2014-08-20 DIAGNOSIS — C109 Malignant neoplasm of oropharynx, unspecified: Secondary | ICD-10-CM

## 2014-08-20 DIAGNOSIS — C760 Malignant neoplasm of head, face and neck: Secondary | ICD-10-CM

## 2014-08-20 DIAGNOSIS — G629 Polyneuropathy, unspecified: Secondary | ICD-10-CM

## 2014-08-20 DIAGNOSIS — E46 Unspecified protein-calorie malnutrition: Secondary | ICD-10-CM

## 2014-08-20 LAB — COMPREHENSIVE METABOLIC PANEL (CC13)
ALT: 10 U/L (ref 0–55)
AST: 15 U/L (ref 5–34)
Albumin: 4 g/dL (ref 3.5–5.0)
Alkaline Phosphatase: 59 U/L (ref 40–150)
Anion Gap: 9 mEq/L (ref 3–11)
BILIRUBIN TOTAL: 0.92 mg/dL (ref 0.20–1.20)
BUN: 10.2 mg/dL (ref 7.0–26.0)
CO2: 27 mEq/L (ref 22–29)
Calcium: 8.9 mg/dL (ref 8.4–10.4)
Chloride: 105 mEq/L (ref 98–109)
Creatinine: 0.8 mg/dL (ref 0.7–1.3)
EGFR: >90 mL/min/{1.73_m2} (ref >90)
Glucose: 111 mg/dl (ref 70–140)
POTASSIUM: 3.9 meq/L (ref 3.5–5.1)
SODIUM: 141 meq/L (ref 136–145)
Total Protein: 6.2 g/dL — ABNORMAL LOW (ref 6.4–8.3)

## 2014-08-20 LAB — CBC WITH DIFFERENTIAL/PLATELET
BASO%: 0.6 % (ref 0.0–2.0)
BASOS ABS: 0 10*3/uL (ref 0.0–0.1)
EOS%: 1.3 % (ref 0.0–7.0)
Eosinophils Absolute: 0.1 10*3/uL (ref 0.0–0.5)
HEMATOCRIT: 41.5 % (ref 38.4–49.9)
HEMOGLOBIN: 14.3 g/dL (ref 13.0–17.1)
LYMPH#: 0.9 10*3/uL (ref 0.9–3.3)
LYMPH%: 19 % (ref 14.0–49.0)
MCH: 30.4 pg (ref 27.2–33.4)
MCHC: 34.5 g/dL (ref 32.0–36.0)
MCV: 88.1 fL (ref 79.3–98.0)
MONO#: 0.5 10*3/uL (ref 0.1–0.9)
MONO%: 10.7 % (ref 0.0–14.0)
NEUT%: 68.4 % (ref 39.0–75.0)
NEUTROS ABS: 3.2 10*3/uL (ref 1.5–6.5)
Platelets: 141 10*3/uL (ref 140–400)
RBC: 4.71 10*6/uL (ref 4.20–5.82)
RDW: 12.8 % (ref 11.0–14.6)
WBC: 4.7 10*3/uL (ref 4.0–10.3)

## 2014-08-20 NOTE — Progress Notes (Signed)
ID: Criselda Peaches   DOB: 11-Dec-1950  MR#: [id number]  TIW#:[id number]  PCP:  SU: Georganna Skeans MD; Melissa Montane MD OTHER MD: Eppie Gibson, Marcial Pacas, Jovita Gamma, Doreene Nest, Corky Sox Stoneking   HISTORY OF HEAD AND NECK CANCER:: From the original intake note:   The patient noted an enlarging left neck mass early March 2013, and brought this to the attention of his PCP, Dr. Charleston Poot. She obtained a neck CT at [facility] 08/20/2011, which showed multiple enlarged nodes in the left mid-neck along the jugular v., the largest 2.0 cm. She referred the patient to Melissa Montane in ENT and FNA of one of the enlarged nodes was positive for squamous cell carcinoma ([id number]). A p16 stain was read as equivocal. PET scan 09/03/2011 showed SUVs >6 in the left oropharynx and left jugular chain nodes; SUV of 5.49 was noted in the larynx. There was no evidence of contralateral or disseminated disease.  This was consistent with a clinical T1 N2,stage IVA left oropharyngeal SCCA. However multiple biopsies under Dr. Janace Hoard 09/09/2011 [[id number]] were all negative. The pathologic staging therefore is TX N2, which impacts radiation planning.  The patient met with Drs. Haskell Riling, and IMRT with concurrent chemosensitization was planned. The patient had dental evaluation under Dr. Enrique Sack, several extractions under Dr. Benson Norway, had audiometry (showing mild high-frequency hearing loss) and met with nutrition. A PEG tube placed by IR unfortunately became dislodged and infected, and had to be replaced through an open procedure under Georganna Skeans. This delayed the start of chemotherapy and caused a change in plan from the original CDDP at 100 mg/m2 Q3w to 40 mg/M2 weekly. Radiation was started 10/04/2011 and chemotherapy 10/25/2011. The patient's subsequent history is as detailed below.   INTERVAL HISTORY: Wataru returns today for followup of his head and neck cancer accompanied by his wife Diann (who incidentally  just had right knee replacement and is feeling "like new".Lanny Hurst is making sure his weight stays above 200 pounds. He has a good appetite and makes sure to have plenty of liquid with anything he is. He swallows fine. His teeth are okay except for a spot in the right posterior mandible, which is a little sore. He flosses twice a day and rinses frequency. He continues to significant neuropathy particularly involving the ulnar distribution of the right hand, practically not at all the left hand or feet. Sometimes in the morning when he wakes up he has to sit for a while to prevent orthostasis. He understands that is due to the cisplatin he received. Rarely he has a mild headache, usually in the evening, usually his supraorbital on the right side. More recently when lifting heavy suitcases to an overhead compartment (he does a lot of traveling for his job) he had a sharp pain in his neck. He knows he has degenerative disease there.   REVIEW OF SYSTEMS: A detailed review of systems today is negative except as noted above  PAST MEDICAL HISTORY: Past Medical History  Diagnosis Date  . Hyperlipoproteinemia   . Obstructive sleep apnea     on C-pap.   [name] GERD (gastroesophageal reflux disease)     chronic  . Hx of colonic polyps   . 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 Vertigo     2/2 intersitial neuritis.   [name] Hearing loss of left ear   . BPH (benign prostatic hyperplasia)   . Hyperlipidemia   . Mucositis 11/01/2011  . S/P radiation  therapy 10/04/11-11/22/11    Mucosal Axis/Neck:7000cGy/35Fractions  . Status post chemotherapy     CDDP During with Radiation Therapy  . Gastrostomy tube dependent     removed Oct. 2013  . 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 Neuromuscular disorder      Radiation Myelopathy  . History of EKG     done at PCP office, T. Spears19-Mar-2012  . High cholesterol   . Xerostomia   . Pain     right arm and shoulder  . Ptosis of left eyelid   . Neuropathy     right upper  extremity  . Right inguinal hernia   . Ptosis, left eyelid   . Fracture of fibula 7/14    left  . Dizziness and giddiness 01/04/2014    PAST SURGICAL HISTORY: Past Surgical History  Procedure Laterality Date  . Fine needle aspiration  08/26/11     LEFT NECK FNA, LYMPH NODE, METASTATIC SQUAMOUS CELL CARCINOMA  . Gastrostomy  09/24/2011    Procedure: GASTROSTOMY;  Surgeon: Zenovia Jarred, MD;  Location: [place];  Service: General;  Laterality: N/A;  open G-tube placement procedure start $RemoveBeforeD'@1219'[id number]$   . Rigid esophagoscopy  09/24/2011    Procedure: RIGID ESOPHAGOSCOPY;  Surgeon: Melissa Montane, MD;  Location: [place];  Service: ENT;  Laterality: N/A;  Esophagoscopy procedure end $RemoveBefor'@1210'[id number]$   . Hernia repair  1990    Left  . Diagnostic laparoscopy      g-tube replacement   . Ventral hernia repair  04/24/2012    Procedure: LAPAROSCOPIC VENTRAL HERNIA;  Surgeon: Zenovia Jarred, MD;  Location: [place];  Service: General;  Laterality: N/A;  laparoscopic repair incisional hernia with mesh  . Inguinal hernia repair  04/24/2012    Procedure: HERNIA REPAIR INGUINAL ADULT;  Surgeon: Zenovia Jarred, MD;  Location: [place];  Service: General;  Laterality: Right;  . Insertion of mesh  04/24/2012    Procedure: INSERTION OF MESH;  Surgeon: Zenovia Jarred, MD;  Location: [place];  Service: General;  Laterality: Right;  . Removal bony sequestrum Left 09/29/12    Left mandible / Area #17  . Peg tube removal  03/2012    FAMILY HISTORY Family History  Problem Relation Age of Onset  . Alzheimer's disease Mother   . Tics Mother   . Hypertension Father   . Cancer Father 7    Prostate  . Stroke Paternal Grandmother   . Cancer Paternal Grandfather     Lung  and Prostate   the patient's father is alive at age 70. He lives in a retirement community. The patient's mother died at the age of 78 in 08-30-2009; she had significant Alzheimer's disease. The patient is an only child.  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 His wife Osvaldo Angst is a Artist in oil. The live on 8 acres and have 2 large horses, a donkey, a Clinical cytogeneticist, chickens, etc. The patient's daughter Caryl Pina lives in [place] and is currently looking for a job. The patient's son Yong Channel works for an Associate Professor. The patient has 2 grandchildren, granddaughter age 54 and a grandson aged 19 months. He attends the [employer]  ADVANCED DIRECTIVES: in place  HEALTH MAINTENANCE: History  Substance Use Topics  . Smoking status: Former Smoker -- 0.25 packs/day for 3 years    Quit date: 06/14/1972  . Smokeless tobacco: Never Used  . Alcohol Use: No  Comment: Hx of Drinking wine - Stopped 3 years ago     Allergies  Allergen Reactions  . Flexeril [Cyclobenzaprine] Anxiety    Confusion    Current Outpatient Prescriptions  Medication Sig Dispense Refill  . aspirin EC 81 MG tablet Take 81 mg by mouth daily.    [name] dexlansoprazole (DEXILANT) 60 MG capsule Take 60 mg by mouth at bedtime.     . docusate sodium (COLACE) 100 MG capsule Take 100 mg by mouth 3 (three) times daily.    [name] MYRBETRIQ 50 MG TB24 tablet Take 50 mg by mouth daily.     [name] NASONEX 50 MCG/ACT nasal spray Place 2 sprays into the nose daily as needed.     . pregabalin (LYRICA) 100 MG capsule Take 1 capsule (100 mg total) by mouth 3 (three) times daily. Take 2 capsules TID if needed. 90 capsule 2  . rosuvastatin (CRESTOR) 20 MG tablet Take 20 mg by mouth daily.    . tamsulosin (FLOMAX) 0.4 MG CAPS capsule Take 0.4 mg by mouth daily.    [name] zolpidem (AMBIEN) 10 MG tablet Take 10 mg by mouth at bedtime.      No current facility-administered medications for this visit.    OBJECTIVE: Middle-aged white man in no acute distress  Filed Vitals:   08/20/14 1428  BP: 144/77  Pulse: 67  Temp: 97.7 F (36.5 C)  Resp: 18     Body mass index is 28.66 kg/(m^2).    ECOG FS:  1 Filed Weights   08/20/14 1428  Weight: 217 lb 3.2 oz (98.521 kg)   Sclerae unicteric;pupils round and reactive; there is a little area in the inner irons laterally on the left which is slightly darker than the rest of the iris, but not black Oropharynx clear, slightly dry, teeth in good repair No cervical or supraclavicular adenopathy, good cervical range of motion Lungs clear to auscultation  Heart regular rate and rhythm, no murmur appreciated Abdomen  soft, positive bowel sounds, nontender MSK: No focal spinal tendernes; contraction fifth digit right handas previously noted Neuro: Nonfocal except for stable neuropathy the right upper extremity in the ulnar distribution; well-oriented; positive affect   LAB RESULTS:  Lab Results  Component Value Date   WBC 4.7 08/20/2014   NEUTROABS 3.2 08/20/2014   HGB 14.3 08/20/2014   HCT 41.5 08/20/2014   MCV 88.1 08/20/2014   PLT 141 08/20/2014      Chemistry      Component Value Date/Time   NA 139 02/04/2014 1349   NA 137 04/21/2012 1127   K 4.1 02/04/2014 1349   K 4.3 04/21/2012 1127   CL 104 10/03/2012 0803   CL 99 04/21/2012 1127   CO2 25 02/04/2014 1349   CO2 30 04/21/2012 1127   BUN 10.8 02/04/2014 1349   BUN 17 04/21/2012 1127   CREATININE 1.1 02/04/2014 1349   CREATININE 0.73 04/21/2012 1127      Component Value Date/Time   CALCIUM 8.8 02/04/2014 1349   CALCIUM 9.5 04/21/2012 1127   ALKPHOS 60 02/04/2014 1349   ALKPHOS 62 12/13/2011 0950   AST 24 02/04/2014 1349   AST 18 12/13/2011 0950   ALT 21 02/04/2014 1349   ALT 13 12/13/2011 0950   BILITOT 0.78 02/04/2014 1349   BILITOT 0.4 12/13/2011 0950       STUDIES: No results found.   ASSESSMENT: 64 y.o. 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 diagnosed by FNA  of a left cervical node March 2013, treated with IMRT completed 11/22/2011 and concurrent weekly cis-platinum, last dose 11/23/2011  (1) risk factors are  smoking history (minimal and remote), significant alcohol use (discontinued 2010), and an equivocal p16 probe  (2) dizziness, possibly due to autonomic dysfunction from cis-platinum  (3) malnutrition: PEG removed October 2013, stopped Boost supplements February 2014; maintaining weight orally  (4) dry mouth: discontinued pilocarpine and rinses; drinks extra water during meals  (5) at risk of hypothyroidism: TSH normal April 2015 (2.8)  (6) right hand/arm/shoulder pain: 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 He has a variety of chronic issues, including dry mouth, problems with his teeth, peripheral neuropathy particularly involving the right hand, and mild orthostasis, which do not indicate disease progression but rather the sequela of the very aggressive treatment he received.  He is going to see me again in 6 months. We will obtain lab work a week before that and that will include a TSH. We will do a chest x-ray prior to that visit but in the absence of other specific symptoms I am not planning to repeat scans.  I have advised him to avoid heavy weights. I don't think his headaches are related to his prior treatment or disease, they sound more like mild cluster headaches and they do 10 to occur in the evening, with some stress component. As far as a peripheral neuropathy is concerned it is remarkable that he has weaned himself entirely from analgesics and now from Lyrica as well as. He may benefit from acupuncture and  I suggested that.  Incidentally his dog is now trained pet therapy dog and I am hoping he will be able to bring her here.  Kimmie knows to call for any problems that may develop before his next visit here [place]    08/20/2014

## 2014-08-20 NOTE — Telephone Encounter (Signed)
appts made and avs printed for pt °

## 2014-08-22 NOTE — Addendum Note (Signed)
Addended by: Laureen Abrahams on: 08/22/2014 03:04 PM   Modules accepted: Orders, Medications

## 2015-02-24 ENCOUNTER — Other Ambulatory Visit (HOSPITAL_BASED_OUTPATIENT_CLINIC_OR_DEPARTMENT_OTHER): Payer: Managed Care, Other (non HMO)

## 2015-02-24 ENCOUNTER — Other Ambulatory Visit: Payer: Self-pay | Admitting: *Deleted

## 2015-02-24 ENCOUNTER — Other Ambulatory Visit (HOSPITAL_BASED_OUTPATIENT_CLINIC_OR_DEPARTMENT_OTHER): Payer: Managed Care, Other (non HMO) | Admitting: *Deleted

## 2015-02-24 DIAGNOSIS — C109 Malignant neoplasm of oropharynx, unspecified: Secondary | ICD-10-CM | POA: Diagnosis not present

## 2015-02-24 DIAGNOSIS — C770 Secondary and unspecified malignant neoplasm of lymph nodes of head, face and neck: Secondary | ICD-10-CM | POA: Diagnosis not present

## 2015-02-24 DIAGNOSIS — C760 Malignant neoplasm of head, face and neck: Secondary | ICD-10-CM

## 2015-02-24 DIAGNOSIS — Z08 Encounter for follow-up examination after completed treatment for malignant neoplasm: Secondary | ICD-10-CM

## 2015-02-24 LAB — COMPREHENSIVE METABOLIC PANEL (CC13)
ALT: 17 U/L (ref 0–55)
ANION GAP: 7 meq/L (ref 3–11)
AST: 20 U/L (ref 5–34)
Albumin: 4.2 g/dL (ref 3.5–5.0)
Alkaline Phosphatase: 59 U/L (ref 40–150)
BILIRUBIN TOTAL: 0.62 mg/dL (ref 0.20–1.20)
BUN: 9.6 mg/dL (ref 7.0–26.0)
CALCIUM: 9.4 mg/dL (ref 8.4–10.4)
CO2: 26 meq/L (ref 22–29)
Chloride: 108 mEq/L (ref 98–109)
Creatinine: 0.8 mg/dL (ref 0.7–1.3)
Glucose: 93 mg/dl (ref 70–140)
Potassium: 4.1 mEq/L (ref 3.5–5.1)
Sodium: 142 mEq/L (ref 136–145)
TOTAL PROTEIN: 6.5 g/dL (ref 6.4–8.3)

## 2015-02-24 LAB — CBC WITH DIFFERENTIAL/PLATELET
BASO%: 0.3 % (ref 0.0–2.0)
Basophils Absolute: 0 10*3/uL (ref 0.0–0.1)
EOS ABS: 0 10*3/uL (ref 0.0–0.5)
EOS%: 0.5 % (ref 0.0–7.0)
HCT: 43.6 % (ref 38.4–49.9)
HGB: 14.8 g/dL (ref 13.0–17.1)
LYMPH%: 13.9 % — ABNORMAL LOW (ref 14.0–49.0)
MCH: 30.3 pg (ref 27.2–33.4)
MCHC: 34 g/dL (ref 32.0–36.0)
MCV: 89.1 fL (ref 79.3–98.0)
MONO#: 0.5 10*3/uL (ref 0.1–0.9)
MONO%: 8.8 % (ref 0.0–14.0)
NEUT%: 76.5 % — ABNORMAL HIGH (ref 39.0–75.0)
NEUTROS ABS: 4.2 10*3/uL (ref 1.5–6.5)
PLATELETS: 164 10*3/uL (ref 140–400)
RBC: 4.9 10*6/uL (ref 4.20–5.82)
RDW: 13 % (ref 11.0–14.6)
WBC: 5.5 10*3/uL (ref 4.0–10.3)
lymph#: 0.8 10*3/uL — ABNORMAL LOW (ref 0.9–3.3)

## 2015-02-24 LAB — TSH CHCC: TSH: 1.671 m(IU)/L (ref 0.320–4.118)

## 2015-03-02 NOTE — Progress Notes (Signed)
ID: Steven Robinson   DOB: Nov 16, 1950  MR#: [id number]  WSF#:[id number]  PCP:  SU: Georganna Skeans MD; Melissa Montane MD OTHER MD: Eppie Gibson, Marcial Pacas, Jovita Gamma, Doreene Nest, Corky Sox Stoneking   HISTORY OF HEAD AND NECK CANCER:: From the original intake note:   The patient noted an enlarging left neck mass early March 2013, and brought this to the attention of his PCP, Dr. Charleston Poot. She obtained a neck CT at [facility] 08/20/2011, which showed multiple enlarged nodes in the left mid-neck along the jugular v., the largest 2.0 cm. She referred the patient to Melissa Montane in ENT and FNA of one of the enlarged nodes was positive for squamous cell carcinoma ([id number]). A p16 stain was read as equivocal. PET scan 09/03/2011 showed SUVs >6 in the left oropharynx and left jugular chain nodes; SUV of 5.49 was noted in the larynx. There was no evidence of contralateral or disseminated disease.  This was consistent with a clinical T1 N2,stage IVA left oropharyngeal SCCA. However multiple biopsies under Dr. Janace Hoard 09/09/2011 [[id number]] were all negative. The pathologic staging therefore is TX N2, which impacts radiation planning.  The patient met with Drs. Haskell Riling, and IMRT with concurrent chemosensitization was planned. The patient had dental evaluation under Dr. Enrique Sack, several extractions under Dr. Benson Norway, had audiometry (showing mild high-frequency hearing loss) and met with nutrition. A PEG tube placed by IR unfortunately became dislodged and infected, and had to be replaced through an open procedure under Georganna Skeans. This delayed the start of chemotherapy and caused a change in plan from the original CDDP at 100 mg/m2 Q3w to 40 mg/M2 weekly. Radiation was started 10/04/2011 and chemotherapy 10/25/2011. The patient's subsequent history is as detailed below.   INTERVAL HISTORY: Steven Robinson returns today for followup of his head and neck cancer accompanied by his wife Steven Robinson. In general Steven Robinson  is doing well. He continues to have swallowing problems, but he knows how to manage those. Occasionally food does get stuck. He is unable to eat steak for example even if he chooses at persistently. On a couple of locations lifting a suitcase 2 the upper rack he has developed severe lancinating pain in his neck which absolutely paralyzes them. This resolves after some time. He is now carrying a smaller suitcase and not using the upper rack or getting someone to help him. Nevertheless this is a new symptom. It could be due to radiation myelitis, but there are other possible causes as well that are of more concern.  He continues to have neuropathy involving particularly the right hand. He is not taking any medication for this. He is more tired. His wife is very concerned. She said she feels he is now as tired as he was right after he completed radiation. This is of concern. On the other hand he is doing a lot of traveling, about 4 plane rides a week, because of his new job requirements. He is managing to walk 3-5 times a week. He sleeps mostly through the night, usually from 11 PM to 6 AM. He does get "everything done that I need to get done". His just that he feels much more exhausted that he did just a few months ago  REVIEW OF SYSTEMS: A detailed review of systems today is negative except as noted above  PAST MEDICAL HISTORY: Past Medical History  Diagnosis Date  . Hyperlipoproteinemia   . Obstructive sleep apnea     on C-pap.   [name] GERD (gastroesophageal  reflux disease)     chronic  . Hx of colonic polyps   . 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 Vertigo     2/2 intersitial neuritis.   [name] Hearing loss of left ear   . BPH (benign prostatic hyperplasia)   . Hyperlipidemia   . Mucositis 11/01/2011  . S/P radiation therapy 10/04/11-11/22/11    Mucosal Axis/Neck:7000cGy/35Fractions  . Status post chemotherapy     CDDP During with Radiation Therapy  . Gastrostomy tube dependent      removed Oct. 2013  . 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 Neuromuscular disorder      Radiation Myelopathy  . History of EKG     done at PCP office, T. Spears04/07/12  . High cholesterol   . Xerostomia   . Pain     right arm and shoulder  . Ptosis of left eyelid   . Neuropathy     right upper extremity  . Right inguinal hernia   . Ptosis, left eyelid   . Fracture of fibula 7/14    left  . Dizziness and giddiness 01/04/2014    PAST SURGICAL HISTORY: Past Surgical History  Procedure Laterality Date  . Fine needle aspiration  08/26/11     LEFT NECK FNA, LYMPH NODE, METASTATIC SQUAMOUS CELL CARCINOMA  . Gastrostomy  09/24/2011    Procedure: GASTROSTOMY;  Surgeon: Zenovia Jarred, MD;  Location: [place];  Service: General;  Laterality: N/A;  open G-tube placement procedure start $RemoveBeforeD'@1219'fWXxsIdgytDNVL$   . Rigid esophagoscopy  09/24/2011    Procedure: RIGID ESOPHAGOSCOPY;  Surgeon: Melissa Montane, MD;  Location: [place];  Service: ENT;  Laterality: N/A;  Esophagoscopy procedure end $RemoveBefor'@1210'TMsPjIuyWvvY$   . Hernia repair  1990    Left  . Diagnostic laparoscopy      g-tube replacement   . Ventral hernia repair  04/24/2012    Procedure: LAPAROSCOPIC VENTRAL HERNIA;  Surgeon: Zenovia Jarred, MD;  Location: [place];  Service: General;  Laterality: N/A;  laparoscopic repair incisional hernia with mesh  . Inguinal hernia repair  04/24/2012    Procedure: HERNIA REPAIR INGUINAL ADULT;  Surgeon: Zenovia Jarred, MD;  Location: [place];  Service: General;  Laterality: Right;  . Insertion of mesh  04/24/2012    Procedure: INSERTION OF MESH;  Surgeon: Zenovia Jarred, MD;  Location: [place];  Service: General;  Laterality: Right;  . Removal bony sequestrum Left 09/29/12    Left mandible / Area #17  . Peg tube removal  03/2012    FAMILY HISTORY Family History  Problem Relation Age of Onset  . Alzheimer's disease Mother   . Tics Mother   . Hypertension Father   . Cancer Father 82    Prostate  . Stroke Paternal Grandmother   . Cancer  Paternal Grandfather     Lung  and Prostate   the patient's father is alive at age 83. He lives in a retirement community. The patient's mother died at the age of 24 in 09-18-09; she had significant Alzheimer's disease. The patient is an only child.  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 His wife Osvaldo Angst is a Artist in oil. The live on 8 acres and have 2 large horses, a donkey, a Clinical cytogeneticist, chickens, etc. The patient's daughter Caryl Pina lives in [place] and is currently looking for a job. The patient's son Yong Channel works for an Associate Professor.  The patient has 2 grandchildren, granddaughter age 20 and a grandson aged 38 months. He attends the [employer]  ADVANCED DIRECTIVES: in place  HEALTH MAINTENANCE: Social History  Substance Use Topics  . Smoking status: Former Smoker -- 0.25 packs/day for 3 years    Quit date: 06/14/1972  . Smokeless tobacco: Never Used  . Alcohol Use: No     Comment: Hx of Drinking wine - Stopped 3 years ago     Allergies  Allergen Reactions  . Flexeril [Cyclobenzaprine] Anxiety    Confusion    Current Outpatient Prescriptions  Medication Sig Dispense Refill  . aspirin EC 81 MG tablet Take 81 mg by mouth daily.    [name] dexlansoprazole (DEXILANT) 60 MG capsule Take 60 mg by mouth at bedtime.     . docusate sodium (COLACE) 100 MG capsule Take 100 mg by mouth 3 (three) times daily.    [name] MYRBETRIQ 50 MG TB24 tablet Take 50 mg by mouth daily.     . rosuvastatin (CRESTOR) 20 MG tablet Take 20 mg by mouth daily.    . tamsulosin (FLOMAX) 0.4 MG CAPS capsule Take 0.4 mg by mouth daily.    [name] zolpidem (AMBIEN) 10 MG tablet Take 10 mg by mouth at bedtime.      No current facility-administered medications for this visit.    OBJECTIVE: Middle-aged white man who appears stated age 64 Vitals:   03/03/15 1151  BP: 124/78  Pulse: 67  Temp:  97.6 F (36.4 C)  Resp: 18     Body mass index is 28.19 kg/(m^2).    ECOG FS: 1 Filed Weights   03/03/15 1151  Weight: 213 lb 9.6 oz (96.888 kg)   Sclerae unicteric, pupils round and equal Oropharynx slightly dry, there is some erythema in the soft palate; there is no thrush or ulcerations noted No cervical or supraclavicular adenopathy palpated Lungs no rales or rhonchi Heart regular rate and rhythm Abd soft, nontender, positive bowel sounds MSK no focal spinal tenderness, fair range of motion at the neck, no joint edema Neuro: nonfocal, well oriented, appropriate affect      LAB RESULTS:  Lab Results  Component Value Date   WBC 5.5 02/24/2015   NEUTROABS 4.2 02/24/2015   HGB 14.8 02/24/2015   HCT 43.6 02/24/2015   MCV 89.1 02/24/2015   PLT 164 02/24/2015      Chemistry      Component Value Date/Time   NA 142 02/24/2015 1529   NA 137 04/21/2012 1127   K 4.1 02/24/2015 1529   K 4.3 04/21/2012 1127   CL 104 10/03/2012 0803   CL 99 04/21/2012 1127   CO2 26 02/24/2015 1529   CO2 30 04/21/2012 1127   BUN 9.6 02/24/2015 1529   BUN 17 04/21/2012 1127   CREATININE 0.8 02/24/2015 1529   CREATININE 0.73 04/21/2012 1127      Component Value Date/Time   CALCIUM 9.4 02/24/2015 1529   CALCIUM 9.5 04/21/2012 1127   ALKPHOS 59 02/24/2015 1529   ALKPHOS 62 12/13/2011 0950   AST 20 02/24/2015 1529   AST 18 12/13/2011 0950   ALT 17 02/24/2015 1529   ALT 13 12/13/2011 0950   BILITOT 0.62 02/24/2015 1529   BILITOT 0.4 12/13/2011 0950       STUDIES: No results found.   ASSESSMENT: 64 y.o. 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 diagnosed by FNA of a left cervical node March 2013, treated with  IMRT completed 11/22/2011 and concurrent weekly cis-platinum, last dose 11/23/2011  (1) risk factors are smoking history (minimal and remote), significant alcohol use (discontinued 2010), and an equivocal p16 probe  (2) dizziness,  possibly due to autonomic dysfunction from cis-platinum  (3) malnutrition: PEG removed October 2013, stopped Boost supplements February 2014; maintaining weight orally  (4) dry mouth: discontinued pilocarpine and rinses; drinks extra water during meals  (5) at risk of hypothyroidism: TSH normal April 2015 (2.8)  (6) right hand/arm/shoulder pain: 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 He is having several symptoms which could well be due to his prior therapy, but which I am concerned might also be related to tumor recurrence. That includes the lancinating cervical pain that he experiences sometimes as described above, problems swallowing and of course we really have not restaged him now for over a year. He is having fatigue is his only systemic symptoms but that is significant in itself. I think it would be very prudent to go ahead and investigate these issues and I have set him up for a CT of the soft tissues of the neck, MRI of the cervical spine, and a PET scan, to make sure that he is not experiencing disease recurrence.  He is already scheduled to see Dr. Janace Hoard late October which will be after these films so if there is anything specific to evaluate it could be scoped and possibly biopsied at that time. He will follow-up with Dr. Lanell Persons in November.  While the problem with the right hand is almost certainly a neuropathic, I suggested he try a splint at night and see if there is a small element of carpal tunnel which we could improve  on.  Accordingly he will see me again in 6 months. We will do lab work and a physical exam at that time. However I will call in with the results of his scans as soon as they are performed. Cacie Gaskins C    03/03/2015

## 2015-03-03 ENCOUNTER — Ambulatory Visit (HOSPITAL_BASED_OUTPATIENT_CLINIC_OR_DEPARTMENT_OTHER): Payer: Managed Care, Other (non HMO) | Admitting: Oncology

## 2015-03-03 ENCOUNTER — Telehealth: Payer: Self-pay | Admitting: Oncology

## 2015-03-03 VITALS — BP 124/78 | HR 67 | Temp 97.6°F | Resp 18 | Ht 73.0 in | Wt 213.6 lb

## 2015-03-03 DIAGNOSIS — C109 Malignant neoplasm of oropharynx, unspecified: Secondary | ICD-10-CM | POA: Diagnosis not present

## 2015-03-03 DIAGNOSIS — C770 Secondary and unspecified malignant neoplasm of lymph nodes of head, face and neck: Secondary | ICD-10-CM | POA: Diagnosis not present

## 2015-03-03 NOTE — Telephone Encounter (Signed)
Appointments mad and avs printed for patient

## 2015-03-14 ENCOUNTER — Ambulatory Visit (HOSPITAL_COMMUNITY)
Admission: RE | Admit: 2015-03-14 | Discharge: 2015-03-14 | Disposition: A | Discharge status: Home / Self Care | Payer: Managed Care, Other (non HMO) | Source: Ambulatory Visit | Attending: Oncology | Admitting: Oncology

## 2015-03-14 ENCOUNTER — Ambulatory Visit (HOSPITAL_COMMUNITY): Payer: Managed Care, Other (non HMO)

## 2015-03-14 ENCOUNTER — Other Ambulatory Visit: Payer: Self-pay | Admitting: Oncology

## 2015-03-14 DIAGNOSIS — M542 Cervicalgia: Secondary | ICD-10-CM | POA: Insufficient documentation

## 2015-03-14 DIAGNOSIS — R1310 Dysphagia, unspecified: Secondary | ICD-10-CM | POA: Insufficient documentation

## 2015-03-14 DIAGNOSIS — M47892 Other spondylosis, cervical region: Secondary | ICD-10-CM | POA: Diagnosis not present

## 2015-03-14 DIAGNOSIS — M8580 Other specified disorders of bone density and structure, unspecified site: Secondary | ICD-10-CM | POA: Diagnosis not present

## 2015-03-14 DIAGNOSIS — C109 Malignant neoplasm of oropharynx, unspecified: Secondary | ICD-10-CM | POA: Diagnosis present

## 2015-03-14 DIAGNOSIS — Z9221 Personal history of antineoplastic chemotherapy: Secondary | ICD-10-CM | POA: Diagnosis not present

## 2015-03-14 DIAGNOSIS — I7090 Unspecified atherosclerosis: Secondary | ICD-10-CM | POA: Insufficient documentation

## 2015-03-14 DIAGNOSIS — Z923 Personal history of irradiation: Secondary | ICD-10-CM | POA: Insufficient documentation

## 2015-03-14 DIAGNOSIS — R5383 Other fatigue: Secondary | ICD-10-CM | POA: Diagnosis not present

## 2015-03-14 MED ORDER — GADOBENATE DIMEGLUMINE 529 MG/ML IV SOLN
20.0000 mL | Freq: Once | INTRAVENOUS | Status: AC | PRN
  Administered 2015-03-14: 20 mL via INTRAVENOUS

## 2015-03-14 MED ORDER — IOHEXOL 300 MG/ML  SOLN
75.0000 mL | Freq: Once | INTRAMUSCULAR | Status: AC | PRN
  Administered 2015-03-14: 75 mL via INTRAVENOUS

## 2015-03-17 ENCOUNTER — Telehealth: Payer: Self-pay

## 2015-03-17 NOTE — Telephone Encounter (Signed)
Called patient with imaging results per Dr.Magrinat.  Patient very happy that the CT scan and MRI both came back with no evidence of cancer.

## 2015-03-18 ENCOUNTER — Ambulatory Visit (HOSPITAL_COMMUNITY)
Admission: RE | Admit: 2015-03-18 | Discharge: 2015-03-18 | Disposition: A | Discharge status: Home / Self Care | Payer: Managed Care, Other (non HMO) | Source: Ambulatory Visit | Attending: Oncology | Admitting: Oncology

## 2015-03-18 DIAGNOSIS — Z923 Personal history of irradiation: Secondary | ICD-10-CM | POA: Diagnosis not present

## 2015-03-18 DIAGNOSIS — J8410 Pulmonary fibrosis, unspecified: Secondary | ICD-10-CM | POA: Diagnosis not present

## 2015-03-18 DIAGNOSIS — K5730 Diverticulosis of large intestine without perforation or abscess without bleeding: Secondary | ICD-10-CM | POA: Diagnosis not present

## 2015-03-18 DIAGNOSIS — I2510 Atherosclerotic heart disease of native coronary artery without angina pectoris: Secondary | ICD-10-CM | POA: Insufficient documentation

## 2015-03-18 DIAGNOSIS — R1310 Dysphagia, unspecified: Secondary | ICD-10-CM | POA: Diagnosis not present

## 2015-03-18 DIAGNOSIS — C109 Malignant neoplasm of oropharynx, unspecified: Secondary | ICD-10-CM | POA: Insufficient documentation

## 2015-03-18 DIAGNOSIS — M542 Cervicalgia: Secondary | ICD-10-CM | POA: Diagnosis not present

## 2015-03-18 DIAGNOSIS — Z9221 Personal history of antineoplastic chemotherapy: Secondary | ICD-10-CM | POA: Diagnosis not present

## 2015-03-18 DIAGNOSIS — R918 Other nonspecific abnormal finding of lung field: Secondary | ICD-10-CM | POA: Diagnosis not present

## 2015-03-18 DIAGNOSIS — I700 Atherosclerosis of aorta: Secondary | ICD-10-CM | POA: Diagnosis not present

## 2015-03-18 LAB — GLUCOSE, CAPILLARY: GLUCOSE-CAPILLARY: 95 mg/dL (ref 65–99)

## 2015-03-18 MED ORDER — FLUDEOXYGLUCOSE F - 18 (FDG) INJECTION: 10.5700 | Freq: Once | INTRAVENOUS | Status: DC | PRN

## 2015-04-22 NOTE — Progress Notes (Signed)
Mr. Delaughter presents for follow up of radiation to his head and neck completed 11/22/11.  Pain issues, if any: No Using a feeding tube?: No Weight changes, if any:  Wt Readings from Last 3 Encounters:  04/23/15 214 lb 11.2 oz (97.387 kg)  03/03/15 213 lb 9.6 oz (96.888 kg)  08/20/14 217 lb 3.2 oz (98.521 kg)   Swallowing issues, if any: He states he eats softer foods, he avoids dense foods including red meats. He drinks sips of water with food to "help the food go down better". Smoking or chewing tobacco? No Using fluoride trays daily? He uses fluoride tray on the upper part of his mouth daily, but his bottom one needs to be fixed.  Last ENT visit was on: He saw Dr. Janace Hoard 2 weeks ago with no problems noted.  He has no follow up scheduled at this time.  Other notable issues, if any: He does complain of a dry mouth, and has water with him all the time. He is exercising and also traveling with work frequently.

## 2015-04-23 ENCOUNTER — Ambulatory Visit
Admission: RE | Admit: 2015-04-23 | Discharge: 2015-04-23 | Disposition: A | Discharge status: Home / Self Care | Payer: Managed Care, Other (non HMO) | Source: Ambulatory Visit | Attending: Radiation Oncology | Admitting: Radiation Oncology

## 2015-04-23 ENCOUNTER — Encounter: Payer: Self-pay | Admitting: Radiation Oncology

## 2015-04-23 ENCOUNTER — Encounter: Payer: Self-pay | Admitting: Adult Health

## 2015-04-23 VITALS — BP 131/84 | HR 61 | Temp 97.9°F | Ht 73.0 in | Wt 214.7 lb

## 2015-04-23 DIAGNOSIS — C770 Secondary and unspecified malignant neoplasm of lymph nodes of head, face and neck: Secondary | ICD-10-CM

## 2015-04-23 NOTE — Progress Notes (Signed)
Radiation Oncology         [phone] ________________________________  Name: Steven Robinson MRN: [id number]  Date: 04/23/2015  DOB: 1951/02/21  Follow-Up Visit Note  CC: Velna Hatchet, MD  Melissa Montane, MD  Diagnosis and Prior Radiotherapy:  Stage IVA [id number] Squamous cell carcinoma, head and neck, unknown primary . Presented with left neck nodes.   Interval Since Last Radiation: Completed 70 Gy/35 fractions on 11-22-11 with concurrent cis-platinum  Narrative:  The patient returns today for routine follow-up of radiation to his head and neck completed 11/22/11. He denies any pain at this time. No feeding tube in place. Weight is stable. He states he eats softer foods, he avoids dense foods including red meats. He drinks sips of water with food to "help the food go down better". Denies smoking or chewing tobacco use. He uses fluoride tray on the upper part of his mouth daily, but his bottom one needs to be fixed. He saw Dr. Janace Hoard 2 weeks ago with no problems noted. He has no follow up scheduled at this time. He last saw Dr. Jana Hakim on 03/03/15. He does complain of a dry mouth, and has water with him all the time. He is exercising and also traveling with work frequently. Feels good.  ALLERGIES:  is allergic to flexeril.  Meds: Current Outpatient Prescriptions  Medication Sig Dispense Refill  . aspirin EC 81 MG tablet Take 81 mg by mouth daily.    [name] dexlansoprazole (DEXILANT) 60 MG capsule Take 60 mg by mouth at bedtime.     [name] MYRBETRIQ 50 MG TB24 tablet Take 50 mg by mouth daily.     . rosuvastatin (CRESTOR) 20 MG tablet Take 20 mg by mouth daily.    . tamsulosin (FLOMAX) 0.4 MG CAPS capsule Take 0.4 mg by mouth daily.    [name] zolpidem (AMBIEN) 10 MG tablet Take 10 mg by mouth at bedtime.     . docusate sodium (COLACE) 100 MG capsule Take 100 mg by mouth daily.      No current facility-administered medications for this encounter.    Physical Findings: The patient is in no acute  distress. Patient is alert and oriented.  height is 6\' 1"  (1.854 m) and weight is 214 lb 11.2 oz (97.387 kg). His temperature is 97.9 F (36.6 C). His blood pressure is 131/84 and his pulse is 61. .     General: Well appearing.  Mouth: No oropharyngeal lesions or thrush.  Neck: No neck adenopathy in cervical or supraclavicular regions.  Heart: Regular rhythm and rate with no murmur, rubs, or gallops. Lungs: Clear to auscultation with no wheezes or rales. Skin: No irritation in the previously treated area.  Lab Findings: Lab Results  Component Value Date   WBC 5.5 02/24/2015   HGB 14.8 02/24/2015   HCT 43.6 02/24/2015   MCV 89.1 02/24/2015   PLT 164 02/24/2015    Lab Results  Component Value Date   TSH 1.671 02/24/2015    Radiographic Findings: No results found.  Impression/Plan:    1) Head and Neck Cancer Status:  NED  2) Nutritional Status: no issues - PEG tube: none  3) Risk Factors: The patient has been educated about risk factors including alcohol and tobacco abuse; they understand that avoidance of alcohol and tobacco is important to prevent recurrences as well as other cancers.  4) Swallowing: No issues    5) Dental: He has healed from prior bone sequestion. Continues fluoride rinses, regular checkups   6) Energy:  Good.  Lab Results  Component Value Date   TSH 1.671 02/24/2015    7) Social: No active social issues to address at this time  8) Neck lymphedema: resolved  9) Xerostomia: manageable with H20 sips.   10) Follow-up in 12 months w/ TSH. I do not personally recommend follow up imaging unless he has signs or symptoms to warrant it. The patient was encouraged to call with any issues or questions before then. The patient continues to follow with Dr. Jana Hakim in medical oncology. Recommend he see Dr Janace Hoard as well.  _____________________________________   Eppie Gibson, MD   This document serves as a record of services personally performed by  Eppie Gibson, MD. It was created on her behalf by Arlyce Harman, a trained medical scribe. The creation of this record is based on the scribe's personal observations and the provider's statements to them. This document has been checked and approved by the attending provider.

## 2015-04-23 NOTE — Progress Notes (Signed)
I briefly met Mr. Steven Robinson and his wife today during a routine radiation oncology follow-up visit with Dr. Squire.  Mr. Steven Robinson is doing very well after completing treatment for neck cancer of unknown primary in 11/2011.  I introduced myself and my role as the current Survivorship NP for our head & neck survivors.  I gave him a copy of the "Life After Cancer for Every Survivor" booklet, along with my business card.  He was encouraged to call me with any questions or concerns and I would be happy to help.   He will continue his surveillance with Dr. Squire until his 5-year "graduation" visit.  The patient knows he can call me, as an additional resource/provider on his team, if he has any questions or concerns before his next appt here.    Gretchen Dawson, NP Survivorship Program [facility] [phone]  

## 2015-04-24 ENCOUNTER — Telehealth: Payer: Self-pay | Admitting: *Deleted

## 2015-04-24 NOTE — Telephone Encounter (Signed)
CALLED PATIENT TO INFORM OF LAB ON 04-23-16 @ 11 AM, SPOKE WITH PATIENT AND HE IS AWARE OF THIS LAB

## 2015-04-25 ENCOUNTER — Ambulatory Visit: Payer: Managed Care, Other (non HMO) | Admitting: Radiation Oncology

## 2015-08-01 ENCOUNTER — Telehealth: Payer: Self-pay | Admitting: Oncology

## 2015-08-01 NOTE — Telephone Encounter (Signed)
Patient left message requesting that 3/20 lab appointment be moved to 3/17 due to he will be out of town - per patient he will keep 3/27 f/u with GM as scheduled. Returned call confirming change and gave patient new date/time for lab 3/17 at 1 pm per his request and confirmed 3/27 f/u at 1 pm.

## 2015-08-29 ENCOUNTER — Other Ambulatory Visit (HOSPITAL_BASED_OUTPATIENT_CLINIC_OR_DEPARTMENT_OTHER): Payer: Managed Care, Other (non HMO)

## 2015-08-29 DIAGNOSIS — C770 Secondary and unspecified malignant neoplasm of lymph nodes of head, face and neck: Secondary | ICD-10-CM | POA: Diagnosis not present

## 2015-08-29 LAB — TSH: TSH: 1.7 m[IU]/L (ref 0.320–4.118)

## 2015-09-01 ENCOUNTER — Other Ambulatory Visit: Payer: Self-pay

## 2015-09-08 ENCOUNTER — Ambulatory Visit (HOSPITAL_BASED_OUTPATIENT_CLINIC_OR_DEPARTMENT_OTHER): Payer: Managed Care, Other (non HMO) | Admitting: Oncology

## 2015-09-08 ENCOUNTER — Telehealth: Payer: Self-pay | Admitting: Oncology

## 2015-09-08 VITALS — BP 118/75 | HR 59 | Temp 97.3°F | Resp 18 | Ht 73.0 in | Wt 214.1 lb

## 2015-09-08 DIAGNOSIS — M542 Cervicalgia: Secondary | ICD-10-CM

## 2015-09-08 DIAGNOSIS — C109 Malignant neoplasm of oropharynx, unspecified: Secondary | ICD-10-CM

## 2015-09-08 NOTE — Progress Notes (Signed)
ID: Steven Robinson   DOB: 03/30/1951  MR#: [id number]  VZD#:[id number]  PCP:  SU: Georganna Skeans MD; Melissa Montane MD OTHER MD: Eppie Gibson, Marcial Pacas, Jovita Gamma, Doreene Nest, Corky Sox Stoneking   HISTORY OF HEAD AND NECK CANCER:: From the original intake note:   The patient noted an enlarging left neck mass early March 2013, and brought this to the attention of his PCP, Dr. Charleston Poot. She obtained a neck CT at [facility] 08/20/2011, which showed multiple enlarged nodes in the left mid-neck along the jugular v., the largest 2.0 cm. She referred the patient to Melissa Montane in ENT and FNA of one of the enlarged nodes was positive for squamous cell carcinoma ([id number]). A p16 stain was read as equivocal. PET scan 09/03/2011 showed SUVs >6 in the left oropharynx and left jugular chain nodes; SUV of 5.49 was noted in the larynx. There was no evidence of contralateral or disseminated disease.  This was consistent with a clinical T1 N2,stage IVA left oropharyngeal SCCA. However multiple biopsies under Dr. Janace Hoard 09/09/2011 [[id number]] were all negative. The pathologic staging therefore is TX N2, which impacts radiation planning.  The patient met with Drs. Haskell Riling, and IMRT with concurrent chemosensitization was planned. The patient had dental evaluation under Dr. Enrique Sack, several extractions under Dr. Benson Norway, had audiometry (showing mild high-frequency hearing loss) and met with nutrition. A PEG tube placed by IR unfortunately became dislodged and infected, and had to be replaced through an open procedure under Georganna Skeans. This delayed the start of chemotherapy and caused a change in plan from the original CDDP at 100 mg/m2 Q3w to 40 mg/M2 weekly. Radiation was started 10/04/2011 and chemotherapy 10/25/2011. The patient's subsequent history is as detailed below.   INTERVAL HISTORY: Steven Robinson today for followup of his head and neck cancer. His wife Diannhad to stay home as they are  building a writing area further grandchildren and "somebody have to look over the workers".  Steven Robinson continues to have a normal life, traveling and working as well as enjoying his family. He does have swallowing difficulties at times, but these were rare. He always drinks a lot of water or other fluids with his food. His been able to maintain his weight by doing this.  He also still has the lancinating neck pains that are provoked by certain positions or activities, but fortunately these are relatively rare. They may happen once or at most twice a week. They're very brief. They have not increased in frequency, or intensity, although when they occur they are quite intense. We evaluated this last year and he does have significant degenerative disease in the neck, but no evidence of cancer recurrence.  REVIEW OF SYSTEMS: Aside from the problems just mentioned a detailed review of systems today was noncontributory  PAST MEDICAL HISTORY: Past Medical History  Diagnosis Date  . Hyperlipoproteinemia   . Obstructive sleep apnea     on C-pap.   [name] GERD (gastroesophageal reflux disease)     chronic  . Hx of colonic polyps   . 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 Vertigo     2/2 intersitial neuritis.   [name] Hearing loss of left ear   . BPH (benign prostatic hyperplasia)   . Hyperlipidemia   . Mucositis 11/01/2011  . S/P radiation therapy 10/04/11-11/22/11    Mucosal Axis/Neck:7000cGy/35Fractions  . Status post chemotherapy     CDDP During with Radiation Therapy  . Gastrostomy tube dependent ([place])  removed Oct. 2013  . Oropharynx cancer ([place]) 08/26/2011  . Neuromuscular disorder ([place])      Radiation Myelopathy  . History of EKG     done at PCP office, T. Spears2012-02-12  . High cholesterol   . Xerostomia   . Pain     right arm and shoulder  . Ptosis of left eyelid   . Neuropathy ([place])     right upper extremity  . Right inguinal hernia   . Ptosis, left eyelid   . Fracture of fibula  7/14    left  . Dizziness and giddiness 01/04/2014    PAST SURGICAL HISTORY: Past Surgical History  Procedure Laterality Date  . Fine needle aspiration  08/26/11     LEFT NECK FNA, LYMPH NODE, METASTATIC SQUAMOUS CELL CARCINOMA  . Gastrostomy  09/24/2011    Procedure: GASTROSTOMY;  Surgeon: Zenovia Jarred, MD;  Location: [place];  Service: General;  Laterality: N/A;  open G-tube placement procedure start _0   . Rigid esophagoscopy  09/24/2011    Procedure: RIGID ESOPHAGOSCOPY;  Surgeon: Melissa Montane, MD;  Location: [place];  Service: ENT;  Laterality: N/A;  Esophagoscopy procedure end _1   . Hernia repair  1990    Left  . Diagnostic laparoscopy      g-tube replacement   . Ventral hernia repair  04/24/2012    Procedure: LAPAROSCOPIC VENTRAL HERNIA;  Surgeon: Zenovia Jarred, MD;  Location: [place];  Service: General;  Laterality: N/A;  laparoscopic repair incisional hernia with mesh  . Inguinal hernia repair  04/24/2012    Procedure: HERNIA REPAIR INGUINAL ADULT;  Surgeon: Zenovia Jarred, MD;  Location: [place];  Service: General;  Laterality: Right;  . Insertion of mesh  04/24/2012    Procedure: INSERTION OF MESH;  Surgeon: Zenovia Jarred, MD;  Location: [place];  Service: General;  Laterality: Right;  . Removal bony sequestrum Left 09/29/12    Left mandible / Area #17  . Peg tube removal  03/2012    FAMILY HISTORY Family History  Problem Relation Age of Onset  . Alzheimer's disease Mother   . Tics Mother   . Hypertension Father   . Cancer Father 31    Prostate  . Stroke Paternal Grandmother   . Cancer Paternal Grandfather     Lung  and Prostate   the patient's father is alive at age 2. He lives in a retirement community. The patient's mother died at the age of 58 in 2009/07/26; she had significant Alzheimer's disease. The patient is an only child.  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  His wife Osvaldo Angst is a Artist in oil. The live on 8 acres and have 2 large horses, a donkey, a Clinical cytogeneticist, chickens, etc. The patient's daughter Caryl Pina lives in [place] and is currently looking for a job. The patient's son Yong Channel works for an Associate Professor. The patient has 2 grandchildren, granddaughter age 70 and a grandson aged 65 months. He attends the [employer]  ADVANCED DIRECTIVES: in place  HEALTH MAINTENANCE: Social History  Substance Use Topics  . Smoking status: Former Smoker -- 0.25 packs/day for 3 years    Quit date: 06/14/1972  . Smokeless tobacco: Never Used  . Alcohol Use: No     Comment: Hx of Drinking wine - Stopped 3 years ago     Allergies  Allergen Reactions  . Flexeril [Cyclobenzaprine] Anxiety  Confusion    Current Outpatient Prescriptions  Medication Sig Dispense Refill  . aspirin EC 81 MG tablet Take 81 mg by mouth daily.    [name] dexlansoprazole (DEXILANT) 60 MG capsule Take 60 mg by mouth at bedtime.     . docusate sodium (COLACE) 100 MG capsule Take 100 mg by mouth daily.     [name] MYRBETRIQ 50 MG TB24 tablet Take 50 mg by mouth daily.     . rosuvastatin (CRESTOR) 20 MG tablet Take 20 mg by mouth daily.    . tamsulosin (FLOMAX) 0.4 MG CAPS capsule Take 0.4 mg by mouth daily.    [name] zolpidem (AMBIEN) 10 MG tablet Take 10 mg by mouth at bedtime.      No current facility-administered medications for this visit.    OBJECTIVE: Middle-aged white man in no acute distress Filed Vitals:   09/08/15 1330  BP: 118/75  Pulse: 59  Temp: 97.3 F (36.3 C)  Resp: 18     Body mass index is 28.25 kg/(m^2).    ECOG FS: 1 Filed Weights   09/08/15 1330  Weight: 214 lb 1.6 oz (97.115 kg)   Sclerae unicteric, EOMs intact Oropharynx clear, slightly dry, no thrush or other lesions No cervical or supraclavicular adenopathy Lungs no rales or rhonchi Heart regular rate and rhythm Abd soft, nontender, positive bowel sounds MSK no focal  spinal tenderness, no upper extremity lymphedema Neuro: nonfocal, well oriented, appropriate affect   LAB RESULTS:  Lab Results  Component Value Date   WBC 5.5 02/24/2015   NEUTROABS 4.2 02/24/2015   HGB 14.8 02/24/2015   HCT 43.6 02/24/2015   MCV 89.1 02/24/2015   PLT 164 02/24/2015      Chemistry      Component Value Date/Time   NA 142 02/24/2015 1529   NA 137 04/21/2012 1127   K 4.1 02/24/2015 1529   K 4.3 04/21/2012 1127   CL 104 10/03/2012 0803   CL 99 04/21/2012 1127   CO2 26 02/24/2015 1529   CO2 30 04/21/2012 1127   BUN 9.6 02/24/2015 1529   BUN 17 04/21/2012 1127   CREATININE 0.8 02/24/2015 1529   CREATININE 0.73 04/21/2012 1127      Component Value Date/Time   CALCIUM 9.4 02/24/2015 1529   CALCIUM 9.5 04/21/2012 1127   ALKPHOS 59 02/24/2015 1529   ALKPHOS 62 12/13/2011 0950   AST 20 02/24/2015 1529   AST 18 12/13/2011 0950   ALT 17 02/24/2015 1529   ALT 13 12/13/2011 0950   BILITOT 0.62 02/24/2015 1529   BILITOT 0.4 12/13/2011 0950       STUDIES: CLINICAL DATA: Restaging squamous cell carcinoma oropharynx. Previous chemotherapy and radiation therapy. Fatigue. Lancinating neck pain. Difficulty swallowing.  EXAM: CT NECK WITH CONTRAST  TECHNIQUE: Multidetector CT imaging of the neck was performed using the standard protocol following the bolus administration of intravenous contrast.  CONTRAST: 62m OMNIPAQUE IOHEXOL 300 MG/ML SOLN  COMPARISON: None.  FINDINGS: Pharynx and larynx: Unremarkable. No recurrent tumor.  Salivary glands: Posttreatment change.  Thyroid: Normal.  Lymph nodes: No pathologic adenopathy. Previous LEFT level IIA node shows continued involution, only 3 mm short axis as seen on image 33.  Vascular: Negative for flow reducing lesion.  Limited intracranial: Negative.  Visualized orbits: Not seen.  Mastoids and visualized paranasal sinuses: No visible air-fluid level.  Skeleton: Osteopenia  and spondylosis. No compression fracture. No worrisome findings for metastatic osseous disease.  Upper chest: No lung apex lesion. Atherosclerosis.  IMPRESSION: No  evidence for recurrent or pharyngeal squamous cell carcinoma. No pathologic adenopathy.   Electronically Signed  By: Staci Righter M.D.  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 Patient has left neck pain and dysphagia. Restaging.  EXAM: NUCLEAR MEDICINE PET SKULL BASE TO THIGH  TECHNIQUE: 10.6 mCi F-18 FDG was injected intravenously. Full-ring PET imaging was performed from the skull base to thigh after the radiotracer. CT data was obtained and used for attenuation correction and anatomic localization.  FASTING BLOOD GLUCOSE: Value: 95 mg/dl  COMPARISON: 04/02/2013, 06/12/2012, 02/24/2012 PET-CT studies.  FINDINGS: NECK  Nonfocal curvilinear hypermetabolism in the bilateral larynx without CT correlate, not appreciably changed, in keeping with physiologic and/or posttreatment hypermetabolism.  No hypermetabolic lymph nodes in the neck.  CHEST  No hypermetabolic axillary, mediastinal or hilar nodes. There is atherosclerosis of the thoracic aorta, the great vessels of the mediastinum and the coronary arteries, including calcified atherosclerotic plaque in the left anterior descending and right coronary arteries. No acute consolidative airspace disease. Tiny 3 mm subpleural pulmonary nodules in the right middle lobe (series 6/ image 44 and 6/51) are stable since 2006 and benign. Anterior right lower lobe 4 mm pulmonary nodule (6/48) is stable since 2006 and benign. Stable calcified left lower lobe subcentimeter granuloma. No new significant pulmonary  nodules.  ABDOMEN/PELVIS  No abnormal hypermetabolic activity within the liver, pancreas, adrenal glands, or spleen. No hypermetabolic lymph nodes in the abdomen or pelvis. Mild sigmoid diverticulosis. Stable moderate prostatomegaly. Stable focal diastasis in the supraumbilical ventral abdominal wall.  SKELETON  No focal hypermetabolic activity to suggest skeletal metastasis.  IMPRESSION: 1. No evidence of hypermetabolic locoregional or distant recurrence. 2. Atherosclerosis, including two-vessel coronary artery disease.   Electronically Signed  By: Ilona Sorrel M.D.  On: 03/18/2015 12:01 CLINICAL DATA: 65 year old male with stage IV head and neck squamous cell carcinoma. Chemotherapy and radiation therapy June 2013. Worsening neck pain. Dysphagia.  EXAM: MRI CERVICAL SPINE WITHOUT AND WITH CONTRAST  TECHNIQUE: Multiplanar and multiecho pulse sequences of the cervical spine, to include the craniocervical junction and cervicothoracic junction, were obtained according to standard protocol without and with intravenous contrast.  CONTRAST: 40m MULTIHANCE GADOBENATE DIMEGLUMINE 529 MG/ML IV SOLN  COMPARISON: CT neck performed same date and dictated separately. No comparison cervical spine MR.  FINDINGS: Bone marrow changes from prior radiation.  Schmorl's node deformities noted from C2-3 thru C7-T1. Minimal amount of edema within the superior endplate of the C7 vertebral body most consistent with mild degenerative changes rather than metastatic disease.  4 mm rounded area of altered signal intensity and enhancement within the central aspect of the C7 vertebral body may represent minimal reactive changes from the Schmorl's node deformity rather than metastatic disease. There are no other areas of abnormal enhancement to suggest metastatic disease. If the patient had progressive symptoms, than this area could be further evaluated on  follow-up imaging to exclude less likely consideration of metastatic disease.  No focal cervical cord signal abnormality or enhancement.  C2-3: Fusion right facet joint  C3-4: Minimal bulge/spur greater to right. Minimal narrowing ventral aspect of the thecal sac greater on right. Minimal right foraminal narrowing.  C4-5: Mild bulge. Slight narrowing ventral aspect of thecal sac. Right uncinate hypertrophy. Mild right foraminal narrowing.  C5-6: Broad-based disc osteophyte complex greatest  centrally. Mild narrowing ventral aspect of the thecal sac with minimal cord flattening.  C6-7: Minimal bulge.  C7-T1: Minimal anterior slips C7. Minimal bulge. Minimal foraminal narrowing.  T1-2 through T3-4: Minimal bulge.  IMPRESSION: No definitive osseous metastatic disease. 4 mm area of altered signal intensity within the C7 vertebral body may reflect reactive changes from adjacent small Schmorl's deformity rather than metastatic disease. This could be reassessed on follow-up if the patient had progressive symptoms.  Cervical spondylotic changes most notable C5-6 as detailed above.   Electronically Signed  By: Genia Del M.D.  On: 03/14/2015 17:25  ASSESSMENT: 65 y.o. 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 diagnosed by FNA of a left cervical node March 2013, treated with IMRT completed 11/22/2011 and concurrent weekly cis-platinum, last dose 11/23/2011  (1) risk factors are smoking history (minimal and remote), significant alcohol use (discontinued 2010), and an equivocal p16 probe  (2) dizziness, possibly due to autonomic dysfunction from cis-platinum  (3) malnutrition: PEG removed October 2013, stopped Boost supplements February 2014; maintaining weight orally  (4) dry mouth: discontinued pilocarpine and rinses; drinks extra water during meals  (5) at risk of hypothyroidism: TSH normal April 2015 (2.8)  (6) right  hand/arm/shoulder pain: 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 This is very favorable.  We discussed the lancinating neck pain that he experiences perhaps on a once a week basis. These have not become more frequent. If they did, he will let me know and I will refer him to neurosurgery. For now though he just wants to "keep an eye on it".  \we discussed his thyroid studies which are normal.  He follows up with Dr. Janace Hoard yearly and Dr. Isidore Moos yearly. We are going to start seeing him yearly also from this point. He knows to call for any problems that may develop before the next visit.  MAGRINAT,GUSTAV C    09/08/2015

## 2015-09-08 NOTE — Telephone Encounter (Signed)
Gave patient avs report and appointments for March 2018.  °

## 2016-04-05 ENCOUNTER — Telehealth: Payer: Self-pay | Admitting: *Deleted

## 2016-04-05 NOTE — Telephone Encounter (Signed)
Called patient to alter lab and fu for Nov. 10 due to Dr. Isidore Moos being off, lab and fu have been rescheduled for 05-05-16, patient agreed to new date and times.

## 2016-04-23 ENCOUNTER — Ambulatory Visit: Payer: Managed Care, Other (non HMO) | Admitting: Radiation Oncology

## 2016-04-23 ENCOUNTER — Ambulatory Visit: Payer: Managed Care, Other (non HMO)

## 2016-05-02 NOTE — Progress Notes (Signed)
Steven Robinson presents for follow up of radiation completed 11/22/11 to his Mucosal axis/ neck.    Pain issues, if any:  He denies Using a feeding tube?: No Weight changes, if any:  Wt Readings from Last 3 Encounters:  05/05/16 212 lb (96.2 kg)  09/08/15 214 lb 1.6 oz (97.1 kg)  04/23/15 214 lb 11.2 oz (97.4 kg)   Swallowing issues, if any: He needs to chew his food well, and moisten it or drink water. He will eat softer foods. Smoking or chewing tobacco? No Using fluoride trays daily? Yes, daily Last ENT visit was on: He believes his last visit with Dr. Janace Hoard was about a year ago.  Other notable issues, if any:  He has sharp posterior neck pain when reaching a certain way, looking down, laughing. He states it comes and goes quickly, but is severe when happening.  He has numbness in his Right hand last two fingers.  He also reports burning that started within the last 6 months in on the outside of his Right foot and last few toes.   BP (!) 125/98   Pulse (!) 58   Temp 97.7 F (36.5 C)   Ht 6\' 1"  (1.854 m)   Wt 212 lb (96.2 kg)   SpO2 99% Comment: room air  BMI 27.97 kg/m

## 2016-05-05 ENCOUNTER — Ambulatory Visit: Payer: Managed Care, Other (non HMO) | Admitting: Radiation Oncology

## 2016-05-05 ENCOUNTER — Ambulatory Visit: Payer: Managed Care, Other (non HMO)

## 2016-05-05 ENCOUNTER — Ambulatory Visit
Admission: RE | Admit: 2016-05-05 | Discharge: 2016-05-05 | Disposition: A | Discharge status: Home / Self Care | Payer: Managed Care, Other (non HMO) | Source: Ambulatory Visit | Attending: Radiation Oncology | Admitting: Radiation Oncology

## 2016-05-05 ENCOUNTER — Encounter: Payer: Self-pay | Admitting: Radiation Oncology

## 2016-05-05 VITALS — BP 125/98 | HR 58 | Temp 97.7°F | Ht 73.0 in | Wt 212.0 lb

## 2016-05-05 DIAGNOSIS — Z79899 Other long term (current) drug therapy: Secondary | ICD-10-CM | POA: Diagnosis not present

## 2016-05-05 DIAGNOSIS — C109 Malignant neoplasm of oropharynx, unspecified: Secondary | ICD-10-CM

## 2016-05-05 DIAGNOSIS — Z5189 Encounter for other specified aftercare: Secondary | ICD-10-CM | POA: Insufficient documentation

## 2016-05-05 DIAGNOSIS — Z7982 Long term (current) use of aspirin: Secondary | ICD-10-CM | POA: Insufficient documentation

## 2016-05-05 DIAGNOSIS — C770 Secondary and unspecified malignant neoplasm of lymph nodes of head, face and neck: Secondary | ICD-10-CM

## 2016-05-05 DIAGNOSIS — Z923 Personal history of irradiation: Secondary | ICD-10-CM | POA: Diagnosis not present

## 2016-05-05 NOTE — Progress Notes (Signed)
Radiation Oncology         [phone] ________________________________  Name: Steven Robinson MRN: [id number]  Date: 05/05/2016  DOB: 1951/05/28  Follow-Up Visit Note  CC: Velna Hatchet, MD  Melissa Montane, MD  Diagnosis and Prior Radiotherapy:  Stage IVA [id number] Squamous cell carcinoma, head and neck, unknown primary . Presented with left neck nodes   ICD-9-CM ICD-10-CM   1. Malignant neoplasm of oropharynx ([facility]) 146.9 C10.9 TSH  2. 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 He is doing very well.  He denies pain at this time.   Weight changes, if any: it is Stable 05/05/16 212 lb (96.2 kg) 09/08/15 214 lb 1.6 oz (97.1 kg) 04/23/15 214 lb 11.2 oz (97.4 kg)  Swallowing issues, if any: He needs to chew his food well and moisten it by drinking water. He will eat softer foods. Smoking or chewing tobacco? No Using fluoride trays daily? Yes, daily Last ENT visit was on: He believes his last visit with Dr. Janace Hoard was about a year ago.  Other notable issues, if any: He has sharp posterior neck pain when reaching a certain way, looking down, or laughing. He states it comes and goes quickly, but is severe when it happens. He has numbness in the last two fingers of his right hand. He also reports burning that started within the last 6 months on the outside of his right foot and last few toes. The patient followed up with Dr. Jana Hakim on 09/08/15.  ALLERGIES:  is allergic to flexeril [cyclobenzaprine].  Meds: Current Outpatient Prescriptions  Medication Sig Dispense Refill  . aspirin EC 81 MG tablet Take 81 mg by mouth daily.    [name] dexlansoprazole  (DEXILANT) 60 MG capsule Take 60 mg by mouth at bedtime.     . docusate sodium (COLACE) 100 MG capsule Take 100 mg by mouth daily.     [name] MYRBETRIQ 50 MG TB24 tablet Take 50 mg by mouth daily.     . rosuvastatin (CRESTOR) 20 MG tablet Take 20 mg by mouth daily.    . sodium fluoride (DENTAGEL) 1.1 % GEL dental gel     . tamsulosin (FLOMAX) 0.4 MG CAPS capsule Take 0.4 mg by mouth daily.    [name] zolpidem (AMBIEN) 10 MG tablet Take 10 mg by mouth at bedtime.      No current facility-administered medications for this encounter.     Physical Findings: The patient is in no acute distress. Patient is alert and oriented.  height is 6\' 1"  (1.854 m) and weight is 212 lb (96.2 kg). His temperature is 97.7 F (36.5 C). His blood pressure is 125/98 (abnormal) and his pulse is 58 (abnormal). His oxygen saturation is 99%. . General: Well appearing. In no acute distress. Mouth: No oropharyngeal lesions or thrush. Oral cavity is somewhat dry. Neck: No neck adenopathy in cervical or supraclavicular regions.  Heart: Regular rhythm and rate with no murmur, rubs, or gallops. Lungs: Clear to auscultation with no wheezes or rales. Skin: No irritation in the previously treated area. Ambulatory, no obvious neurologic deficits.  Lab Findings: Lab Results  Component Value Date   WBC 5.5 02/24/2015  HGB 14.8 02/24/2015   HCT 43.6 02/24/2015   MCV 89.1 02/24/2015   PLT 164 02/24/2015    Lab Results  Component Value Date   TSH 1.700 08/29/2015    Radiographic Findings: No results found.  Impression/Plan:   1) Head and Neck Cancer Status:  NED - cured.  2) Nutritional Status: no issues - PEG tube: none  3) Risk Factors: The patient has been educated about risk factors including alcohol and tobacco abuse; he understands that avoidance of alcohol and tobacco is important to prevent recurrences as well as other cancers. Abstaining.   4) Swallowing: No issues  5) Dental: He has healed from prior bone  sequestion. Continues fluoride rinses, regular checkups.  6) Energy:  Good.  TSH results pending today. Lab Results  Component Value Date   TSH 1.700 08/29/2015   I advised the patient to continue TSH testing every year with his PCP.  7) Social: No active social issues to address at this time  8) Xerostomia: manageable by sipping water.  10) The patient will see me on a PRN basis and he was encouraged to call with any issues or questions. The patient continues to follow with Dr. Jana Hakim in medical oncology and is scheduled to see him in March 2018. I recommend he follow up with Dr Janace Hoard as well for one more checkup in 51mo.  At that point he will be 5 yrs out from treatment and may followup with his PCP alone (yearly TSH) and see his oncologists as needed.  The neck pain he mentioned is occasional and positional and does not raise concerns for malignancy.  The burning that started within the last 6 months on the outside of his right foot and last few toes is also not a clinical concern.  He knows to contact me or other physicians if these issues worsen.  I wished him the very best and look forward to seeing him at survivorship events at the [facility].  25 minutes spent face to face with patient, over 50% on counseling and care coordination. _____________________________________     Eppie Gibson, MD  This document serves as a record of services personally performed by Eppie Gibson, MD. It was created on her behalf by Darcus Austin, a trained medical scribe. The creation of this record is based on the scribe's personal observations and the provider's statements to them. This document has been checked and approved by the attending provider.

## 2016-05-07 LAB — TSH: TSH: 1.764 m(IU)/L (ref 0.320–4.118)

## 2016-05-10 ENCOUNTER — Telehealth: Payer: Self-pay

## 2016-05-10 NOTE — Telephone Encounter (Signed)
I called and spoke to Steven Robinson and informed him that his Thyroid function looks good. He knows to have his PCP check his TSH level annually. He was appreciative of the phone call and knows to call if he has any further questions.

## 2016-05-12 ENCOUNTER — Telehealth: Payer: Self-pay | Admitting: *Deleted

## 2016-05-12 NOTE — Telephone Encounter (Signed)
Per Dr. Pearlie Oyster guidance, called [facility]NT to arrange follow-up appointment.  Spoke with [facility], requested patient be contacted and routine follow-up with Dr. Janace Hoard in 6 months.   She verbalized understanding.  Gayleen Orem, RN, BSN, [place] [facility] Nurse [place] [facility] [place] [phone]

## 2016-06-30 ENCOUNTER — Other Ambulatory Visit: Payer: Self-pay | Admitting: Nurse Practitioner

## 2016-08-26 ENCOUNTER — Telehealth: Payer: Self-pay | Admitting: Oncology

## 2016-08-26 NOTE — Telephone Encounter (Signed)
Needs to reschedule his appointment on Monday due to work issues and needs it to be on a Monday or a Friday  [phone]

## 2016-08-27 ENCOUNTER — Telehealth: Payer: Self-pay | Admitting: Oncology

## 2016-08-27 NOTE — Telephone Encounter (Signed)
Returned call to patient. They wanted to r/s their lab appt from 3/19 to 3/22. Patient is aware of new date and time.

## 2016-08-30 ENCOUNTER — Other Ambulatory Visit: Payer: Self-pay

## 2016-09-02 ENCOUNTER — Other Ambulatory Visit (HOSPITAL_BASED_OUTPATIENT_CLINIC_OR_DEPARTMENT_OTHER): Payer: 59

## 2016-09-02 DIAGNOSIS — C109 Malignant neoplasm of oropharynx, unspecified: Secondary | ICD-10-CM | POA: Diagnosis not present

## 2016-09-02 DIAGNOSIS — Z79899 Other long term (current) drug therapy: Secondary | ICD-10-CM

## 2016-09-02 LAB — CBC WITH DIFFERENTIAL/PLATELET
BASO%: 0.9 % (ref 0.0–2.0)
Basophils Absolute: 0.1 10*3/uL (ref 0.0–0.1)
EOS%: 2 % (ref 0.0–7.0)
Eosinophils Absolute: 0.1 10*3/uL (ref 0.0–0.5)
HCT: 43.8 % (ref 38.4–49.9)
HGB: 14.9 g/dL (ref 13.0–17.1)
LYMPH%: 22.8 % (ref 14.0–49.0)
MCH: 30.4 pg (ref 27.2–33.4)
MCHC: 34 g/dL (ref 32.0–36.0)
MCV: 89.4 fL (ref 79.3–98.0)
MONO#: 0.6 10*3/uL (ref 0.1–0.9)
MONO%: 10.1 % (ref 0.0–14.0)
NEUT%: 64.2 % (ref 39.0–75.0)
NEUTROS ABS: 3.5 10*3/uL (ref 1.5–6.5)
Platelets: 166 10*3/uL (ref 140–400)
RBC: 4.9 10*6/uL (ref 4.20–5.82)
RDW: 12.5 % (ref 11.0–14.6)
WBC: 5.5 10*3/uL (ref 4.0–10.3)
lymph#: 1.3 10*3/uL (ref 0.9–3.3)

## 2016-09-02 LAB — COMPREHENSIVE METABOLIC PANEL WITH GFR
ALT: 19 U/L (ref 0–55)
AST: 18 U/L (ref 5–34)
Albumin: 4.2 g/dL (ref 3.5–5.0)
Alkaline Phosphatase: 66 U/L (ref 40–150)
Anion Gap: 8 meq/L (ref 3–11)
BUN: 9.5 mg/dL (ref 7.0–26.0)
CO2: 29 meq/L (ref 22–29)
Calcium: 9.4 mg/dL (ref 8.4–10.4)
Chloride: 105 meq/L (ref 98–109)
Creatinine: 0.9 mg/dL (ref 0.7–1.3)
EGFR: 88 ml/min/1.73 m2 — ABNORMAL LOW
Glucose: 84 mg/dL (ref 70–140)
Potassium: 4.3 meq/L (ref 3.5–5.1)
Sodium: 141 meq/L (ref 136–145)
Total Bilirubin: 0.93 mg/dL (ref 0.20–1.20)
Total Protein: 6.6 g/dL (ref 6.4–8.3)

## 2016-09-02 LAB — TSH: TSH: 2.017 m(IU)/L (ref 0.320–4.118)

## 2016-09-06 ENCOUNTER — Encounter: Payer: Self-pay | Admitting: Oncology

## 2016-09-06 ENCOUNTER — Ambulatory Visit (HOSPITAL_BASED_OUTPATIENT_CLINIC_OR_DEPARTMENT_OTHER): Payer: 59 | Admitting: Oncology

## 2016-09-06 VITALS — BP 138/89 | HR 55 | Temp 97.4°F | Resp 17 | Ht 73.0 in | Wt 219.4 lb

## 2016-09-06 DIAGNOSIS — C109 Malignant neoplasm of oropharynx, unspecified: Secondary | ICD-10-CM

## 2016-09-06 DIAGNOSIS — R972 Elevated prostate specific antigen [PSA]: Secondary | ICD-10-CM | POA: Diagnosis not present

## 2016-09-06 DIAGNOSIS — C770 Secondary and unspecified malignant neoplasm of lymph nodes of head, face and neck: Secondary | ICD-10-CM

## 2016-09-06 NOTE — Progress Notes (Signed)
ID: Steven Robinson   DOB: 08/31/1950  MR#: [id number]  AYT#:[id number]  PCP: Velna Hatchet SU: Georganna Skeans MD; Melissa Montane MD OTHER MD: Eppie Gibson, Marcial Pacas, Jovita Gamma, Doreene Nest, Rana Snare, Michaelle Birks, Beth Borden  HISTORY OF HEAD AND NECK CANCER:: From the original intake note:   The patient noted an enlarging left neck mass early March 2013, and brought this to the attention of his PCP, Dr. Charleston Poot. She obtained a neck CT at [facility] 08/20/2011, which showed multiple enlarged nodes in the left mid-neck along the jugular v., the largest 2.0 cm. She referred the patient to Melissa Montane in ENT and FNA of one of the enlarged nodes was positive for squamous cell carcinoma ([id number]). A p16 stain was read as equivocal. PET scan 09/03/2011 showed SUVs >6 in the left oropharynx and left jugular chain nodes; SUV of 5.49 was noted in the larynx. There was no evidence of contralateral or disseminated disease.  This was consistent with a clinical T1 N2,stage IVA left oropharyngeal SCCA. However multiple biopsies under Dr. Janace Hoard 09/09/2011 [[id number]] were all negative. The pathologic staging therefore is TX N2, which impacts radiation planning.  The patient met with Drs. Haskell Riling, and IMRT with concurrent chemosensitization was planned. The patient had dental evaluation under Dr. Enrique Sack, several extractions under Dr. Benson Norway, had audiometry (showing mild high-frequency hearing loss) and met with nutrition. A PEG tube placed by IR unfortunately became dislodged and infected, and had to be replaced through an open procedure under Georganna Skeans. This delayed the start of chemotherapy and caused a change in plan from the original CDDP at 100 mg/m2 Q3w to 40 mg/M2 weekly. Radiation was started 10/04/2011 and chemotherapy 10/25/2011. The patient's subsequent history is as detailed below.   INTERVAL HISTORY: Steven Robinson Returns today for follow-up of his oropharyngeal carcinoma, accompanied by his  wife Steven Robinson. The interval history is generally unremarkable. He works full-time, traveling all over the country as part of his work. He is very busy in the farm, with horses and other animals as well as of course regular I'll keep work. He tells me he eats 10,000 steps most days and more than 60,000 steps most weeks.  He was recently found to have an elevated PSA. He was evaluated by Michaelle Birks in [facility] for this. Prostate biopsies January 2018 were benign. He is continuing to see Dr. Felipa Eth on a every 6 month basis.  Jamone sees his dentist Dr. Araceli Bouche every 6 months. He has not been seeing Dr. Janace Hoard for a couple of years I believe and I would like him to return there for continuing screening. We discussed that today  REVIEW OF SYSTEMS: Izen maintains excellent nutrition, and has actually gained a little bit of weight. He is thinking of cutting back slightly. He would like to be no more than 210 pounds. The mouth is still on the dry side but he does a terrific job of hydrating himself and this is reflected in his EGFR. The lancinating pain in the right side of the neck can still occur with certain positions, but he avoids those so it is now very rare. This was evaluated previously of course with no evidence of cancer noted in the neck I MRI of soft tissues and bone. Despite the high PSA and benign prostatic hypertrophy he has a good urinary stream. He continues to have neuropathy symptoms in his sense of balance is not the best, but those are completely stable and he has taken himself  off Lyrica. A detailed review of systems today was otherwise noncontributory  PAST MEDICAL HISTORY: Past Medical History:  Diagnosis Date  . BPH (benign prostatic hyperplasia)   . Dizziness and giddiness 01/04/2014  . Fracture of fibula 7/14   left  . Gastrostomy tube dependent ([place])    removed Oct. 2013  . GERD (gastroesophageal reflux disease)    chronic  . Hearing loss of left ear   . Herniated  lumbar intervertebral disc    L4-5  . High cholesterol   . History of EKG    done at PCP office, T. SpearsFeb 28, 2012  . Hx of colonic polyps   . Hyperlipidemia   . Hyperlipoproteinemia   . Mucositis 11/01/2011  . Neuromuscular disorder ([place])     Radiation Myelopathy  . Neuropathy ([place])    right upper extremity  . Obstructive sleep apnea    on C-pap.   . Oropharynx cancer ([place]) 08/26/2011  . Pain    right arm and shoulder  . Ptosis of left eyelid   . Ptosis, left eyelid   . Right inguinal hernia   . S/P radiation therapy 10/04/11-11/22/11   Mucosal Axis/Neck:7000cGy/35Fractions  . Status post chemotherapy    CDDP During with Radiation Therapy  . Vertigo    2/2 intersitial neuritis.   . Xerostomia     PAST SURGICAL HISTORY: Past Surgical History:  Procedure Laterality Date  . DIAGNOSTIC LAPAROSCOPY     g-tube replacement   . FINE NEEDLE ASPIRATION  08/26/11    LEFT NECK FNA, LYMPH NODE, METASTATIC SQUAMOUS CELL CARCINOMA  . GASTROSTOMY  09/24/2011   Procedure: GASTROSTOMY;  Surgeon: Zenovia Jarred, MD;  Location: [place];  Service: General;  Laterality: N/A;  open G-tube placement procedure start _0   . HERNIA REPAIR  1990   Left  . INGUINAL HERNIA REPAIR  04/24/2012   Procedure: HERNIA REPAIR INGUINAL ADULT;  Surgeon: Zenovia Jarred, MD;  Location: [place];  Service: General;  Laterality: Right;  . INSERTION OF MESH  04/24/2012   Procedure: INSERTION OF MESH;  Surgeon: Zenovia Jarred, MD;  Location: [place];  Service: General;  Laterality: Right;  . PEG TUBE REMOVAL  03/2012  . Removal Bony Sequestrum Left 09/29/12   Left mandible / Area #17  . RIGID ESOPHAGOSCOPY  09/24/2011   Procedure: RIGID ESOPHAGOSCOPY;  Surgeon: Melissa Montane, MD;  Location: [place];  Service: ENT;  Laterality: N/A;  Esophagoscopy procedure end _1   . VENTRAL HERNIA REPAIR  04/24/2012   Procedure: LAPAROSCOPIC VENTRAL HERNIA;  Surgeon: Zenovia Jarred, MD;  Location: [place];  Service: General;  Laterality:  N/A;  laparoscopic repair incisional hernia with mesh    FAMILY HISTORY Family History  Problem Relation Age of Onset  . Alzheimer's disease Mother   . Tics Mother   . Hypertension Father   . Cancer Father 31    Prostate  . Stroke Paternal Grandmother   . Cancer Paternal Grandfather     Lung  and Prostate   the patient's father is alive at age 40. He lives in a retirement community. The patient's mother died at the age of 45 in 08-11-2009; she had significant Alzheimer's disease. The patient is an only child.  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 His wife Steven Robinson is a Artist in oil. The live on 8 acres and have 4 horses, a donkey, a Clinical cytogeneticist, chickens, etc.  The patient's daughter Steven Robinson lives in [place].. The patient's son Steven Robinson works for an Associate Professor. The patient has 2 grandchildren, granddaughter age 59 and a grandson aged 67 years old, who spent a good tela time with them at the 4. He attends the [employer]  ADVANCED DIRECTIVES: in place  HEALTH MAINTENANCE: Social History  Substance Use Topics  . Smoking status: Former Smoker    Packs/day: 0.25    Years: 3.00    Quit date: 06/14/1972  . Smokeless tobacco: Never Used  . Alcohol use No     Comment: Hx of Drinking wine - Stopped 3 years ago     Allergies  Allergen Reactions  . Flexeril [Cyclobenzaprine] Anxiety    Confusion    Current Outpatient Prescriptions  Medication Sig Dispense Refill  . aspirin EC 81 MG tablet Take 81 mg by mouth daily.    [name] dexlansoprazole (DEXILANT) 60 MG capsule Take 60 mg by mouth at bedtime.     . docusate sodium (COLACE) 100 MG capsule Take 100 mg by mouth daily.     [name] MYRBETRIQ 50 MG TB24 tablet Take 50 mg by mouth daily.     . rosuvastatin (CRESTOR) 20 MG tablet Take 20 mg by mouth daily.    . sodium fluoride (DENTAGEL) 1.1 % GEL dental gel     .  tamsulosin (FLOMAX) 0.4 MG CAPS capsule Take 0.4 mg by mouth daily.    [name] zolpidem (AMBIEN) 10 MG tablet Take 10 mg by mouth at bedtime.      No current facility-administered medications for this visit.     OBJECTIVE: Middle-aged white man Who appears well Vitals:   09/06/16 1327  BP: 138/89  Pulse: (!) 55  Resp: 17  Temp: 97.4 F (36.3 C)     Body mass index is 28.95 kg/m.    ECOG FS: 1 Filed Weights   09/06/16 1327  Weight: 219 lb 6.4 oz (99.5 kg)   Sclerae unicteric, pupils round and equal Oropharynx clear and moist No cervical or supraclavicular adenopathy Lungs no rales or rhonchi Heart regular rate and rhythm Abd soft, nontender, positive bowel sounds MSK no focal spinal tenderness, no upper extremity lymphedema Neuro: nonfocal, well oriented, appropriate affect  LAB RESULTS:  Lab Results  Component Value Date   WBC 5.5 09/02/2016   NEUTROABS 3.5 09/02/2016   HGB 14.9 09/02/2016   HCT 43.8 09/02/2016   MCV 89.4 09/02/2016   PLT 166 09/02/2016      Chemistry      Component Value Date/Time   NA 141 09/02/2016 1336   K 4.3 09/02/2016 1336   CL 104 10/03/2012 0803   CO2 29 09/02/2016 1336   BUN 9.5 09/02/2016 1336   CREATININE 0.9 09/02/2016 1336      Component Value Date/Time   CALCIUM 9.4 09/02/2016 1336   ALKPHOS 66 09/02/2016 1336   AST 18 09/02/2016 1336   ALT 19 09/02/2016 1336   BILITOT 0.93 09/02/2016 1336       STUDIES:  Value Ref Range  Case Report Surgical Pathology Report Case: [id number]  Authorizing Provider:Bradley Loma Newton, MDCollected: 06/21/2016 1110 Ordering [facility]Received:06/21/2016 [place]  Pathologist: Cordelia Pen, MD   Specimens: A) - Prostate, RIGHT-APEX  B) - Prostate, RIGHT-MID   C) - Prostate, RIGHT-BASE  D) - Prostate, RIGHT-LATERAL APEX  E) - Prostate, RIGHT-LATERAL MID   F) - Prostate, RIGHT-LATERAL BASE  G) - Prostate, LEFT-APEX   H) -  Prostate, LEFT-MID  I) - Prostate, LEFT-BASE   J) - Prostate, LEFT-LATERAL APEX   K) - Prostate, LEFT-LATERAL MID  L) - Prostate, LEFT-LATERAL BASE    Final Diagnosis A.PROSTATE, RIGHT APEX, NEEDLE BIOPSY:  BENIGN PROSTATIC TISSUE.  B.PROSTATE, RIGHT MID, NEEDLE BIOPSY:  BENIGN PROSTATIC TISSUE.  C.PROSTATE, RIGHT BASE, NEEDLE BIOPSY:  BENIGN PROSTATIC TISSUE.  D.PROSTATE, RIGHT LATERAL APEX, NEEDLE BIOPSY:  BENIGN PROSTATIC TISSUE.  E.PROSTATE, RIGHT LATERAL MID, NEEDLE BIOPSY:  BENIGN PROSTATIC TISSUE.  F.PROSTATE, RIGHT LATERAL BASE, NEEDLE BIOPSY:  BENIGN PROSTATIC TISSUE.  G.PROSTATE, LEFT APEX, NEEDLE BIOPSY:  BENIGN PROSTATIC TISSUE.   PATCHY MILD INFLAMMATION.  H. PROSTATE, LEFT MID, NEEDLE BIOPSY:  BENIGN PROSTATIC TISSUE.   PATCHY MILD INFLAMMATION.  I.PROSTATE, LEFT BASE, NEEDLE BIOPSY:  BENIGN PROSTATIC  TISSUE.  J.PROSTATE, LEFT LATERAL APEX, NEEDLE BIOPSY:  BENIGN PROSTATIC TISSUE.  K.PROSTATE, LEFT LATERAL MID, NEEDLE BIOPSY:  BENIGN PROSTATIC TISSUE.  L.PROSTATE, LEFT LATERAL BASE, NEEDLE BIOPSY:  BENIGN PROSTATIC TISSUE.  P305 x12      ASSESSMENT: 66 y.o. 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 diagnosed by FNA of a left cervical node March 2013, treated with IMRT completed 11/22/2011 and concurrent weekly cis-platinum, last dose 11/23/2011  (1) risk factors are smoking history (minimal and remote), significant alcohol use (discontinued 2010), and an equivocal p16 probe  (2) dizziness, possibly due to autonomic dysfunction from cis-platinum  (3) malnutrition: PEG removed October 2013, stopped Boost supplements February 2014; maintaining weight orally  (4) dry mouth: discontinued pilocarpine and rinses; drinks extra water during meals  (5) at risk of hypothyroidism: TSH being followed   (6) right hand/arm/shoulder pain: 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; controlled on Lyrica, off narcotics as of March 2014  (7) right inguinal hernia/ventral hernia, repaired 04/24/2012  (8) bony sequestrum area #17, s/p debridement April 2014  (9) mild left ptosis, chronic  (10) status post trauma to the left lower extremity involving laceration and fracture of the fibula, July 2014, resolved  (11) NCCN suggested follow-up: In addition to TSH, needs dental evaluation twice a year, ENT evaluation yearly, continuing monitoring of nutrition and swallowing issues, with imaging as appropriate for signs or symptoms  (12) rising PSA, negative prostate biopsies January 2018   PLAN:   Thimothy is now just about 5 years out from completion of treatment for his squamous cell cancer of the oropharynx, with no evidence of disease recurrence. This is very favorable.  We discussed his rising PSA extensively today and he has close follow-up  through his urologist in [facility]. He knows I will be glad to discuss prostate cancer treatment options in detail with him if he ever develop invasive disease.  At this point I am comfortable seeing him on a once a year basis and the plan will be for a yearly March visit the next 5 years. He is already set up with his dentist every 6 months, his urologist in June, and his primary care physician in December. Accordingly I think the best month for him to see Dr. Janace Hoard in ENT for scoping would be September. I think that will optimize his follow-up plan  [place] or any problems that may develop before his next visit here.  Brittni Hult C    09/06/2016

## 2016-11-24 ENCOUNTER — Telehealth: Payer: Self-pay

## 2016-11-24 NOTE — Telephone Encounter (Signed)
Pt LVM stating he has noticed some swelling "in the back of my neck near my skull where I had radiation".  Spoke with pt by phone and questioned on any possible neurological s/e's.  Pt states none at this time.  Pt advised to be aware of any neurological s/e's such as headache, balance/coordination issues, speech, vision, etc and to get to the nearest ED since pt is currently in [place] and will not return until Friday evening. Pt verbalizes understanding and  also states area of swelling is dry, intact and "just a little sore".  Appt has been scheduled for pt to see Wilber Bihari, NP at 0900 on Mon 11/12/16.  Pt is aware of appt date, time.

## 2016-11-29 ENCOUNTER — Ambulatory Visit: Payer: Self-pay | Admitting: Adult Health

## 2016-11-30 ENCOUNTER — Ambulatory Visit (HOSPITAL_BASED_OUTPATIENT_CLINIC_OR_DEPARTMENT_OTHER): Payer: 59 | Admitting: Adult Health

## 2016-11-30 VITALS — BP 127/80 | HR 66 | Temp 97.5°F | Resp 18 | Ht 73.0 in | Wt 222.6 lb

## 2016-11-30 DIAGNOSIS — R42 Dizziness and giddiness: Secondary | ICD-10-CM

## 2016-11-30 DIAGNOSIS — C770 Secondary and unspecified malignant neoplasm of lymph nodes of head, face and neck: Secondary | ICD-10-CM

## 2016-11-30 DIAGNOSIS — C109 Malignant neoplasm of oropharynx, unspecified: Secondary | ICD-10-CM

## 2016-11-30 NOTE — Progress Notes (Signed)
ID: Steven Robinson   DOB: 1950/12/11  MR#: [id number]  LOV#:[id number]  PCP: Velna Hatchet SU: Georganna Skeans MD; Melissa Montane MD OTHER MD: Eppie Gibson, Marcial Pacas, Jovita Gamma, Doreene Nest, Rana Snare, Michaelle Birks, Beth Borden  HISTORY OF HEAD AND NECK CANCER:: From the original intake note:   The patient noted an enlarging left neck mass early March 2013, and brought this to the attention of his PCP, Dr. Charleston Poot. She obtained a neck CT at [facility] 08/20/2011, which showed multiple enlarged nodes in the left mid-neck along the jugular v., the largest 2.0 cm. She referred the patient to Melissa Montane in ENT and FNA of one of the enlarged nodes was positive for squamous cell carcinoma ([id number]). A p16 stain was read as equivocal. PET scan 09/03/2011 showed SUVs >6 in the left oropharynx and left jugular chain nodes; SUV of 5.49 was noted in the larynx. There was no evidence of contralateral or disseminated disease.  This was consistent with a clinical T1 N2,stage IVA left oropharyngeal SCCA. However multiple biopsies under Dr. Janace Hoard 09/09/2011 [[id number]] were all negative. The pathologic staging therefore is TX N2, which impacts radiation planning.  The patient met with Drs. Haskell Riling, and IMRT with concurrent chemosensitization was planned. The patient had dental evaluation under Dr. Enrique Sack, several extractions under Dr. Benson Norway, had audiometry (showing mild high-frequency hearing loss) and met with nutrition. A PEG tube placed by IR unfortunately became dislodged and infected, and had to be replaced through an open procedure under Georganna Skeans. This delayed the start of chemotherapy and caused a change in plan from the original CDDP at 100 mg/m2 Q3w to 40 mg/M2 weekly. Radiation was started 10/04/2011 and chemotherapy 10/25/2011. The patient's subsequent history is as detailed below.   INTERVAL HISTORY: Steven Robinson Returns today for evaluation for a left posterior neck nodule.  He has h/o  head and neck cancer.  His ENT is Dr. Janace Hoard and he sees him in September.  This nodule popped up about 2 months ago.  He noted tenderness then it resolved, in the last two weeks it recurred and now it is slightly better, but still has an asymmetry that he feels.  He does have a mild vertigo that is more pronounced that usual.    REVIEW OF SYSTEMS: He denies fevers, chills, headaches, vision changes, numbness/tingling, any new pain, dyspahgia or any other concerns.  A detailed ROS is otherwise non contributory.    PAST MEDICAL HISTORY: Past Medical History:  Diagnosis Date  . BPH (benign prostatic hyperplasia)   . Dizziness and giddiness 01/04/2014  . Fracture of fibula 7/14   left  . Gastrostomy tube dependent ([place])    removed Oct. 2013  . GERD (gastroesophageal reflux disease)    chronic  . Hearing loss of left ear   . Herniated lumbar intervertebral disc    L4-5  . High cholesterol   . History of EKG    done at PCP office, T. Spears- 2012  . Hx of colonic polyps   . Hyperlipidemia   . Hyperlipoproteinemia   . Mucositis 11/01/2011  . Neuromuscular disorder ([place])     Radiation Myelopathy  . Neuropathy ([place])    right upper extremity  . Obstructive sleep apnea    on C-pap.   . Oropharynx cancer ([place]) 08/26/2011  . Pain    right arm and shoulder  . Ptosis of left eyelid   . Ptosis, left eyelid   . Right inguinal hernia   . S/P  radiation therapy 10/04/11-11/22/11   Mucosal Axis/Neck:7000cGy/35Fractions  . Status post chemotherapy    CDDP During with Radiation Therapy  . Vertigo    2/2 intersitial neuritis.   . Xerostomia     PAST SURGICAL HISTORY: Past Surgical History:  Procedure Laterality Date  . DIAGNOSTIC LAPAROSCOPY     g-tube replacement   . FINE NEEDLE ASPIRATION  08/26/11    LEFT NECK FNA, LYMPH NODE, METASTATIC SQUAMOUS CELL CARCINOMA  . GASTROSTOMY  09/24/2011   Procedure: GASTROSTOMY;  Surgeon: Zenovia Jarred, MD;  Location: [place];  Service: General;   Laterality: N/A;  open G-tube placement procedure start '@1219'$   . HERNIA REPAIR  1990   Left  . INGUINAL HERNIA REPAIR  04/24/2012   Procedure: HERNIA REPAIR INGUINAL ADULT;  Surgeon: Zenovia Jarred, MD;  Location: [place];  Service: General;  Laterality: Right;  . INSERTION OF MESH  04/24/2012   Procedure: INSERTION OF MESH;  Surgeon: Zenovia Jarred, MD;  Location: [place];  Service: General;  Laterality: Right;  . PEG TUBE REMOVAL  03/2012  . Removal Bony Sequestrum Left 09/29/12   Left mandible / Area #17  . RIGID ESOPHAGOSCOPY  09/24/2011   Procedure: RIGID ESOPHAGOSCOPY;  Surgeon: Melissa Montane, MD;  Location: [place];  Service: ENT;  Laterality: N/A;  Esophagoscopy procedure end '@1210'$   . VENTRAL HERNIA REPAIR  04/24/2012   Procedure: LAPAROSCOPIC VENTRAL HERNIA;  Surgeon: Zenovia Jarred, MD;  Location: [place];  Service: General;  Laterality: N/A;  laparoscopic repair incisional hernia with mesh    FAMILY HISTORY Family History  Problem Relation Age of Onset  . Alzheimer's disease Mother   . Tics Mother   . Hypertension Father   . Cancer Father 78       Prostate  . Stroke Paternal Grandmother   . Cancer Paternal Grandfather        Lung  and Prostate   the patient's father is alive at age 69. He lives in a retirement community. The patient's mother died at the age of 94 in 08-21-09; she had significant Alzheimer's disease. The patient is an only child.  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 His wife Steven Robinson is a Artist in oil. The live on 8 acres and have 4 horses, a donkey, a Clinical cytogeneticist, chickens, etc. The patient's daughter Steven Robinson lives in [place].. The patient's son Steven Robinson works for an Associate Professor. The patient has 2 grandchildren, granddaughter age 82 and a grandson aged 33 years old, who spent a good tela time with them at the 4. He attends the [employer].  ADVANCED DIRECTIVES: in place  HEALTH MAINTENANCE: Social History  Substance Use Topics  . Smoking status: Former Smoker    Packs/day: 0.25    Years: 3.00    Quit date: 06/14/1972  . Smokeless tobacco: Never Used  . Alcohol use No     Comment: Hx of Drinking wine - Stopped 3 years ago     Allergies  Allergen Reactions  . Flexeril [Cyclobenzaprine] Anxiety    Confusion    Current Outpatient Prescriptions  Medication Sig Dispense Refill  . aspirin EC 81 MG tablet Take 81 mg by mouth daily.    [name] dexlansoprazole (DEXILANT) 60 MG capsule Take 60 mg by mouth at bedtime.     . docusate sodium (COLACE) 100 MG capsule Take 100 mg by mouth daily.     [name]  MYRBETRIQ 50 MG TB24 tablet Take 50 mg by mouth daily.     . Probiotic Product (PROBIOTIC DAILY PO) Take by mouth.    . rosuvastatin (CRESTOR) 20 MG tablet Take 20 mg by mouth daily.    . sodium fluoride (DENTAGEL) 1.1 % GEL dental gel     . tamsulosin (FLOMAX) 0.4 MG CAPS capsule Take 0.4 mg by mouth daily.    [name] zolpidem (AMBIEN) 10 MG tablet Take 10 mg by mouth at bedtime.      No current facility-administered medications for this visit.     OBJECTIVE:  Vitals:   11/30/16 1352  BP: 127/80  Pulse: 66  Resp: 18  Temp: 97.5 F (36.4 C)     Body mass index is 29.37 kg/m.    ECOG FS: 1 Filed Weights   11/30/16 1352  Weight: 222 lb 9.6 oz (101 kg)    GENERAL: Patient is a well appearing male in no acute distress HEENT:  Sclerae anicteric.  Oropharynx clear and moist. No ulcerations or evidence of oropharyngeal candidiasis. Neck is supple. I cannot feel any asymmetry or nodule or node NODES:  No cervical, supraclavicular, infraclavicular, or axillary lymphadenopathy palpated.  LUNGS:  Clear to auscultation bilaterally.  No wheezes or rhonchi. HEART:  Regular rate and rhythm. No murmur appreciated. ABDOMEN:  Soft, nontender.  Positive, normoactive bowel sounds. No organomegaly palpated. MSK:  No focal spinal tenderness  to palpation. Full range of motion bilaterally in the upper extremities. EXTREMITIES:  No peripheral edema.   SKIN:  Clear with no obvious rashes or skin changes. No nail dyscrasia. NEURO:  Nonfocal. Well oriented.  Appropriate affect.     LAB RESULTS:  Lab Results  Component Value Date   WBC 5.5 09/02/2016   NEUTROABS 3.5 09/02/2016   HGB 14.9 09/02/2016   HCT 43.8 09/02/2016   MCV 89.4 09/02/2016   PLT 166 09/02/2016      Chemistry      Component Value Date/Time   NA 141 09/02/2016 1336   K 4.3 09/02/2016 1336   CL 104 10/03/2012 0803   CO2 29 09/02/2016 1336   BUN 9.5 09/02/2016 1336   CREATININE 0.9 09/02/2016 1336      Component Value Date/Time   CALCIUM 9.4 09/02/2016 1336   ALKPHOS 66 09/02/2016 1336   AST 18 09/02/2016 1336   ALT 19 09/02/2016 1336   BILITOT 0.93 09/02/2016 1336       STUDIES:  Value Ref Range  Case Report Surgical Pathology Report Case: [id number]  Authorizing Provider:Bradley Loma Newton, MDCollected: 06/21/2016 1110 Ordering [facility]Received:06/21/2016 1522  [address] [address]  Pathologist: Cordelia Pen, MD  Specimens: A) - Prostate, RIGHT-APEX  B) - Prostate, RIGHT-MID   C) - Prostate, RIGHT-BASE  D) - Prostate, RIGHT-LATERAL APEX  E) - Prostate, RIGHT-LATERAL MID   F) - Prostate, RIGHT-LATERAL  BASE  G) - Prostate, LEFT-APEX   H) - Prostate, LEFT-MID  I) - Prostate, LEFT-BASE   J) - Prostate, LEFT-LATERAL APEX   K) - Prostate, LEFT-LATERAL MID  L) - Prostate, LEFT-LATERAL BASE    Final Diagnosis A.PROSTATE, RIGHT APEX, NEEDLE BIOPSY:  BENIGN PROSTATIC TISSUE.  B.PROSTATE, RIGHT MID, NEEDLE BIOPSY:  BENIGN PROSTATIC TISSUE.  C.PROSTATE, RIGHT BASE, NEEDLE BIOPSY:  BENIGN PROSTATIC TISSUE.  D.PROSTATE, RIGHT LATERAL APEX, NEEDLE BIOPSY:  BENIGN PROSTATIC TISSUE.  E.PROSTATE, RIGHT LATERAL MID, NEEDLE BIOPSY:  BENIGN PROSTATIC TISSUE.  F.PROSTATE, RIGHT LATERAL BASE, NEEDLE BIOPSY:  BENIGN PROSTATIC TISSUE.  G.PROSTATE, LEFT  APEX, NEEDLE BIOPSY:  BENIGN PROSTATIC TISSUE.   PATCHY MILD INFLAMMATION.  H. PROSTATE, LEFT MID, NEEDLE BIOPSY:  BENIGN PROSTATIC TISSUE.   PATCHY MILD INFLAMMATION.  I.PROSTATE, LEFT BASE, NEEDLE BIOPSY:  BENIGN PROSTATIC TISSUE.  J.PROSTATE, LEFT LATERAL APEX, NEEDLE BIOPSY:  BENIGN PROSTATIC TISSUE.  K.PROSTATE, LEFT LATERAL MID, NEEDLE BIOPSY:  BENIGN PROSTATIC TISSUE.  L.PROSTATE, LEFT LATERAL BASE, NEEDLE BIOPSY:  BENIGN PROSTATIC TISSUE.  P305 x12      ASSESSMENT: 66 y.o. 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 diagnosed by FNA of a left cervical node March 2013, treated with IMRT completed 11/22/2011 and concurrent weekly cis-platinum, last dose 11/23/2011  (1) risk factors are smoking history (minimal and  remote), significant alcohol use (discontinued 2010), and an equivocal p16 probe  (2) dizziness, possibly due to autonomic dysfunction from cis-platinum  (3) malnutrition: PEG removed October 2013, stopped Boost supplements February 2014; maintaining weight orally  (4) dry mouth: discontinued pilocarpine and rinses; drinks extra water during meals  (5) at risk of hypothyroidism: TSH being followed   (6) right hand/arm/shoulder pain: 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; controlled on Lyrica, off narcotics as of March 2014  (7) right inguinal hernia/ventral hernia, repaired 04/24/2012  (8) bony sequestrum area #17, s/p debridement April 2014  (9) mild left ptosis, chronic  (10) status post trauma to the left lower extremity involving laceration and fracture of the fibula, July 2014, resolved  (11) NCCN suggested follow-up: In addition to TSH, needs dental evaluation twice a year, ENT evaluation yearly, continuing monitoring of nutrition and swallowing issues, with imaging as appropriate for signs or symptoms  (12) rising PSA, negative prostate biopsies January 2018   PLAN:   I do not feel the abnormality that Alvis is describing.  I recommended he call me back and let me know if it pops up again and we will consider scanning at that point.    He verbalized understanding and knows to call between now and his next appt if it happens.  He will f/u with ENT in September as scheduled.    A total of (20) minutes of face-to-face time was spent with this patient with greater than 50% of that time in counseling and care-coordination.   Scot Dock    12/01/2016

## 2016-12-01 ENCOUNTER — Encounter: Payer: Self-pay | Admitting: Adult Health

## 2016-12-11 IMAGING — MR MR CERVICAL SPINE WO/W CM
4 of 8 series · 18 of 48 positions shown · IV contrast (Yes)
Comparison: CT neck performed same date and dictated separately. No
comparison cervical spine MR.

CLINICAL DATA: 64-year-old male with stage IV head and neck
squamous cell carcinoma. Chemotherapy and radiation therapy November 2011. Worsening neck pain. Dysphagia.

EXAM:
MRI CERVICAL SPINE WITHOUT AND WITH CONTRAST
TECHNIQUE: Multiplanar and multiecho pulse sequences of the cervical spine, to
include the craniocervical junction and cervicothoracic junction,
were obtained according to standard protocol without and with
intravenous contrast.
CONTRAST:  20mL MULTIHANCE GADOBENATE DIMEGLUMINE 529 MG/ML IV SOLN

[Series 3: T1 · sagittal · 3.0mm · 0.41mm/px · 3 of 12 slices shown (1 of 2)]
[im 1/12]
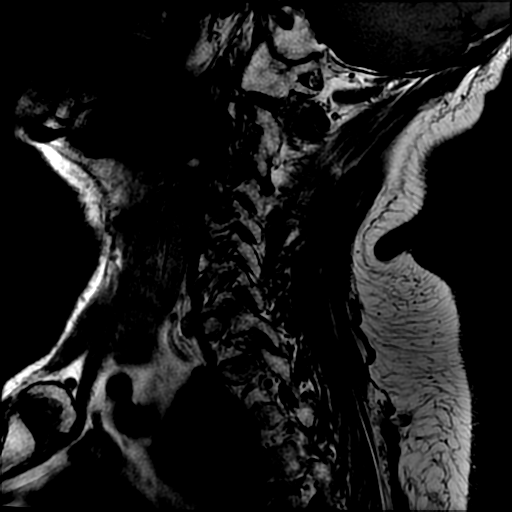
[im 6/12]
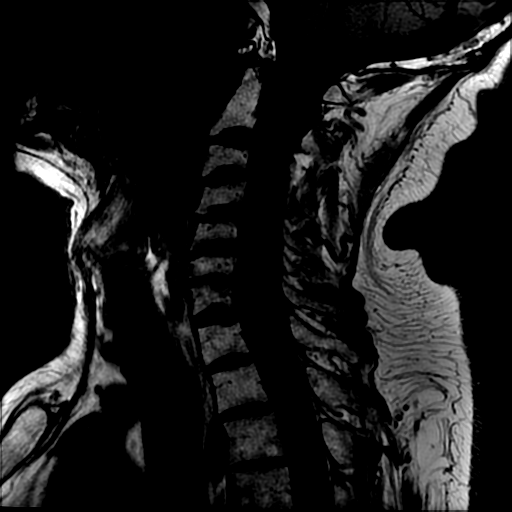
[im 12/12]
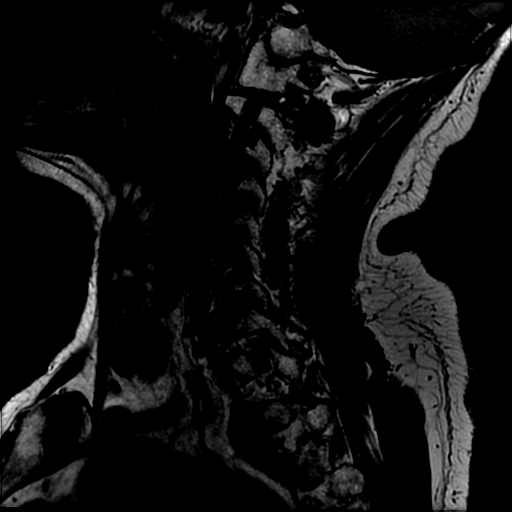

[Series 6: T2 · axial · 3.1mm · 0.35mm/px · z∈[-42,+64]mm · 9 of 32 slices shown]
[im 1/32]
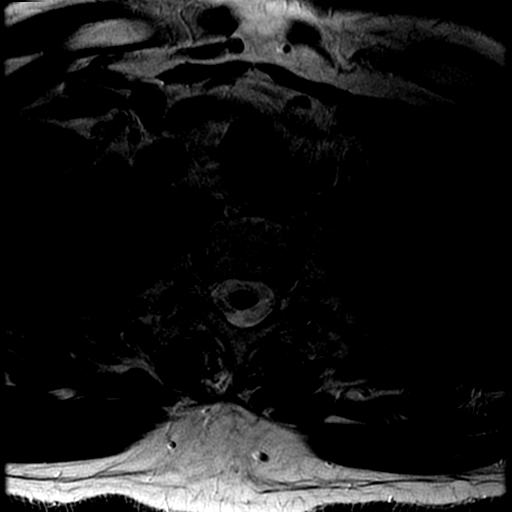
[im 4/32]
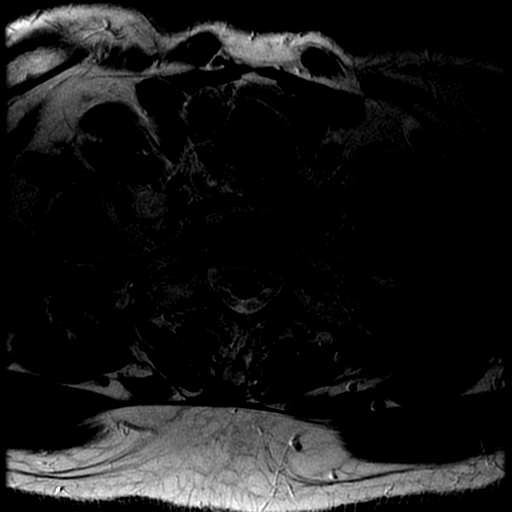
[im 8/32]
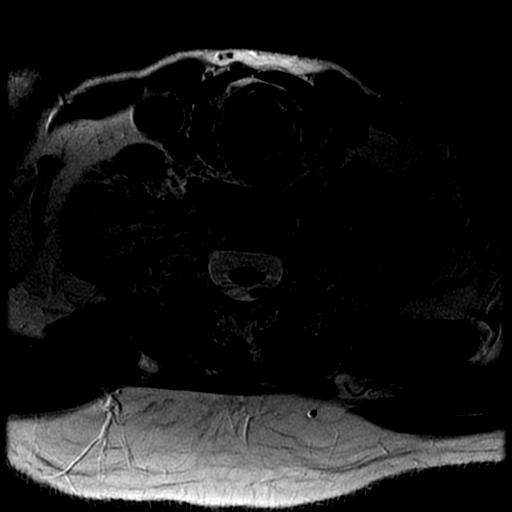
[im 12/32]
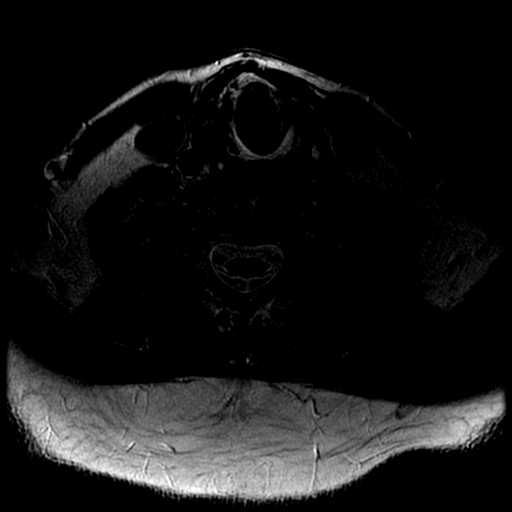
[im 16/32]
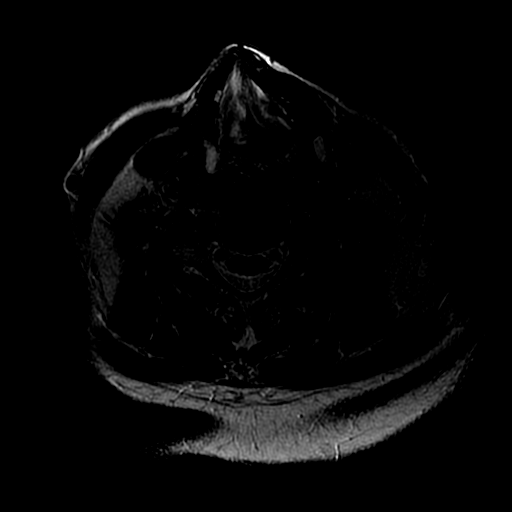
[im 20/32]
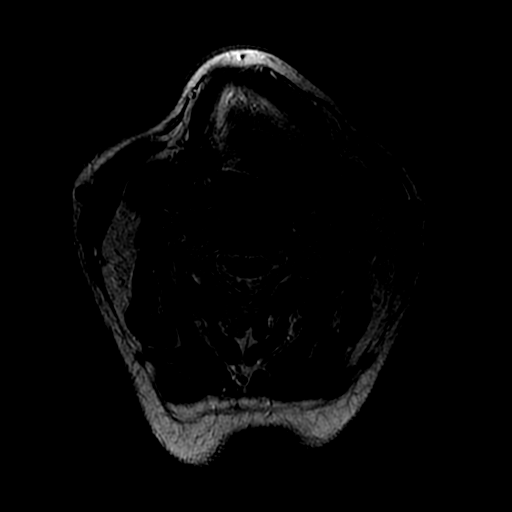
[im 24/32]
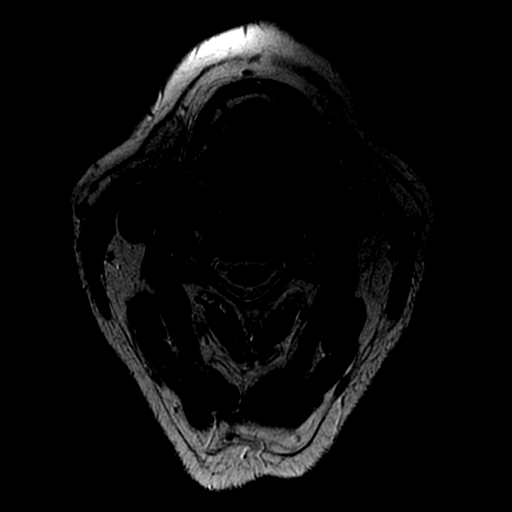
[im 28/32]
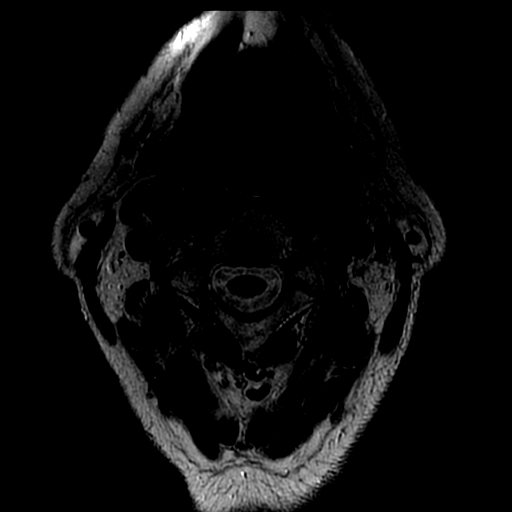
[im 32/32]
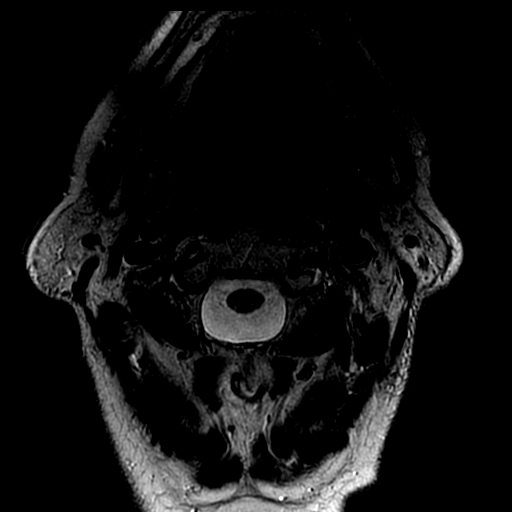

[Series 7: T1 · axial · non-contrast · 3.1mm · 0.35mm/px · z∈[-32,+51]mm · 3 of 32 slices shown (2 of 2)]
[im 4/32]
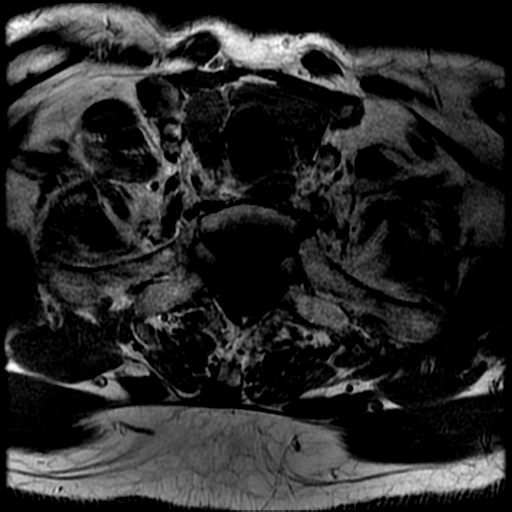
[im 16/32]
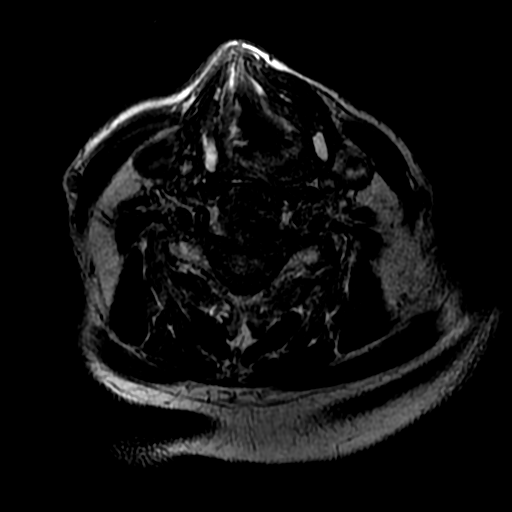
[im 28/32]
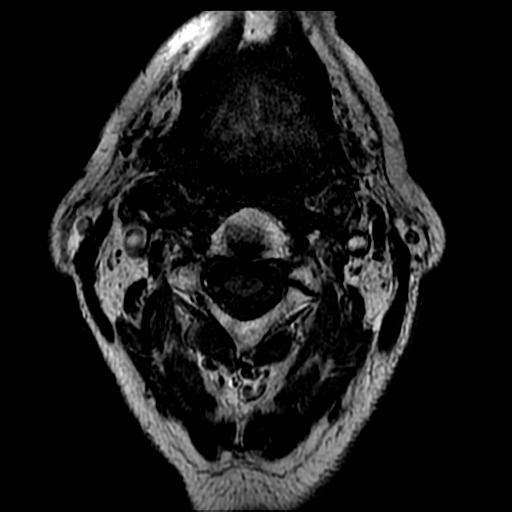

[Series 8: T2 post-contrast · sagittal · 3.0mm · 0.41mm/px · 3 of 12 slices shown]
[im 1/12]
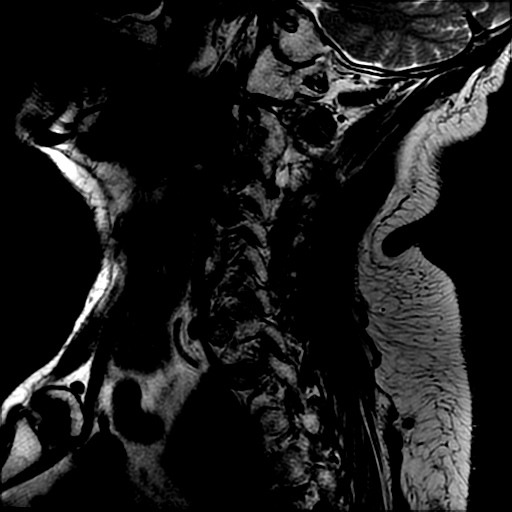
[im 6/12]
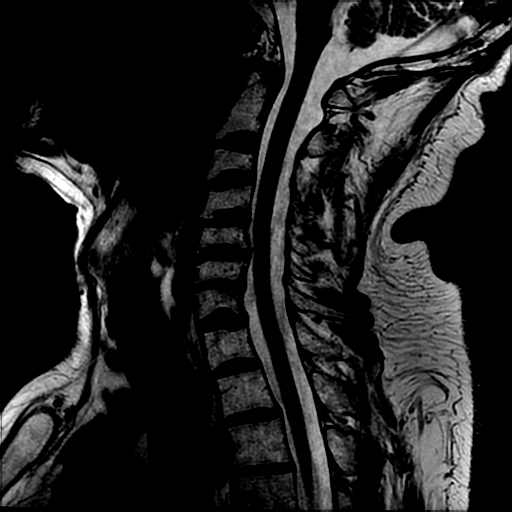
[im 12/12]
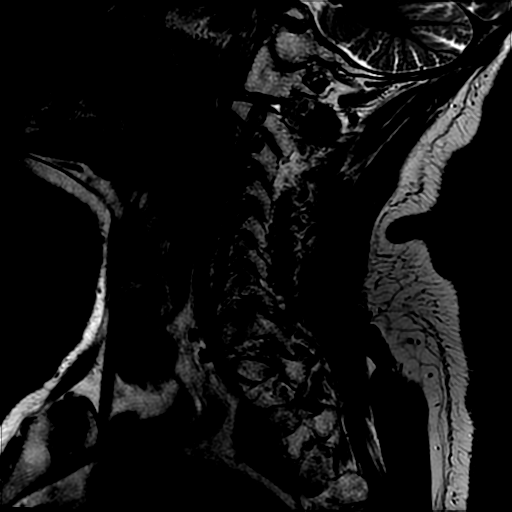

[18 of 48 positions shown; findings below may reference images not displayed]

FINDINGS: Bone marrow changes from prior radiation.

Schmorl's node deformities noted from C2-3 thru C7-T1. Minimal
amount of edema within the superior endplate of the C7 vertebral
body most consistent with mild degenerative changes rather than
metastatic disease.

4 mm rounded area of altered signal intensity and enhancement within
the central aspect of the C7 vertebral body may represent minimal
reactive changes from the Schmorl's node deformity rather than
metastatic disease. There are no other areas of abnormal enhancement
to suggest metastatic disease. If the patient had progressive
symptoms, than this area could be further evaluated on follow-up
imaging to exclude less likely consideration of metastatic disease.

No focal cervical cord signal abnormality or enhancement.

C2-3:  Fusion right facet joint

C3-4: Minimal bulge/spur greater to right. Minimal narrowing ventral
aspect of the thecal sac greater on right. Minimal right foraminal
narrowing.

C4-5: Mild bulge. Slight narrowing ventral aspect of thecal sac.
Right uncinate hypertrophy. Mild right foraminal narrowing.

C5-6: Broad-based disc osteophyte complex greatest centrally. Mild
narrowing ventral aspect of the thecal sac with minimal cord
flattening.

C6-7:  Minimal bulge.

C7-T1: Minimal anterior slips C7. Minimal bulge. Minimal foraminal
narrowing.

T1-2 through T3-4: Minimal bulge.
IMPRESSION: No definitive osseous metastatic disease. 4 mm area of altered
signal intensity within the C7 vertebral body may reflect reactive
changes from adjacent small Schmorl's deformity rather than
metastatic disease. This could be reassessed on follow-up if the
patient had progressive symptoms.

Cervical spondylotic changes most notable C5-6 as detailed above.

## 2016-12-14 ENCOUNTER — Telehealth: Payer: Self-pay | Admitting: *Deleted

## 2016-12-14 NOTE — Telephone Encounter (Signed)
Pt called stating that pt had completed treatment including radiation 5 years ago.  Stated he had neuropathy in both hands; had taken Lyrica before with not much relief.   Now pt experiences burning sensation in feet - right more than left.  Feeling numbness but still able to walk fine.  Denied redness, denied bleeding on bottom of feet.   Pt wanted to know what Dr. Jana Hakim would recommend. Pt's    Phone     [phone].

## 2016-12-16 ENCOUNTER — Other Ambulatory Visit: Payer: Self-pay | Admitting: *Deleted

## 2016-12-22 ENCOUNTER — Telehealth: Payer: Self-pay

## 2016-12-22 NOTE — Telephone Encounter (Signed)
Pt states he prefers to not take gabapentin at this time.  States he has an appt with his internist next week to have blood work done r/t neuropathy symptoms.

## 2017-01-13 ENCOUNTER — Telehealth: Payer: Self-pay | Admitting: *Deleted

## 2017-01-13 NOTE — Telephone Encounter (Signed)
Received voice mail form patient stating that he is having discomfort in his left and has some swelling like a lump in a similar area in his neck where he has had squamous cell cancer. He would like to see Dr. Jana Hakim as soon as possible next week either Thursday or Friday. LOS sent to [facility]

## 2017-01-13 NOTE — Telephone Encounter (Signed)
Will follow up with pt tomorrow and see if we can get him in sooner next week as requested. Thank you.

## 2017-01-14 ENCOUNTER — Telehealth: Payer: Self-pay | Admitting: Oncology

## 2017-01-14 NOTE — Telephone Encounter (Signed)
Scheduled appt per sch message from Bertis Ruddy - MD out on 8/9 and 8/10 patient okay to come in 8/6 - patient aware of appt date and time .

## 2017-01-16 NOTE — Progress Notes (Signed)
ID: Criselda Peaches   DOB: 04/27/1951  MR#: [id number]  BTD#:[id number]  PCP: Velna Hatchet SU: Georganna Skeans MD; Melissa Montane MD OTHER MD: Eppie Gibson, Marcial Pacas, Jovita Gamma, Doreene Nest, Rana Snare, Michaelle Birks, Beth Borden  HISTORY OF HEAD AND NECK CANCER:: From the original intake note:   The patient noted an enlarging left neck mass early March 2013, and brought this to the attention of his PCP, Dr. Charleston Poot. She obtained a neck CT at [facility] 08/20/2011, which showed multiple enlarged nodes in the left mid-neck along the jugular v., the largest 2.0 cm. She referred the patient to Melissa Montane in ENT and FNA of one of the enlarged nodes was positive for squamous cell carcinoma ([id number]). A p16 stain was read as equivocal. PET scan 09/03/2011 showed SUVs >6 in the left oropharynx and left jugular chain nodes; SUV of 5.49 was noted in the larynx. There was no evidence of contralateral or disseminated disease.  This was consistent with a clinical T1 N2,stage IVA left oropharyngeal SCCA. However multiple biopsies under Dr. Janace Hoard 09/09/2011 [[id number]] were all negative. The pathologic staging therefore is TX N2, which impacts radiation planning.  The patient met with Drs. Haskell Riling, and IMRT with concurrent chemosensitization was planned. The patient had dental evaluation under Dr. Enrique Sack, several extractions under Dr. Benson Norway, had audiometry (showing mild high-frequency hearing loss) and met with nutrition. A PEG tube placed by IR unfortunately became dislodged and infected, and had to be replaced through an open procedure under Georganna Skeans. This delayed the start of chemotherapy and caused a change in plan from the original CDDP at 100 mg/m2 Q3w to 40 mg/M2 weekly. Radiation was started 10/04/2011 and chemotherapy 10/25/2011. The patient's subsequent history is as detailed below.   INTERVAL HISTORY: Shondale Returns today for follow-up of his head and neck cancer accompanied by his  wife and his 2 grandchildren. The interval history is generally unremarkable except that he saw [place] a few weeks back with concerns regarding a left neck mass. He was seen by my nurse practitioner as I was out of town. She was unable to palpate any significant finding in the left neck and no further imaging was done.  He continues to feel a change in the left neck area. It is not constant, sometimes it is more pronounced ceases, but it is never completely absent.   REVIEW OF SYSTEMS: Quite aside from the neck problem, Marquis has had a steadily rising PSA. He had a negative biopsy January of this year. He is going to have an MRI guided biopsy tomorrow morning under Dr. Felipa Eth. Aside from that he has a good stream. He will exercises chiefly by walking and gets about 10,000 steps most days. 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 He feels tired at times. He has a slight cough, likely due to reflux. Aside from that a detailed review of systems today was stable   PAST MEDICAL HISTORY: Past Medical History:  Diagnosis Date  . BPH (benign prostatic hyperplasia)   . Dizziness and giddiness 01/04/2014  . Fracture of fibula 7/14   left  . Gastrostomy tube dependent ([place])    removed Oct. 2013  . GERD (gastroesophageal reflux disease)    chronic  . Hearing loss of left ear   . Herniated lumbar intervertebral disc    L4-5  . High cholesterol   . History of EKG    done at PCP office, T. Spears- 2012  . Hx of colonic polyps   .  Hyperlipidemia   . Hyperlipoproteinemia   . Mucositis 11/01/2011  . Neuromuscular disorder ([place])     Radiation Myelopathy  . Neuropathy    right upper extremity  . Obstructive sleep apnea    on C-pap.   . Oropharynx cancer ([place]) 08/26/2011  . Pain    right arm and shoulder  . Ptosis of left eyelid   . Ptosis, left eyelid   . Right inguinal hernia   . S/P radiation therapy 10/04/11-11/22/11   Mucosal Axis/Neck:7000cGy/35Fractions  . Status post chemotherapy    CDDP During with  Radiation Therapy  . Vertigo    2/2 intersitial neuritis.   . Xerostomia     PAST SURGICAL HISTORY: Past Surgical History:  Procedure Laterality Date  . DIAGNOSTIC LAPAROSCOPY     g-tube replacement   . FINE NEEDLE ASPIRATION  08/26/11    LEFT NECK FNA, LYMPH NODE, METASTATIC SQUAMOUS CELL CARCINOMA  . GASTROSTOMY  09/24/2011   Procedure: GASTROSTOMY;  Surgeon: Zenovia Jarred, MD;  Location: [place];  Service: General;  Laterality: N/A;  open G-tube placement procedure start '@1219'$   . HERNIA REPAIR  1990   Left  . INGUINAL HERNIA REPAIR  04/24/2012   Procedure: HERNIA REPAIR INGUINAL ADULT;  Surgeon: Zenovia Jarred, MD;  Location: [place];  Service: General;  Laterality: Right;  . INSERTION OF MESH  04/24/2012   Procedure: INSERTION OF MESH;  Surgeon: Zenovia Jarred, MD;  Location: [place];  Service: General;  Laterality: Right;  . PEG TUBE REMOVAL  03/2012  . Removal Bony Sequestrum Left 09/29/12   Left mandible / Area #17  . RIGID ESOPHAGOSCOPY  09/24/2011   Procedure: RIGID ESOPHAGOSCOPY;  Surgeon: Melissa Montane, MD;  Location: [place];  Service: ENT;  Laterality: N/A;  Esophagoscopy procedure end '@1210'$   . VENTRAL HERNIA REPAIR  04/24/2012   Procedure: LAPAROSCOPIC VENTRAL HERNIA;  Surgeon: Zenovia Jarred, MD;  Location: [place];  Service: General;  Laterality: N/A;  laparoscopic repair incisional hernia with mesh    FAMILY HISTORY Family History  Problem Relation Age of Onset  . Alzheimer's disease Mother   . Tics Mother   . Hypertension Father   . Cancer Father 63       Prostate  . Stroke Paternal Grandmother   . Cancer Paternal Grandfather        Lung  and Prostate   the patient's father is alive at age 67. He lives in a retirement community. The patient's mother died at the age of 59 in 22-Aug-2009; she had significant Alzheimer's disease. The patient is an only child.  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 His wife Shauna Hugh is a Artist in oil. The live on 8 acres and have 4 horses, a donkey, a Clinical cytogeneticist, chickens, etc. The patient's daughter Caryl Pina lives in [place].. The patient's son Yong Channel works for an Associate Professor. The patient has 2 grandchildren, granddaughter age 53 and a grandson aged 16 years old, who spent a good tela time with them at the 4. He attends the [employer]  ADVANCED DIRECTIVES: in place  HEALTH MAINTENANCE: Social History  Substance Use Topics  . Smoking status: Former Smoker    Packs/day: 0.25    Years: 3.00    Quit date: 06/14/1972  . Smokeless tobacco: Never Used  . Alcohol use No     Comment: Hx of Drinking wine - Stopped 3  years ago     Allergies  Allergen Reactions  . Flexeril [Cyclobenzaprine] Anxiety    Confusion    Current Outpatient Prescriptions  Medication Sig Dispense Refill  . aspirin EC 81 MG tablet Take 81 mg by mouth daily.    [name] dexlansoprazole (DEXILANT) 60 MG capsule Take 60 mg by mouth at bedtime.     . docusate sodium (COLACE) 100 MG capsule Take 100 mg by mouth daily.     [name] MYRBETRIQ 50 MG TB24 tablet Take 50 mg by mouth daily.     . Probiotic Product (PROBIOTIC DAILY PO) Take by mouth.    . rosuvastatin (CRESTOR) 20 MG tablet Take 20 mg by mouth daily.    . sodium fluoride (DENTAGEL) 1.1 % GEL dental gel     . tamsulosin (FLOMAX) 0.4 MG CAPS capsule Take 0.4 mg by mouth daily.    [name] zolpidem (AMBIEN) 10 MG tablet Take 10 mg by mouth at bedtime.      No current facility-administered medications for this visit.     OBJECTIVE: Middle-aged white man who appears well  Vitals:   01/17/17 1539  BP: 116/71  Pulse: (!) 57  Resp: 18  Temp: (!) 97.4 F (36.3 C)     Body mass index is 29.71 kg/m.    ECOG FS: 0 Filed Weights   01/17/17 1539  Weight: 225 lb 3.2 oz (102.2 kg)    Sclerae unicteric, EOMs intact Oropharynx clear and moist No palpable cervical or  supraclavicular adenopathy despite special attention to the left neck area in question Lungs no rales or rhonchi Heart regular rate and rhythm Abd soft, nontender, positive bowel sounds MSK no focal spinal tenderness, no upper extremity lymphedema Neuro: nonfocal, well oriented, appropriate affect     LAB RESULTS:  Lab Results  Component Value Date   WBC 5.5 09/02/2016   NEUTROABS 3.5 09/02/2016   HGB 14.9 09/02/2016   HCT 43.8 09/02/2016   MCV 89.4 09/02/2016   PLT 166 09/02/2016      Chemistry      Component Value Date/Time   NA 141 09/02/2016 1336   K 4.3 09/02/2016 1336   CL 104 10/03/2012 0803   CO2 29 09/02/2016 1336   BUN 9.5 09/02/2016 1336   CREATININE 0.9 09/02/2016 1336      Component Value Date/Time   CALCIUM 9.4 09/02/2016 1336   ALKPHOS 66 09/02/2016 1336   AST 18 09/02/2016 1336   ALT 19 09/02/2016 1336   BILITOT 0.93 09/02/2016 1336       STUDIES: Most recent neck imaging was an MRI of the cervical spine 03/14/2015, which showed no evidence of recurrent disease and serves as her current baseline  ASSESSMENT: 66 y.o. 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 diagnosed by FNA of a left cervical node March 2013, treated with IMRT completed 11/22/2011 and concurrent weekly cis-platinum, last dose 11/23/2011  (1) risk factors are smoking history (minimal and remote), significant alcohol use (discontinued 2010), and an equivocal p16 probe  (2) dizziness, possibly due to autonomic dysfunction from cis-platinum  (3) malnutrition: PEG removed October 2013, stopped Boost supplements February 2014; maintaining weight orally  (4) dry mouth: discontinued pilocarpine and rinses; drinks extra water during meals  (5) at risk of hypothyroidism: TSH being followed   (6) right hand/arm/shoulder pain: 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; controlled on Lyrica, off narcotics as of March 2014  (7) right inguinal  hernia/ventral hernia, repaired 04/24/2012  (  8) bony sequestrum area #17, s/p debridement April 2014  (9) mild left ptosis, chronic  (10) status post trauma to the left lower extremity involving laceration and fracture of the fibula, July 2014, resolved  (11) NCCN suggested follow-up: In addition to TSH, needs dental evaluation twice a year, ENT evaluation yearly, continuing monitoring of nutrition and swallowing issues, with imaging as appropriate for signs or symptoms  (12) rising PSA, negative prostate biopsies January 2018   PLAN:   Kaydence is now a little over 5 years out from completion of therapy for his head and neck cancer, with no evidence of disease recurrence. This is very favorable.  Nevertheless she remains concerned about a change he has noted in his left neck. I do not palpate a well-defined mass or significant induration in that area although it is not entirely normal of course given his prior treatment.  He already has an appointment with Dr. Janace Hoard tomorrow and can be scoped at that time as appropriate. I think it would be prudent also to obtain an MRI of the neck which will serve as a new baseline as well.  I am checking his TSH on a yearly basis. It might be prudent for his primary care physician to check it in 6 months as well as he is at high risk for thyroid failure given his radiation history  Hopefully his biopsy tomorrow will prove benign. If it does not she will let me know if he wishes to discuss results further.  Assuming the upcoming tests are benign, I will see him again in one year. He knows to call for any problems that may develop before then.   Natika Geyer C    01/17/2017

## 2017-01-17 ENCOUNTER — Ambulatory Visit (HOSPITAL_BASED_OUTPATIENT_CLINIC_OR_DEPARTMENT_OTHER): Payer: 59 | Admitting: Oncology

## 2017-01-17 ENCOUNTER — Encounter (INDEPENDENT_AMBULATORY_CARE_PROVIDER_SITE_OTHER): Payer: Self-pay

## 2017-01-17 VITALS — BP 116/71 | HR 57 | Temp 97.4°F | Resp 18 | Ht 73.0 in | Wt 225.2 lb

## 2017-01-17 DIAGNOSIS — C770 Secondary and unspecified malignant neoplasm of lymph nodes of head, face and neck: Secondary | ICD-10-CM | POA: Diagnosis not present

## 2017-01-17 DIAGNOSIS — R05 Cough: Secondary | ICD-10-CM | POA: Diagnosis not present

## 2017-01-17 DIAGNOSIS — C109 Malignant neoplasm of oropharynx, unspecified: Secondary | ICD-10-CM | POA: Diagnosis not present

## 2017-01-17 DIAGNOSIS — R972 Elevated prostate specific antigen [PSA]: Secondary | ICD-10-CM

## 2017-01-17 DIAGNOSIS — R221 Localized swelling, mass and lump, neck: Secondary | ICD-10-CM | POA: Diagnosis not present

## 2017-01-27 ENCOUNTER — Other Ambulatory Visit: Payer: Self-pay | Admitting: Otolaryngology

## 2017-01-27 DIAGNOSIS — R221 Localized swelling, mass and lump, neck: Secondary | ICD-10-CM

## 2017-02-03 ENCOUNTER — Ambulatory Visit
Admission: RE | Admit: 2017-02-03 | Discharge: 2017-02-03 | Disposition: A | Discharge status: Home / Self Care | Payer: 59 | Source: Ambulatory Visit | Attending: Otolaryngology | Admitting: Otolaryngology

## 2017-02-03 ENCOUNTER — Other Ambulatory Visit: Payer: Self-pay

## 2017-02-03 DIAGNOSIS — R221 Localized swelling, mass and lump, neck: Secondary | ICD-10-CM

## 2017-02-03 MED ORDER — IOPAMIDOL (ISOVUE-300) INJECTION 61%
75.0000 mL | Freq: Once | INTRAVENOUS | Status: AC | PRN
  Administered 2017-02-03: 75 mL via INTRAVENOUS

## 2017-02-16 ENCOUNTER — Other Ambulatory Visit: Payer: Self-pay

## 2017-04-07 ENCOUNTER — Other Ambulatory Visit (HOSPITAL_COMMUNITY): Payer: Self-pay | Admitting: Urology

## 2017-04-07 DIAGNOSIS — R972 Elevated prostate specific antigen [PSA]: Secondary | ICD-10-CM

## 2017-04-25 ENCOUNTER — Ambulatory Visit (HOSPITAL_COMMUNITY)
Admission: RE | Admit: 2017-04-25 | Discharge: 2017-04-25 | Disposition: A | Discharge status: Home / Self Care | Payer: 59 | Source: Ambulatory Visit | Attending: Urology | Admitting: Urology

## 2017-04-25 DIAGNOSIS — R972 Elevated prostate specific antigen [PSA]: Secondary | ICD-10-CM | POA: Diagnosis present

## 2017-04-25 LAB — CREATININE, SERUM: Creatinine, Ser: 0.97 mg/dL (ref 0.61–1.24)

## 2017-04-25 MED ORDER — GADOBENATE DIMEGLUMINE 529 MG/ML IV SOLN
20.0000 mL | Freq: Once | INTRAVENOUS | Status: AC | PRN
  Administered 2017-04-25: 20 mL via INTRAVENOUS

## 2017-08-29 ENCOUNTER — Inpatient Hospital Stay: Payer: 59 | Attending: Oncology

## 2017-08-29 DIAGNOSIS — C61 Malignant neoplasm of prostate: Secondary | ICD-10-CM | POA: Diagnosis not present

## 2017-08-29 DIAGNOSIS — C770 Secondary and unspecified malignant neoplasm of lymph nodes of head, face and neck: Secondary | ICD-10-CM

## 2017-08-29 DIAGNOSIS — Z8589 Personal history of malignant neoplasm of other organs and systems: Secondary | ICD-10-CM | POA: Diagnosis not present

## 2017-08-29 DIAGNOSIS — C109 Malignant neoplasm of oropharynx, unspecified: Secondary | ICD-10-CM

## 2017-08-29 LAB — CBC WITH DIFFERENTIAL/PLATELET
BASOS ABS: 0 10*3/uL (ref 0.0–0.1)
Basophils Relative: 0 %
Eosinophils Absolute: 0.1 10*3/uL (ref 0.0–0.5)
Eosinophils Relative: 2 %
HEMATOCRIT: 42.9 % (ref 38.4–49.9)
HEMOGLOBIN: 14.9 g/dL (ref 13.0–17.1)
LYMPHS ABS: 1.2 10*3/uL (ref 0.9–3.3)
LYMPHS PCT: 24 %
MCH: 30.7 pg (ref 27.2–33.4)
MCHC: 34.7 g/dL (ref 32.0–36.0)
MCV: 88.5 fL (ref 79.3–98.0)
Monocytes Absolute: 0.5 10*3/uL (ref 0.1–0.9)
Monocytes Relative: 11 %
NEUTROS ABS: 3 10*3/uL (ref 1.5–6.5)
Neutrophils Relative %: 63 %
Platelets: 158 10*3/uL (ref 140–400)
RBC: 4.85 MIL/uL (ref 4.20–5.82)
RDW: 12.5 % (ref 11.0–14.6)
WBC: 4.8 10*3/uL (ref 4.0–10.3)

## 2017-08-29 LAB — COMPREHENSIVE METABOLIC PANEL
ALT: 16 U/L (ref 0–55)
ANION GAP: 8 (ref 3–11)
AST: 19 U/L (ref 5–34)
Albumin: 4.1 g/dL (ref 3.5–5.0)
Alkaline Phosphatase: 64 U/L (ref 40–150)
BUN: 9 mg/dL (ref 7–26)
CHLORIDE: 107 mmol/L (ref 98–109)
CO2: 25 mmol/L (ref 22–29)
Calcium: 9.1 mg/dL (ref 8.4–10.4)
Creatinine, Ser: 0.83 mg/dL (ref 0.70–1.30)
GFR calc Af Amer: >60 mL/min (ref >60)
Glucose, Bld: 92 mg/dL (ref 70–140)
POTASSIUM: 3.9 mmol/L (ref 3.5–5.1)
SODIUM: 140 mmol/L (ref 136–145)
Total Bilirubin: 0.7 mg/dL (ref 0.2–1.2)
Total Protein: 6.5 g/dL (ref 6.4–8.3)

## 2017-08-29 LAB — TSH: TSH: 2.402 u[IU]/mL (ref 0.320–4.118)
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
## 2017-09-05 ENCOUNTER — Telehealth: Payer: Self-pay | Admitting: Oncology

## 2017-09-05 ENCOUNTER — Inpatient Hospital Stay: Payer: 59 | Admitting: Oncology

## 2017-09-05 VITALS — BP 134/79 | HR 59 | Temp 97.6°F | Resp 20 | Ht 73.0 in | Wt 219.8 lb

## 2017-09-05 DIAGNOSIS — G959 Disease of spinal cord, unspecified: Secondary | ICD-10-CM

## 2017-09-05 DIAGNOSIS — C770 Secondary and unspecified malignant neoplasm of lymph nodes of head, face and neck: Secondary | ICD-10-CM

## 2017-09-05 DIAGNOSIS — Z8589 Personal history of malignant neoplasm of other organs and systems: Secondary | ICD-10-CM

## 2017-09-05 DIAGNOSIS — C61 Malignant neoplasm of prostate: Secondary | ICD-10-CM | POA: Diagnosis not present

## 2017-09-05 DIAGNOSIS — C109 Malignant neoplasm of oropharynx, unspecified: Secondary | ICD-10-CM

## 2017-09-05 NOTE — Telephone Encounter (Signed)
Gave patient calendar with appts per 3/25 los.

## 2018-04-12 ENCOUNTER — Other Ambulatory Visit (HOSPITAL_COMMUNITY): Payer: Self-pay | Admitting: Internal Medicine

## 2018-04-12 DIAGNOSIS — R1011 Right upper quadrant pain: Secondary | ICD-10-CM

## 2018-04-18 ENCOUNTER — Encounter (HOSPITAL_COMMUNITY)
Admission: RE | Admit: 2018-04-18 | Discharge: 2018-04-18 | Disposition: A | Discharge status: Home / Self Care | Payer: 59 | Source: Ambulatory Visit | Attending: Internal Medicine | Admitting: Internal Medicine

## 2018-04-18 DIAGNOSIS — R1011 Right upper quadrant pain: Secondary | ICD-10-CM | POA: Diagnosis not present

## 2018-04-18 MED ORDER — TECHNETIUM TC 99M SULFUR COLLOID
5.3000 | Freq: Once | INTRAVENOUS | Status: AC | PRN
  Administered 2018-04-18: 5.3 via INTRAVENOUS

## 2018-06-05 ENCOUNTER — Ambulatory Visit
Admission: RE | Admit: 2018-06-05 | Discharge: 2018-06-05 | Disposition: A | Discharge status: Home / Self Care | Payer: 59 | Source: Ambulatory Visit | Attending: Internal Medicine | Admitting: Internal Medicine

## 2018-06-05 ENCOUNTER — Other Ambulatory Visit: Payer: Self-pay | Admitting: Internal Medicine

## 2018-06-05 DIAGNOSIS — R079 Chest pain, unspecified: Secondary | ICD-10-CM

## 2018-06-05 DIAGNOSIS — R0600 Dyspnea, unspecified: Secondary | ICD-10-CM

## 2018-06-05 MED ORDER — IOHEXOL 300 MG/ML  SOLN
75.0000 mL | Freq: Once | INTRAMUSCULAR | Status: AC | PRN
  Administered 2018-06-05: 75 mL via INTRAVENOUS

## 2018-07-10 ENCOUNTER — Other Ambulatory Visit: Payer: Self-pay | Admitting: Oncology

## 2018-07-10 NOTE — Progress Notes (Signed)
I called case to make sure he was getting better with the treatment he received from Dr. Carlean Jews.  He does say he is better.  They are continuing with a cardiac work-up under Dr. Einar Gip.  Given the negative CT of the chest I think possibly what we are dealing with scar tissue from his prior tube.  He sees me in March.  He knows to call for any other issues that may develop before then.

## 2018-09-04 ENCOUNTER — Inpatient Hospital Stay: Payer: 59 | Attending: Oncology

## 2018-09-04 ENCOUNTER — Other Ambulatory Visit: Payer: Self-pay

## 2018-09-04 DIAGNOSIS — Z8589 Personal history of malignant neoplasm of other organs and systems: Secondary | ICD-10-CM | POA: Diagnosis not present

## 2018-09-04 DIAGNOSIS — C61 Malignant neoplasm of prostate: Secondary | ICD-10-CM | POA: Diagnosis not present

## 2018-09-04 DIAGNOSIS — Z79899 Other long term (current) drug therapy: Secondary | ICD-10-CM | POA: Diagnosis not present

## 2018-09-04 DIAGNOSIS — C109 Malignant neoplasm of oropharynx, unspecified: Secondary | ICD-10-CM

## 2018-09-04 DIAGNOSIS — C770 Secondary and unspecified malignant neoplasm of lymph nodes of head, face and neck: Secondary | ICD-10-CM

## 2018-09-04 LAB — COMPREHENSIVE METABOLIC PANEL
ALT: 19 U/L (ref 0–44)
AST: 19 U/L (ref 15–41)
Albumin: 4.1 g/dL (ref 3.5–5.0)
Alkaline Phosphatase: 56 U/L (ref 38–126)
Anion gap: 12 (ref 5–15)
BUN: 8 mg/dL (ref 8–23)
CO2: 22 mmol/L (ref 22–32)
Calcium: 9 mg/dL (ref 8.9–10.3)
Chloride: 107 mmol/L (ref 98–111)
Creatinine, Ser: 0.94 mg/dL (ref 0.61–1.24)
GFR calc Af Amer: >60 mL/min (ref >60)
Glucose, Bld: 119 mg/dL — ABNORMAL HIGH (ref 70–99)
POTASSIUM: 3.9 mmol/L (ref 3.5–5.1)
Sodium: 141 mmol/L (ref 135–145)
TOTAL PROTEIN: 6.6 g/dL (ref 6.5–8.1)
Total Bilirubin: 0.9 mg/dL (ref 0.3–1.2)

## 2018-09-04 LAB — CBC WITH DIFFERENTIAL/PLATELET
Abs Immature Granulocytes: 0.01 10*3/uL (ref 0.00–0.07)
Basophils Absolute: 0.1 10*3/uL (ref 0.0–0.1)
Basophils Relative: 1 %
EOS ABS: 0.1 10*3/uL (ref 0.0–0.5)
EOS PCT: 3 %
HEMATOCRIT: 45 % (ref 39.0–52.0)
Hemoglobin: 15.3 g/dL (ref 13.0–17.0)
IMMATURE GRANULOCYTES: 0 %
LYMPHS ABS: 1.1 10*3/uL (ref 0.7–4.0)
Lymphocytes Relative: 24 %
MCH: 30.5 pg (ref 26.0–34.0)
MCHC: 34 g/dL (ref 30.0–36.0)
MCV: 89.8 fL (ref 80.0–100.0)
Monocytes Absolute: 0.5 10*3/uL (ref 0.1–1.0)
Monocytes Relative: 11 %
NEUTROS PCT: 61 %
Neutro Abs: 2.9 10*3/uL (ref 1.7–7.7)
Platelets: 165 10*3/uL (ref 150–400)
RBC: 5.01 MIL/uL (ref 4.22–5.81)
RDW: 12.1 % (ref 11.5–15.5)
WBC: 4.7 10*3/uL (ref 4.0–10.5)
nRBC: 0 % (ref 0.0–0.2)

## 2018-09-04 LAB — TSH: TSH: 2.472 u[IU]/mL (ref 0.320–4.118)

## 2018-09-11 ENCOUNTER — Ambulatory Visit: Payer: Self-pay | Admitting: Oncology

## 2018-12-04 ENCOUNTER — Other Ambulatory Visit: Payer: Self-pay

## 2018-12-04 ENCOUNTER — Inpatient Hospital Stay: Payer: 59 | Attending: Oncology

## 2018-12-04 ENCOUNTER — Other Ambulatory Visit: Payer: Self-pay | Admitting: *Deleted

## 2018-12-04 DIAGNOSIS — Z87891 Personal history of nicotine dependence: Secondary | ICD-10-CM | POA: Diagnosis not present

## 2018-12-04 DIAGNOSIS — C109 Malignant neoplasm of oropharynx, unspecified: Secondary | ICD-10-CM

## 2018-12-04 DIAGNOSIS — C770 Secondary and unspecified malignant neoplasm of lymph nodes of head, face and neck: Secondary | ICD-10-CM

## 2018-12-04 DIAGNOSIS — Z8589 Personal history of malignant neoplasm of other organs and systems: Secondary | ICD-10-CM | POA: Insufficient documentation

## 2018-12-04 LAB — CBC WITH DIFFERENTIAL (CANCER CENTER ONLY)
Abs Immature Granulocytes: 0.02 10*3/uL (ref 0.00–0.07)
Basophils Absolute: 0 10*3/uL (ref 0.0–0.1)
Basophils Relative: 1 %
Eosinophils Absolute: 0.1 10*3/uL (ref 0.0–0.5)
Eosinophils Relative: 3 %
HCT: 44.8 % (ref 39.0–52.0)
Hemoglobin: 14.8 g/dL (ref 13.0–17.0)
Immature Granulocytes: 0 %
Lymphocytes Relative: 24 %
Lymphs Abs: 1.2 10*3/uL (ref 0.7–4.0)
MCH: 30.1 pg (ref 26.0–34.0)
MCHC: 33 g/dL (ref 30.0–36.0)
MCV: 91.2 fL (ref 80.0–100.0)
Monocytes Absolute: 0.6 10*3/uL (ref 0.1–1.0)
Monocytes Relative: 12 %
Neutro Abs: 3 10*3/uL (ref 1.7–7.7)
Neutrophils Relative %: 60 %
Platelet Count: 169 10*3/uL (ref 150–400)
RBC: 4.91 MIL/uL (ref 4.22–5.81)
RDW: 12.1 % (ref 11.5–15.5)
WBC Count: 4.9 10*3/uL (ref 4.0–10.5)
nRBC: 0 % (ref 0.0–0.2)

## 2018-12-04 LAB — CMP (CANCER CENTER ONLY)
ALT: 20 U/L (ref 0–44)
AST: 18 U/L (ref 15–41)
Albumin: 4 g/dL (ref 3.5–5.0)
Alkaline Phosphatase: 58 U/L (ref 38–126)
Anion gap: 10 (ref 5–15)
BUN: 11 mg/dL (ref 8–23)
CO2: 22 mmol/L (ref 22–32)
Calcium: 8.8 mg/dL — ABNORMAL LOW (ref 8.9–10.3)
Chloride: 108 mmol/L (ref 98–111)
Creatinine: 0.91 mg/dL (ref 0.61–1.24)
GFR, Est AFR Am: >60 mL/min (ref >60)
GFR, Estimated: >60 mL/min (ref >60)
Glucose, Bld: 108 mg/dL — ABNORMAL HIGH (ref 70–99)
Potassium: 3.8 mmol/L (ref 3.5–5.1)
Sodium: 140 mmol/L (ref 135–145)
Total Bilirubin: 0.4 mg/dL (ref 0.3–1.2)
Total Protein: 6.3 g/dL — ABNORMAL LOW (ref 6.5–8.1)

## 2018-12-04 LAB — TSH: TSH: 2.232 u[IU]/mL (ref 0.320–4.118)

## 2018-12-11 ENCOUNTER — Other Ambulatory Visit: Payer: Self-pay

## 2018-12-11 ENCOUNTER — Encounter: Payer: Self-pay | Admitting: Oncology

## 2018-12-11 ENCOUNTER — Inpatient Hospital Stay: Payer: 59 | Admitting: Oncology

## 2018-12-11 VITALS — BP 138/86 | HR 70 | Temp 97.3°F | Resp 16 | Ht 73.0 in | Wt 214.9 lb

## 2018-12-11 DIAGNOSIS — Z87891 Personal history of nicotine dependence: Secondary | ICD-10-CM

## 2018-12-11 DIAGNOSIS — Z8589 Personal history of malignant neoplasm of other organs and systems: Secondary | ICD-10-CM | POA: Diagnosis not present

## 2018-12-11 DIAGNOSIS — C109 Malignant neoplasm of oropharynx, unspecified: Secondary | ICD-10-CM

## 2018-12-11 DIAGNOSIS — Z931 Gastrostomy status: Secondary | ICD-10-CM

## 2018-12-11 DIAGNOSIS — C770 Secondary and unspecified malignant neoplasm of lymph nodes of head, face and neck: Secondary | ICD-10-CM

## 2018-12-11 DIAGNOSIS — G959 Disease of spinal cord, unspecified: Secondary | ICD-10-CM

## 2018-12-11 NOTE — Progress Notes (Signed)
[facility]  [phone] [phone]    ID: Laura Radilla   DOB: 08/25/1950  MR#: [id number]  DGL#:[id number]  Patient Care Team: Velna Hatchet, MD as PCP - General (Internal Medicine) Melissa Montane, MD as Attending Physician (Otolaryngology) Eppie Gibson, MD (Radiation Oncology) Carol Ada, MD as Attending Physician (Gastroenterology) Felipa Eth, Reece Leader., MD as Referring Physician (Urology) Lorenda Cahill, DMD (Dentistry) Adrian Prows, MD as Consulting Physician (Cardiology) SU: Georganna Skeans MD OTHER MD: Marcial Pacas, Jovita Gamma, Trenton Founds   HISTORY OF HEAD AND NECK CANCER:: From the original intake note:   The patient noted an enlarging left neck mass early March 2013, and brought this to the attention of his PCP, Dr. Charleston Poot. She obtained a neck CT at [facility] 08/20/2011, which showed multiple enlarged nodes in the left mid-neck along the jugular v., the largest 2.0 cm. She referred the patient to Melissa Montane in ENT and FNA of one of the enlarged nodes was positive for squamous cell carcinoma ([id number]). A p16 stain was read as equivocal. PET scan 09/03/2011 showed SUVs >6 in the left oropharynx and left jugular chain nodes; SUV of 5.49 was noted in the larynx. There was no evidence of contralateral or disseminated disease.  This was consistent with a clinical T1 N2,stage IVA left oropharyngeal SCCA. However multiple biopsies under Dr. Janace Hoard 09/09/2011 [[id number]] were all negative. The pathologic staging therefore is TX N2, which impacts radiation planning.  The patient met with Drs. Haskell Riling, and IMRT with concurrent chemosensitization was planned. The patient had dental evaluation under Dr. Enrique Sack, several extractions under Dr. Benson Norway, had audiometry (showing mild high-frequency hearing loss) and met with nutrition. A PEG tube placed by IR unfortunately became dislodged and infected, and had to be replaced through an open  procedure under Georganna Skeans. This delayed the start of chemotherapy and caused a change in plan from the original CDDP at 100 mg/m2 Q3w to 40 mg/M2 weekly. Radiation was started 10/04/2011 and chemotherapy 10/25/2011. The patient's subsequent history is as detailed below.    INTERVAL HISTORY: Yakub returns today for follow-up and treatment of follow-up of his head and neck cancer.  He continues on observation.   Since his last visit here, he underwent a nuclear medicine hepatobiliary scan with gallbladder ef on 04/18/2018 for right upper quadrant pain and nausea with vomiting for several months showing: Prompt uptake and biliary excretion of activity by the liver is seen. Gallbladder activity is visualized, consistent with patency of cystic duct. Biliary activity passes into small bowel, consistent with patent common bile duct. Calculated gallbladder ejection fraction is 61%. (Normal gallbladder ejection fraction with Ensure is greater than 33%.)  He also underwent a CT angio chest PE with contrast on 06/05/2018 for chest pain and shortness of breath showing: No demonstrable pulmonary embolus. No thoracic aortic aneurysm or dissection. There are foci of aortic atherosclerosis. Areas of atelectasis bilaterally. No edema or consolidation. Small nodular opacities on the right remains stable. Stability since 2016 is indicative of benign etiology. No demonstrable adenopathy.   Results for SANDOR, ARBOLEDA (MRN [id number]) as of 12/11/2018 00:26  Ref. Range 05/05/2016 15:27 09/02/2016 13:36 08/29/2017 11:43 09/04/2018 09:32 12/04/2018 09:44  TSH Latest Ref Range: 0.320 - 4.118 uIU/mL 1.764 2.017 2.402 2.472 2.232     REVIEW OF SYSTEMS: Fern notes that he has felt good and healthy. He did have some pain back in late 2019, but he was treated with steroids. For exercise, he usually gets in  about 10,000 steps per day. He has continued to work, but he has been taking appropriate precautions against the spread of  COVID-19. He has noticed that as he has gotten older, it has gotten more difficult for him to get up the [place]. The patient denies unusual headaches, visual changes, nausea, vomiting, or dizziness. There has been no unusual cough, phlegm production, or pleurisy. This been no change in bowel or bladder habits. The patient denies unexplained fatigue or unexplained weight loss, bleeding, rash, or fever. A detailed review of systems was otherwise noncontributory.    PAST MEDICAL HISTORY: Past Medical History:  Diagnosis Date   BPH (benign prostatic hyperplasia)    Dizziness and giddiness 01/04/2014   Fracture of fibula 7/14   left   Gastrostomy tube dependent [facility])    removed Oct. 2013   GERD (gastroesophageal reflux disease)    chronic   Hearing loss of left ear    Herniated lumbar intervertebral disc    L4-5   High cholesterol    History of EKG    done at PCP office, T. Spears- 2012   Hx of colonic polyps    Hyperlipidemia    Hyperlipoproteinemia    Mucositis 11/01/2011   Neuromuscular disorder (HCC)     Radiation Myelopathy   Neuropathy    right upper extremity   Obstructive sleep apnea    on C-pap.    Oropharynx cancer ([place]) 08/26/2011   Pain    right arm and shoulder   Ptosis of left eyelid    Ptosis, left eyelid    Right inguinal hernia    S/P radiation therapy 10/04/11-11/22/11   Mucosal Axis/Neck:7000cGy/35Fractions   Status post chemotherapy    CDDP During with Radiation Therapy   Vertigo    2/2 intersitial neuritis.    Xerostomia     PAST SURGICAL HISTORY: Past Surgical History:  Procedure Laterality Date   DIAGNOSTIC LAPAROSCOPY     g-tube replacement    FINE NEEDLE ASPIRATION  08/26/11    LEFT NECK FNA, LYMPH NODE, METASTATIC SQUAMOUS CELL CARCINOMA   GASTROSTOMY  09/24/2011   Procedure: GASTROSTOMY;  Surgeon: Zenovia Jarred, MD;  Location: [facility];  Service: General;  Laterality: N/A;  open G-tube placement procedure start @1219     [place]  04/24/2012   Procedure: HERNIA REPAIR INGUINAL ADULT;  Surgeon: Zenovia Jarred, MD;  Location: [facility];  Service: General;  Laterality: Right;   INSERTION OF MESH  04/24/2012   Procedure: INSERTION OF MESH;  Surgeon: Zenovia Jarred, MD;  Location: [facility];  Service: General;  Laterality: Right;   PEG TUBE REMOVAL  03/2012   Removal Bony Sequestrum Left 09/29/12   Left mandible / Area #17   RIGID ESOPHAGOSCOPY  09/24/2011   Procedure: RIGID ESOPHAGOSCOPY;  Surgeon: Melissa Montane, MD;  Location: [facility];  Service: ENT;  Laterality: N/A;  Esophagoscopy procedure end @1210    VENTRAL HERNIA REPAIR  04/24/2012   Procedure: LAPAROSCOPIC VENTRAL HERNIA;  Surgeon: Zenovia Jarred, MD;  Location: [facility];  Service: General;  Laterality: N/A;  laparoscopic repair incisional hernia with mesh    FAMILY HISTORY Family History  Problem Relation Age of Onset   Alzheimer's disease Mother    Tics Mother    Hypertension Father    Cancer Father 78       Prostate   Stroke Paternal Grandmother    Cancer Paternal Grandfather  Lung  and Prostate   the patient's father is alive at age 58. He lives in a retirement community. The patient's mother died at the age of 94 in 12-Aug-2009; she had significant Alzheimer's disease. The patient is an only child.  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 His wife Shauna Hugh is a Artist in oil. The live on 8 acres and have 4 horses, a donkey, a Clinical cytogeneticist, chickens, etc. The patient's daughter Caryl Pina lives in [place].. The patient's son Yong Channel works for an Associate Professor. The patient has 2 grandchildren, granddaughter age 54 and a grandson aged 6 years old, who spent a good tela time with them at the 4. He attends the [employer]  ADVANCED DIRECTIVES: in place  HEALTH MAINTENANCE: Social  History   Tobacco Use   Smoking status: Former Smoker    Packs/day: 0.25    Years: 3.00    Pack years: 0.75    Quit date: 06/14/1972    Years since quitting: 46.5   Smokeless tobacco: Never Used  Substance Use Topics   Alcohol use: No    Comment: Hx of Drinking wine - Stopped 3 years ago   Drug use: No     Allergies  Allergen Reactions   Flexeril [Cyclobenzaprine] Anxiety    Confusion    Current Outpatient Medications  Medication Sig Dispense Refill   aspirin EC 81 MG tablet Take 81 mg by mouth daily.     dexlansoprazole (DEXILANT) 60 MG capsule Take 60 mg by mouth at bedtime.      docusate sodium (COLACE) 100 MG capsule Take 100 mg by mouth daily.      MYRBETRIQ 50 MG TB24 tablet Take 50 mg by mouth daily.      Probiotic Product (PROBIOTIC DAILY PO) Take by mouth.     rosuvastatin (CRESTOR) 20 MG tablet Take 20 mg by mouth daily.     sodium fluoride (DENTAGEL) 1.1 % GEL dental gel      tamsulosin (FLOMAX) 0.4 MG CAPS capsule Take 0.4 mg by mouth daily.     zolpidem (AMBIEN) 10 MG tablet Take 10 mg by mouth at bedtime.      No current facility-administered medications for this visit.     OBJECTIVE: Middle-aged white man who appears well  Vitals:   12/11/18 0939  BP: 138/86  Pulse: 70  Resp: 16  Temp: (!) 97.3 F (36.3 C)  SpO2: 100%     Body mass index is 28.35 kg/m.    ECOG FS: 1 Filed Weights   12/11/18 0939  Weight: 214 lb 14.4 oz (97.5 kg)     Sclerae unicteric, EOMs intact Dentition in good repair No cervical or supraclavicular adenopathy Lungs no rales or rhonchi Heart regular rate and rhythm Abd soft, nontender, positive bowel sounds MSK no focal spinal tenderness, no upper extremity lymphedema  LAB RESULTS:  Lab Results  Component Value Date   WBC 4.9 12/04/2018   NEUTROABS 3.0 12/04/2018   HGB 14.8 12/04/2018   HCT 44.8 12/04/2018   MCV 91.2 12/04/2018   PLT 169 12/04/2018      Chemistry      Component Value Date/Time     NA 140 12/04/2018 0952   NA 141 09/02/2016 1336   K 3.8 12/04/2018 0952   K 4.3 09/02/2016 1336   CL 108 12/04/2018 0952   CL 104 10/03/2012 0803   CO2 22 12/04/2018 6294  CO2 29 09/02/2016 1336   BUN 11 12/04/2018 0952   BUN 9.5 09/02/2016 1336   CREATININE 0.91 12/04/2018 0952   CREATININE 0.9 09/02/2016 1336      Component Value Date/Time   CALCIUM 8.8 (L) 12/04/2018 0952   CALCIUM 9.4 09/02/2016 1336   ALKPHOS 58 12/04/2018 0952   ALKPHOS 66 09/02/2016 1336   AST 18 12/04/2018 0952   AST 18 09/02/2016 1336   ALT 20 12/04/2018 0952   ALT 19 09/02/2016 1336   BILITOT 0.4 12/04/2018 0952   BILITOT 0.93 09/02/2016 1336       STUDIES: No results found.   ASSESSMENT: 68 y.o. 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 diagnosed by FNA of a left cervical node March 2013, treated with IMRT completed 11/22/2011 and concurrent weekly cis-platinum, last dose 11/23/2011  (1) risk factors are smoking history (minimal and remote), significant alcohol use (discontinued 2010), and an equivocal p16 probe  (2) dizziness, possibly due to autonomic dysfunction from cis-platinum  (3) malnutrition: PEG removed October 2013, stopped Boost supplements February 2014; maintaining weight orally  (4) dry mouth: discontinued pilocarpine and rinses; drinks extra water during meals  (5) at risk of hypothyroidism: TSH being followed   (6) right hand/arm/shoulder pain: 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; controlled on Lyrica, off narcotics as of March 2014  (7) right inguinal hernia/ventral hernia, repaired 04/24/2012  (8) bony sequestrum area #17, s/p debridement April 2014  (9) mild left ptosis, chronic  (10) status post trauma to the left lower extremity involving laceration and fracture of the fibula, July 2014, resolved  (11) N[facility]N suggested follow-up: In addition to TSH, needs dental evaluation twice a year, ENT evaluation yearly,  continuing monitoring of nutrition and swallowing issues, with imaging as appropriate for signs or symptoms  (12) rising PSA, negative prostate biopsies 06/21/2016  (a) [facility] of the prostate with and without contrast at [facility] 01/18/2017 was suspicious for small volume disease.  (b) PSA of 6.64, 05/08/2018, free 23%, 6% probability of aggressive disease on prostate biopsy--given prior negative biopsy Dr. Michaelle Birks recommends continuing monitoring   PLAN:   Steven Robinson is now 7 years out from completion of his treatment for head and neck cancer, with no evidence of disease recurrence.  This is very favorable.  His thyroid function is fine.  He is maintaining his weight.  He is a bit behind on his dental follow-up because of a recent pandemic but he will be scheduling that.  He has not had an ENT exam in a couple of years and we will see if Dr. Janace Hoard can take a look and reassurance that everything continues well.  His prostate issues are being followed through Dr[facility] Felipa Eth at [facility]  Overall I am delighted that Emillio is doing so well.  He will return to see me in 1 year.  He knows to call for any other issue that may develop before that visit.   Nasean Zapf, Virgie Dad, MD  12/11/18 10:14 AM Medical Oncology and Hematology [facility] [address] [place], [place] [address] Tel. [phone]    Fax. [phone]   I, Jacqualyn Posey am acting as a Education administrator for Chauncey Cruel, MD.   I, Lurline Del MD, have reviewed the above documentation for accuracy and completeness, and I agree with the above.

## 2019-08-07 NOTE — Progress Notes (Signed)
[place]  [phone] [phone]    ID: Steven Robinson   DOB: [birth year]  MR#: [id number]  JYN#:[id number]  Patient Care Team: Velna Hatchet, MD as PCP - General (Internal Medicine) Eppie Gibson, MD (Radiation Oncology) Carol Ada, MD as Attending Physician (Gastroenterology) Felipa Eth, Reece Leader., MD as Referring Physician (Urology) Lorenda Cahill, DMD (Dentistry) Adrian Prows, MD as Consulting Physician (Cardiology) Izora Gala, MD as Consulting Physician (Otolaryngology) SU: Georganna Skeans MD OTHER MD: Marcial Pacas, Jovita Gamma, Doreene Nest  CHIEF COMPLAINT: history of head and neck cancer  CURRENT TREATMENT: observation   INTERVAL HISTORY: Domonik returns today for follow-up of his head and neck cancer. He continues on observation.   He is being seen today for concerns regarding a new lump on his throat.  Per Dr. Janeice Robinson note, the patient noted a fullness on the left side of his neck approximately 10 days ago.  Dr. Janeice Robinson exam finds no palpable masses, some fibrotic tissue on the left level 2 level 3.  He feels what Dushawn was actually feeling was the left thyroid cartilage horn.  Inspection of the oral cavity and pharynx and indirect laryngoscopy showed multiple petechiae through the oropharynx, healthy mobile cords, no mucosal lesions.  He recommended CT of the neck if the abnormality persists.  Lab Results  Component Value Date   TSH 2.232 12/04/2018   TSH 2.472 09/04/2018   TSH 2.402 08/29/2017   TSH 2.017 09/02/2016   TSH 1.764 05/05/2016    REVIEW OF SYSTEMS: Shjon does have some issues with swallowing but they are not new.  Of course there is the dry mouth problem and he is constantly drinking some water.  Sometimes when he eats something rather solid he will swallow it and think it is [place] and then a little later it piece of it may come back up.  What he is not doing is choking or having constant aspiration issues.  There is  certainly no pain on swallowing.  He has not noted any difficulty with range of motion in his neck either anteroposterior or lateral.  He has had both his COVID-19 shots.  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  A detailed review of systems today is otherwise stable   HISTORY OF HEAD AND NECK CANCER:: From the original intake note:   The patient noted an enlarging left neck mass early March 2013, and brought this to the attention of his PCP, Dr. Charleston Poot. She obtained a neck CT at [facility] 08/20/2011, which showed multiple enlarged nodes in the left mid-neck along the jugular v., the largest 2.0 cm. She referred the patient to Melissa Montane in ENT and FNA of one of the enlarged nodes was positive for squamous cell carcinoma ([id number]). A p16 stain was read as equivocal. PET scan 09/03/2011 showed SUVs >6 in the left oropharynx and left jugular chain nodes; SUV of 5.49 was noted in the larynx. There was no evidence of contralateral or disseminated disease.  This was consistent with a clinical T1 N2,stage IVA left oropharyngeal SCCA. However multiple biopsies under Dr. Janace Hoard 09/09/2011 [[id number]] were all negative. The pathologic staging therefore is TX N2, which impacts radiation planning.  The patient met with Drs. Haskell Riling, and IMRT with concurrent chemosensitization was planned. The patient had dental evaluation under Dr. Enrique Sack, several extractions under Dr. Benson Norway, had audiometry (showing mild high-frequency hearing loss) and met with nutrition. A PEG tube placed by IR unfortunately  became dislodged and infected, and had to be replaced through an open procedure under Georganna Skeans. This delayed the start of chemotherapy and caused a change in plan from the original CDDP at 100 mg/m2 Q3w to 40 mg/M2 weekly. Radiation was started 10/04/2011 and chemotherapy 10/25/2011. The patient's subsequent history is as detailed below.    PAST MEDICAL HISTORY: Past  Medical History:  Diagnosis Date  . BPH (benign prostatic hyperplasia)   . Dizziness and giddiness 01/04/2014  . Fracture of fibula 7/14   left  . Gastrostomy tube dependent ([place])    removed Oct. 2013  . GERD (gastroesophageal reflux disease)    chronic  . Hearing loss of left ear   . Herniated lumbar intervertebral disc    L4-5  . High cholesterol   . History of EKG    done at PCP office, T. Spears02/23/2012  . Hx of colonic polyps   . Hyperlipidemia   . Hyperlipoproteinemia   . Mucositis 11/01/2011  . Neuromuscular disorder ([place])     Radiation Myelopathy  . Neuropathy    right upper extremity  . Obstructive sleep apnea    on C-pap.   . Oropharynx cancer ([place]) 08/26/2011  . Pain    right arm and shoulder  . Ptosis of left eyelid   . Ptosis, left eyelid   . Right inguinal hernia   . S/P radiation therapy 10/04/11-11/22/11   Mucosal Axis/Neck:7000cGy/35Fractions  . Status post chemotherapy    CDDP During with Radiation Therapy  . Vertigo    2/2 intersitial neuritis.   . Xerostomia     PAST SURGICAL HISTORY: Past Surgical History:  Procedure Laterality Date  . DIAGNOSTIC LAPAROSCOPY     g-tube replacement   . FINE NEEDLE ASPIRATION  08/26/11    LEFT NECK FNA, LYMPH NODE, METASTATIC SQUAMOUS CELL CARCINOMA  . GASTROSTOMY  09/24/2011   Procedure: GASTROSTOMY;  Surgeon: Zenovia Jarred, MD;  Location: [facility];  Service: General;  Laterality: N/A;  open G-tube placement procedure start _0   . HERNIA REPAIR  1990   Left  . INGUINAL HERNIA REPAIR  04/24/2012   Procedure: HERNIA REPAIR INGUINAL ADULT;  Surgeon: Zenovia Jarred, MD;  Location: [facility];  Service: General;  Laterality: Right;  . INSERTION OF MESH  04/24/2012   Procedure: INSERTION OF MESH;  Surgeon: Zenovia Jarred, MD;  Location: [facility];  Service: General;  Laterality: Right;  . PEG TUBE REMOVAL  03/2012  . Removal Bony Sequestrum Left 09/29/12   Left mandible / Area #17  . RIGID ESOPHAGOSCOPY  09/24/2011    Procedure: RIGID ESOPHAGOSCOPY;  Surgeon: Melissa Montane, MD;  Location: [facility];  Service: ENT;  Laterality: N/A;  Esophagoscopy procedure end _1   . VENTRAL HERNIA REPAIR  04/24/2012   Procedure: LAPAROSCOPIC VENTRAL HERNIA;  Surgeon: Zenovia Jarred, MD;  Location: [facility];  Service: General;  Laterality: N/A;  laparoscopic repair incisional hernia with mesh    FAMILY HISTORY Family History  Problem Relation Age of Onset  . Alzheimer's disease Mother   . Tics Mother   . Hypertension Father   . Cancer Father 39       Prostate  . Stroke Paternal Grandmother   . Cancer Paternal Grandfather        Lung  and Prostate   the patient's father is alive at age 71. He lives in a retirement community. The patient's mother died at the age of 42 in 08-06-2009; she had significant Alzheimer's disease. The  patient is an only child.   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 His wife Shauna Hugh is a Artist in oil.  She is 17 years out from her breast cancer with no evidence of disease recurrence.  The live on 8 acres and have several horses, a donkey, a Clinical cytogeneticist, chickens, etc. The patient's daughter Caryl Pina lives in [place].. The patient's son Yong Channel works for an Associate Professor. The patient has 2 grandchildren, granddaughter age 32 and a grandson aged 16 years old, Louden attends the [employer]   ADVANCED DIRECTIVES: in place   HEALTH MAINTENANCE: Social History   Tobacco Use  . Smoking status: Former Smoker    Packs/day: 0.25    Years: 3.00    Pack years: 0.75    Quit date: 06/14/1972    Years since quitting: 47.1  . Smokeless tobacco: Never Used  Substance Use Topics  . Alcohol use: No    Comment: Hx of Drinking wine - Stopped 3 years ago  . Drug use: No     Allergies  Allergen Reactions  . Flexeril [Cyclobenzaprine] Anxiety    Confusion     Current Outpatient Medications  Medication Sig Dispense Refill  . aspirin EC 81 MG tablet Take 81 mg by mouth daily.    [name] dexlansoprazole (DEXILANT) 60 MG capsule Take 60 mg by mouth at bedtime.     . docusate sodium (COLACE) 100 MG capsule Take 100 mg by mouth daily.     [name] MYRBETRIQ 50 MG TB24 tablet Take 50 mg by mouth daily.     . Probiotic Product (PROBIOTIC DAILY PO) Take by mouth.    . rosuvastatin (CRESTOR) 20 MG tablet Take 20 mg by mouth daily.    . sodium fluoride (DENTAGEL) 1.1 % GEL dental gel     . tamsulosin (FLOMAX) 0.4 MG CAPS capsule Take 0.4 mg by mouth daily.    [name] zolpidem (AMBIEN) 10 MG tablet Take 10 mg by mouth at bedtime.      No current facility-administered medications for this visit.    OBJECTIVE: Middle-aged white man in no acute distress  Vitals:   08/08/19 0823  BP: 128/75  Pulse: 85  Resp: 18  Temp: 98.3 F (36.8 C)  SpO2: 100%     Body mass index is 29.43 kg/m.    ECOG FS: 1 Filed Weights   08/08/19 0823  Weight: 223 lb 1.6 oz (101.2 kg)     Sclerae unicteric, EOMs intact Wearing a mask No cervical or supraclavicular adenopathy; some scar tissue on the left as previously noted; the area he is feeling is symmetrical with the right side where it is slightly less easily palpable Lungs no rales or rhonchi Heart regular rate and rhythm Abd soft, nontender, positive bowel sounds MSK no focal spinal tenderness, no upper extremity lymphedema Neuro: nonfocal, well oriented, appropriate affect   LAB RESULTS:  Lab Results  Component Value Date   WBC 4.9 12/04/2018   NEUTROABS 3.0 12/04/2018   HGB 14.8 12/04/2018   HCT 44.8 12/04/2018   MCV 91.2 12/04/2018   PLT 169 12/04/2018      Chemistry      Component Value Date/Time   NA 140 12/04/2018 0952   NA 141 09/02/2016 1336   K 3.8 12/04/2018 0952   K 4.3 09/02/2016 1336   CL 108 12/04/2018 0952   CL 104 10/03/2012 0803  CO2 22 12/04/2018 0952   CO2 29 09/02/2016 1336   BUN 11  12/04/2018 0952   BUN 9.5 09/02/2016 1336   CREATININE 0.91 12/04/2018 0952   CREATININE 0.9 09/02/2016 1336      Component Value Date/Time   CALCIUM 8.8 (L) 12/04/2018 0952   CALCIUM 9.4 09/02/2016 1336   ALKPHOS 58 12/04/2018 0952   ALKPHOS 66 09/02/2016 1336   AST 18 12/04/2018 0952   AST 18 09/02/2016 1336   ALT 20 12/04/2018 0952   ALT 19 09/02/2016 1336   BILITOT 0.4 12/04/2018 0952   BILITOT 0.93 09/02/2016 1336      STUDIES: No results found.   ASSESSMENT: 69 y.o.  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 diagnosed by FNA of a left cervical node March 2013, treated with IMRT completed 11/22/2011 and concurrent weekly cis-platinum, last dose 11/23/2011  (1) risk factors are smoking history (minimal and remote), significant alcohol use (discontinued 2010), and an equivocal p16 probe  (2) dizziness, possibly due to autonomic dysfunction from cis-platinum  (3) malnutrition: PEG removed October 2013, stopped Boost supplements February 2014; maintaining weight orally  (4) dry mouth: discontinued pilocarpine and rinses; drinks extra water during meals  (5) at risk of hypothyroidism: TSH being followed   (6) right hand/arm/shoulder pain: 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; controlled on Lyrica, off narcotics as of March 2014  (7) right inguinal hernia/ventral hernia, repaired 04/24/2012  (8) bony sequestrum area #17, s/p debridement April 2014  (9) mild left ptosis, chronic  (10) status post trauma to the left lower extremity involving laceration and fracture of the fibula, July 2014, resolved  (11) NCCN suggested follow-up: In addition to TSH, needs dental evaluation twice a year, ENT evaluation yearly, continuing monitoring of nutrition and swallowing issues, with imaging as appropriate for signs or symptoms  (12) rising PSA, negative prostate biopsies 06/21/2016, followed closely by Dr. Felipa Eth   PLAN:    Kasheem will soon be 8 years out from completion of therapy for his head and neck cancer.  The change or lump he is feeling in his left neck I think it is symmetrical with a similar on the right and is likely cartilage horn as suggested by Dr. Constance Holster.  The left side does have significant scar tissue and is therefore asymmetric.  Even though this is going to be very likely all benign I think it would be reassuring to Barton to take a look with a CT of the neck and so I have ordered that.  He did have indirect laryngoscopy under Dr. Constance Holster and it is probably a good idea for him to see Dr. Constance Holster on a yearly basis and definitely not only because of his prior cancer but because of the significant radiation he had possibly causing cancer in the future.  If Johnross sees me in February and sees Dr. Constance Holster in August or so on a yearly basis I would be optimal.  I am obtaining a TSH today.  I do not have the lab work obtained by Dr. Ardeth Perfect on him.  Of course he is at risk for hypothyroidism given his prior treatment  I am delighted to see him so well nourished, and with such an excellent functional status.  He will return to see me in 1 year  Total encounter time 25 minutes.* Eleina Jergens, Virgie Dad, MD  08/08/19 8:47 AM Medical Oncology and Hematology [facility] [place], [place] [address] Tel. [phone]  Fax. [phone]    I, Wilburn Mylar, am acting as scribe for Dr. Virgie Dad. Sukanya Goldblatt.  I, Lurline Del MD, have reviewed the above documentation for accuracy and completeness, and I agree with the above.  *Total Encounter Time as defined by the Centers for Medicare and Medicaid Services includes, in addition to the face-to-face time of a patient visit (documented in the note above) non-face-to-face time: obtaining and reviewing outside history, ordering and reviewing medications, tests or procedures, care coordination (communications with other health care  professionals or caregivers) and documentation in the medical record.

## 2019-08-08 ENCOUNTER — Inpatient Hospital Stay: Payer: 59 | Attending: Oncology | Admitting: Oncology

## 2019-08-08 ENCOUNTER — Ambulatory Visit: Payer: 59

## 2019-08-08 ENCOUNTER — Encounter: Payer: Self-pay | Admitting: Oncology

## 2019-08-08 ENCOUNTER — Inpatient Hospital Stay: Payer: 59

## 2019-08-08 ENCOUNTER — Other Ambulatory Visit: Payer: Self-pay

## 2019-08-08 VITALS — BP 128/75 | HR 85 | Temp 98.3°F | Resp 18 | Ht 73.0 in | Wt 223.1 lb

## 2019-08-08 DIAGNOSIS — C109 Malignant neoplasm of oropharynx, unspecified: Secondary | ICD-10-CM | POA: Diagnosis not present

## 2019-08-08 DIAGNOSIS — R221 Localized swelling, mass and lump, neck: Secondary | ICD-10-CM | POA: Insufficient documentation

## 2019-08-08 DIAGNOSIS — Z85818 Personal history of malignant neoplasm of other sites of lip, oral cavity, and pharynx: Secondary | ICD-10-CM | POA: Diagnosis not present

## 2019-08-08 DIAGNOSIS — G959 Disease of spinal cord, unspecified: Secondary | ICD-10-CM

## 2019-08-08 DIAGNOSIS — G4733 Obstructive sleep apnea (adult) (pediatric): Secondary | ICD-10-CM

## 2019-08-08 DIAGNOSIS — Z79899 Other long term (current) drug therapy: Secondary | ICD-10-CM | POA: Diagnosis not present

## 2019-08-08 DIAGNOSIS — C770 Secondary and unspecified malignant neoplasm of lymph nodes of head, face and neck: Secondary | ICD-10-CM

## 2019-08-08 DIAGNOSIS — R682 Dry mouth, unspecified: Secondary | ICD-10-CM | POA: Insufficient documentation

## 2019-08-08 DIAGNOSIS — M5412 Radiculopathy, cervical region: Secondary | ICD-10-CM

## 2019-08-08 DIAGNOSIS — Z87891 Personal history of nicotine dependence: Secondary | ICD-10-CM | POA: Diagnosis not present

## 2019-08-08 DIAGNOSIS — E7800 Pure hypercholesterolemia, unspecified: Secondary | ICD-10-CM

## 2019-08-08 LAB — COMPREHENSIVE METABOLIC PANEL
ALT: 15 U/L (ref 0–44)
AST: 15 U/L (ref 15–41)
Albumin: 3.9 g/dL (ref 3.5–5.0)
Alkaline Phosphatase: 63 U/L (ref 38–126)
Anion gap: 10 (ref 5–15)
BUN: 14 mg/dL (ref 8–23)
CO2: 24 mmol/L (ref 22–32)
Calcium: 8.5 mg/dL — ABNORMAL LOW (ref 8.9–10.3)
Chloride: 109 mmol/L (ref 98–111)
Creatinine, Ser: 0.84 mg/dL (ref 0.61–1.24)
GFR calc Af Amer: >60 mL/min (ref >60)
GFR calc non Af Amer: >60 mL/min (ref >60)
Glucose, Bld: 78 mg/dL (ref 70–99)
Potassium: 4 mmol/L (ref 3.5–5.1)
Sodium: 143 mmol/L (ref 135–145)
Total Bilirubin: 0.6 mg/dL (ref 0.3–1.2)
Total Protein: 6.2 g/dL — ABNORMAL LOW (ref 6.5–8.1)

## 2019-08-08 LAB — CBC WITH DIFFERENTIAL/PLATELET
Abs Immature Granulocytes: 0.02 10*3/uL (ref 0.00–0.07)
Basophils Absolute: 0.1 10*3/uL (ref 0.0–0.1)
Basophils Relative: 1 %
Eosinophils Absolute: 0.2 10*3/uL (ref 0.0–0.5)
Eosinophils Relative: 3 %
HCT: 42.6 % (ref 39.0–52.0)
Hemoglobin: 14.2 g/dL (ref 13.0–17.0)
Immature Granulocytes: 0 %
Lymphocytes Relative: 23 %
Lymphs Abs: 1.1 10*3/uL (ref 0.7–4.0)
MCH: 29.9 pg (ref 26.0–34.0)
MCHC: 33.3 g/dL (ref 30.0–36.0)
MCV: 89.7 fL (ref 80.0–100.0)
Monocytes Absolute: 0.6 10*3/uL (ref 0.1–1.0)
Monocytes Relative: 12 %
Neutro Abs: 3 10*3/uL (ref 1.7–7.7)
Neutrophils Relative %: 61 %
Platelets: 157 10*3/uL (ref 150–400)
RBC: 4.75 MIL/uL (ref 4.22–5.81)
RDW: 11.9 % (ref 11.5–15.5)
WBC: 5 10*3/uL (ref 4.0–10.5)
nRBC: 0 % (ref 0.0–0.2)

## 2019-08-08 LAB — TSH: TSH: 0.49 u[IU]/mL (ref 0.320–4.118)

## 2019-08-09 ENCOUNTER — Telehealth: Payer: Self-pay | Admitting: Oncology

## 2019-08-09 NOTE — Telephone Encounter (Signed)
I talk with patient regarding schedule  

## 2019-08-22 ENCOUNTER — Other Ambulatory Visit: Payer: Self-pay

## 2019-08-22 ENCOUNTER — Encounter (HOSPITAL_COMMUNITY): Payer: Self-pay

## 2019-08-22 ENCOUNTER — Ambulatory Visit (HOSPITAL_COMMUNITY)
Admission: RE | Admit: 2019-08-22 | Discharge: 2019-08-22 | Disposition: A | Discharge status: Home / Self Care | Payer: 59 | Source: Ambulatory Visit | Attending: Oncology | Admitting: Oncology

## 2019-08-22 DIAGNOSIS — C770 Secondary and unspecified malignant neoplasm of lymph nodes of head, face and neck: Secondary | ICD-10-CM | POA: Diagnosis present

## 2019-08-22 DIAGNOSIS — M5412 Radiculopathy, cervical region: Secondary | ICD-10-CM | POA: Diagnosis present

## 2019-08-22 DIAGNOSIS — E7800 Pure hypercholesterolemia, unspecified: Secondary | ICD-10-CM | POA: Diagnosis present

## 2019-08-22 DIAGNOSIS — G959 Disease of spinal cord, unspecified: Secondary | ICD-10-CM | POA: Diagnosis present

## 2019-08-22 DIAGNOSIS — G4733 Obstructive sleep apnea (adult) (pediatric): Secondary | ICD-10-CM | POA: Diagnosis present

## 2019-08-22 DIAGNOSIS — C109 Malignant neoplasm of oropharynx, unspecified: Secondary | ICD-10-CM | POA: Diagnosis not present

## 2019-08-22 MED ORDER — IOHEXOL 300 MG/ML  SOLN
75.0000 mL | Freq: Once | INTRAMUSCULAR | Status: AC | PRN
  Administered 2019-08-22: 75 mL via INTRAVENOUS

## 2019-08-22 MED ORDER — SODIUM CHLORIDE (PF) 0.9 % IJ SOLN
INTRAMUSCULAR | Status: AC
  Filled 2019-08-22: qty 50

## 2019-08-23 ENCOUNTER — Encounter: Payer: Self-pay | Admitting: Oncology

## 2020-08-06 NOTE — Progress Notes (Signed)
[facility]  [phone]) [phone] [phone]    ID: Steven Robinson   DOB: [birth year]  MR#: [id number]  JYN#:[id number]  Patient Care Team: Alysia Penna, MD as PCP - General (Internal Medicine) Lonie Peak, MD (Radiation Oncology) Jeani Hawking, MD as Attending Physician (Gastroenterology) Pete Glatter, Danford Bad., MD as Referring Physician (Urology) Marchia Bond, DMD (Dentistry) Yates Decamp, MD as Consulting Physician (Cardiology) Serena Colonel, MD as Consulting Physician (Otolaryngology) SU: Violeta Gelinas MD OTHER MD: Levert Feinstein, Shirlean Kelly, Tamera Punt  CHIEF COMPLAINT: history of head and neck cancer  CURRENT TREATMENT: observation   INTERVAL HISTORY: Steven Robinson returns today for follow-up of his head and neck cancer. He continues on observation.   Since his last visit, he underwent neck CT on 08/22/2019 showing: stable mucosal thickening in left posterolateral aspect of oropharynx without evidence of hyperenhancement, no discrete mass lesion; no adenopathy.  He also was evaluated by Dr. Pollyann Kennedy the fall 2022, with no evidence of cancer recurrence and no evidence of any new cancer secondary to his earlier radiation  Lab Results  Component Value Date   TSH 0.490 08/08/2019   TSH 2.232 12/04/2018   TSH 2.472 09/04/2018   TSH 2.402 08/29/2017   TSH 2.017 09/02/2016    REVIEW OF SYSTEMS: Delancey is back to work in person, meaning that he travels to [place] quite a bit.  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  He has slightly dry mouth as before and drinks a lot of fluid.  He is worried about his weight because he is having too many root beers he says.  His PSA has been up and he is known to have an enlarged prostate.  He has had prior biopsies.  He is now followed by Dr. Sidonie Dickens and was started on finasteride and they are planning repeat evaluation in about 6 months.   COVID 19  VACCINATION STATUS: vaccinated x2 with booster October 2021   HISTORY OF HEAD AND NECK CANCER:: From the original intake note:   The patient noted an enlarging left neck mass early March 2013, and brought this to the attention of his PCP, Dr. Dewain Penning. She obtained a neck CT at [facility] 08/20/2011, which showed multiple enlarged nodes in the left mid-neck along the jugular v., the largest 2.0 cm. She referred the patient to Suzanna Obey in ENT and FNA of one of the enlarged nodes was positive for squamous cell carcinoma ([id number]). A p16 stain was read as equivocal. PET scan 09/03/2011 showed SUVs >6 in the left oropharynx and left jugular chain nodes; SUV of 5.49 was noted in the larynx. There was no evidence of contralateral or disseminated disease.  This was consistent with a clinical T1 N2,stage IVA left oropharyngeal SCCA. However multiple biopsies under Dr. Jearld Fenton 09/09/2011 [[id number]] were all negative. The pathologic staging therefore is TX N2, which impacts radiation planning.  The patient met with Drs. Cruzita Lederer, and IMRT with concurrent chemosensitization was planned. The patient had dental evaluation under Dr. Kristin Bruins, several extractions under Dr. Chales Salmon, had audiometry (showing mild high-frequency hearing loss) and met with nutrition. A PEG tube placed by IR unfortunately became dislodged and infected, and had to be replaced through an open procedure under Violeta Gelinas. This delayed the start of chemotherapy and caused a change in plan from the original CDDP at 100 mg/m2 Q3w to 40 mg/M2 weekly. Radiation was started 10/04/2011 and  chemotherapy 10/25/2011. The patient's subsequent history is as detailed below.    PAST MEDICAL HISTORY: Past Medical History:  Diagnosis Date  . BPH (benign prostatic hyperplasia)   . Dizziness and giddiness 01/04/2014  . Fracture of fibula 7/14   left  . Gastrostomy tube dependent (HCC)    removed Oct. 2013  . GERD (gastroesophageal reflux disease)     chronic  . Hearing loss of left ear   . Herniated lumbar intervertebral disc    L4-5  . High cholesterol   . History of EKG    done at PCP office, T. Spears2012-02-03  . Hx of colonic polyps   . Hyperlipidemia   . Hyperlipoproteinemia   . Mucositis 11/01/2011  . Neuromuscular disorder (HCC)     Radiation Myelopathy  . Neuropathy    right upper extremity  . Obstructive sleep apnea    on C-pap.   . Oropharynx cancer (HCC) 08/26/2011  . Pain    right arm and shoulder  . Ptosis of left eyelid   . Ptosis, left eyelid   . Right inguinal hernia   . S/P radiation therapy 10/04/11-11/22/11   Mucosal Axis/Neck:7000cGy/35Fractions  . Status post chemotherapy    CDDP During with Radiation Therapy  . Vertigo    2/2 intersitial neuritis.   . Xerostomia     PAST SURGICAL HISTORY: Past Surgical History:  Procedure Laterality Date  . DIAGNOSTIC LAPAROSCOPY     g-tube replacement   . FINE NEEDLE ASPIRATION  08/26/11    LEFT NECK FNA, LYMPH NODE, METASTATIC SQUAMOUS CELL CARCINOMA  . GASTROSTOMY  09/24/2011   Procedure: GASTROSTOMY;  Surgeon: Liz Malady, MD;  Location: [facility] OR;  Service: General;  Laterality: N/A;  open G-tube placement procedure start @1219   . HERNIA REPAIR  1990   Left  . INGUINAL HERNIA REPAIR  04/24/2012   Procedure: HERNIA REPAIR INGUINAL ADULT;  Surgeon: Liz Malady, MD;  Location: [facility] OR;  Service: General;  Laterality: Right;  . INSERTION OF MESH  04/24/2012   Procedure: INSERTION OF MESH;  Surgeon: Liz Malady, MD;  Location: [facility] OR;  Service: General;  Laterality: Right;  . PEG TUBE REMOVAL  03/2012  . Removal Bony Sequestrum Left 09/29/12   Left mandible / Area #17  . RIGID ESOPHAGOSCOPY  09/24/2011   Procedure: RIGID ESOPHAGOSCOPY;  Surgeon: Suzanna Obey, MD;  Location: [facility] OR;  Service: ENT;  Laterality: N/A;  Esophagoscopy procedure end @1210   . VENTRAL HERNIA REPAIR  04/24/2012   Procedure: LAPAROSCOPIC VENTRAL HERNIA;  Surgeon: Liz Malady, MD;   Location: [facility] OR;  Service: General;  Laterality: N/A;  laparoscopic repair incisional hernia with mesh    FAMILY HISTORY Family History  Problem Relation Age of Onset  . Alzheimer's disease Mother   . Tics Mother   . Hypertension Father   . Cancer Father 40       Prostate  . Stroke Paternal Grandmother   . Cancer Paternal Grandfather        Lung  and Prostate   the patient's father is alive at age 49. He lives in a retirement community. The patient's mother died at the age of 82 in 07/17/09; she had significant Alzheimer's disease. The patient is an only child.   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 His wife [name] is a Music therapist in oil.  She is 17 years out from  her breast cancer with no evidence of disease recurrence.  The live on 8 acres and have several horses, a donkey, a Engineer, manufacturing, chickens, etc. The patient's daughter [name] lives in [place].. The patient's son [name] works for an Economist. The patient has 2 grandchildren, granddaughter age 46 and a grandson aged 69 years old, James attends the [employer].   ADVANCED DIRECTIVES: in place   HEALTH MAINTENANCE: Social History   Tobacco Use  . Smoking status: Former Smoker    Packs/day: 0.25    Years: 3.00    Pack years: 0.75    Quit date: 06/14/1972    Years since quitting: 48.1  . Smokeless tobacco: Never Used  Substance Use Topics  . Alcohol use: No    Comment: Hx of Drinking wine - Stopped 3 years ago  . Drug use: No     PSA: per urology   Allergies  Allergen Reactions  . Flexeril [Cyclobenzaprine] Anxiety    Confusion    Current Outpatient Medications  Medication Sig Dispense Refill  . aspirin EC 81 MG tablet Take 81 mg by mouth daily.    [name] dexlansoprazole (DEXILANT) 60 MG capsule Take 60 mg by mouth at bedtime.     . docusate sodium (COLACE) 100  MG capsule Take 100 mg by mouth daily.     [name] MYRBETRIQ 50 MG TB24 tablet Take 50 mg by mouth daily.     . Probiotic Product (PROBIOTIC DAILY PO) Take by mouth.    . rosuvastatin (CRESTOR) 20 MG tablet Take 20 mg by mouth daily.    . sodium fluoride (DENTAGEL) 1.1 % GEL dental gel     . tamsulosin (FLOMAX) 0.4 MG CAPS capsule Take 0.4 mg by mouth daily.    [name] zolpidem (AMBIEN) 10 MG tablet Take 10 mg by mouth at bedtime.      No current facility-administered medications for this visit.    OBJECTIVE: White man who appears well  Vitals:   08/07/20 0932  BP: 120/72  Pulse: 79  Resp: 18  Temp: 97.6 F (36.4 C)  SpO2: 97%     Body mass index is 29.43 kg/m.    ECOG FS: 1 There were no vitals filed for this visit.   Sclerae unicteric, EOMs intact Oropharynx minimally dry, no oral or tongue lesions noted No cervical or supraclavicular adenopathy Lungs no rales or rhonchi Heart regular rate and rhythm Abd soft, nontender, positive bowel sounds MSK no focal spinal tenderness Neuro: nonfocal, well oriented, appropriate affect   LAB RESULTS:  Lab Results  Component Value Date   WBC 5.6 08/07/2020   NEUTROABS 3.6 08/07/2020   HGB 15.1 08/07/2020   HCT 44.7 08/07/2020   MCV 91.2 08/07/2020   PLT 154 08/07/2020      Chemistry      Component Value Date/Time   NA 143 08/08/2019 0855   NA 141 09/02/2016 1336   K 4.0 08/08/2019 0855   K 4.3 09/02/2016 1336   CL 109 08/08/2019 0855   CL 104 10/03/2012 0803   CO2 24 08/08/2019 0855   CO2 29 09/02/2016 1336   BUN 14 08/08/2019 0855   BUN 9.5 09/02/2016 1336   CREATININE 0.84 08/08/2019 0855   CREATININE 0.91 12/04/2018 0952   CREATININE 0.9 09/02/2016 1336      Component Value Date/Time   CALCIUM 8.5 (L) 08/08/2019 0855   CALCIUM 9.4 09/02/2016 1336   ALKPHOS 63 08/08/2019 0855   ALKPHOS 66 09/02/2016 1336  AST 15 08/08/2019 0855   AST 18 12/04/2018 0952   AST 18 09/02/2016 1336   ALT 15 08/08/2019 0855   ALT 20  12/04/2018 0952   ALT 19 09/02/2016 1336   BILITOT 0.6 08/08/2019 0855   BILITOT 0.4 12/04/2018 0952   BILITOT 0.93 09/02/2016 1336      STUDIES: No results found.   ASSESSMENT: 70 y.o. 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 diagnosed by FNA of a left cervical node March 2013, treated with IMRT completed 11/22/2011 and concurrent weekly cis-platinum, last dose 11/23/2011  (1) risk factors are smoking history (minimal and remote), significant alcohol use (discontinued 2010), and an equivocal p16 probe  (2) dizziness, possibly due to autonomic dysfunction from cis-platinum  (3) malnutrition: PEG removed October 2013, stopped Boost supplements February 2014; maintaining weight orally  (4) dry mouth: discontinued pilocarpine and rinses; drinks extra water during meals  (5) at risk of hypothyroidism: TSH being followed   (6) right hand/arm/shoulder pain: 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; controlled on Lyrica, off narcotics as of March 2014  (7) right inguinal hernia/ventral hernia, repaired 04/24/2012  (8) bony sequestrum area #17, s/p debridement April 2014  (9) mild left ptosis, chronic  (10) status post trauma to the left lower extremity involving laceration and fracture of the fibula, July 2014, resolved  (11) NCCN suggested follow-up: In addition to TSH, needs dental evaluation twice a year, ENT evaluation yearly, continuing monitoring of nutrition and swallowing issues, with imaging as appropriate for signs or symptoms  (12) rising PSA, negative prostate biopsies 06/21/2016, followed closely by Dr. Pete Glatter   PLAN:   Dayson is now just about 9 years out from initial diagnosis of his squamous cell carcinoma of the head and neck, with no evidence of disease recurrence.  This is very favorable.  We have been following his TSH which is not shown evidence of hypothyroidism.  He sees ENT once a year, and I have  encouraged him to continue to see Dr. Pollyann Kennedy yearly for monitoring.  He does have an elevated PSA, has had prior biopsies and CTs, and is being monitored actively by urology for that.  We will see him at least 1 more time, a year from now, for his 10th anniversary, and at that point he may choose to "graduate" from follow-up here.  Total encounter time 25 minutes.*   Areesha Dehaven, Valentino Hue, MD  08/07/20 9:39 AM Medical Oncology and Hematology [facility] [address] [place], [place] [address] Tel. [phone]    Fax. [phone]    I, Mickie Bail, am acting as scribe for Dr. Valentino Hue. Kallan Merrick.  I, Ruthann Cancer MD, have reviewed the above documentation for accuracy and completeness, and I agree with the above.   *Total Encounter Time as defined by the Centers for Medicare and Medicaid Services includes, in addition to the face-to-face time of a patient visit (documented in the note above) non-face-to-face time: obtaining and reviewing outside history, ordering and reviewing medications, tests or procedures, care coordination (communications with other health care professionals or caregivers) and documentation in the medical record.

## 2020-08-07 ENCOUNTER — Inpatient Hospital Stay: Payer: 59

## 2020-08-07 ENCOUNTER — Other Ambulatory Visit: Payer: Self-pay

## 2020-08-07 ENCOUNTER — Inpatient Hospital Stay: Payer: 59 | Attending: Oncology | Admitting: Oncology

## 2020-08-07 VITALS — BP 120/72 | HR 79 | Temp 97.6°F | Resp 18 | Ht 73.0 in | Wt 230.4 lb

## 2020-08-07 DIAGNOSIS — C770 Secondary and unspecified malignant neoplasm of lymph nodes of head, face and neck: Secondary | ICD-10-CM

## 2020-08-07 DIAGNOSIS — C109 Malignant neoplasm of oropharynx, unspecified: Secondary | ICD-10-CM | POA: Diagnosis present

## 2020-08-07 DIAGNOSIS — Z87891 Personal history of nicotine dependence: Secondary | ICD-10-CM | POA: Insufficient documentation

## 2020-08-07 DIAGNOSIS — Z923 Personal history of irradiation: Secondary | ICD-10-CM | POA: Insufficient documentation

## 2020-08-07 DIAGNOSIS — M5412 Radiculopathy, cervical region: Secondary | ICD-10-CM

## 2020-08-07 DIAGNOSIS — Z79899 Other long term (current) drug therapy: Secondary | ICD-10-CM | POA: Diagnosis not present

## 2020-08-07 DIAGNOSIS — G959 Disease of spinal cord, unspecified: Secondary | ICD-10-CM | POA: Diagnosis not present

## 2020-08-07 DIAGNOSIS — Z9221 Personal history of antineoplastic chemotherapy: Secondary | ICD-10-CM | POA: Insufficient documentation

## 2020-08-07 DIAGNOSIS — G4733 Obstructive sleep apnea (adult) (pediatric): Secondary | ICD-10-CM | POA: Diagnosis not present

## 2020-08-07 DIAGNOSIS — R972 Elevated prostate specific antigen [PSA]: Secondary | ICD-10-CM | POA: Diagnosis not present

## 2020-08-07 DIAGNOSIS — E7800 Pure hypercholesterolemia, unspecified: Secondary | ICD-10-CM

## 2020-08-07 LAB — CBC WITH DIFFERENTIAL/PLATELET
Abs Immature Granulocytes: 0.02 10*3/uL (ref 0.00–0.07)
Basophils Absolute: 0.1 10*3/uL (ref 0.0–0.1)
Basophils Relative: 1 %
Eosinophils Absolute: 0.2 10*3/uL (ref 0.0–0.5)
Eosinophils Relative: 3 %
HCT: 44.7 % (ref 39.0–52.0)
Hemoglobin: 15.1 g/dL (ref 13.0–17.0)
Immature Granulocytes: 0 %
Lymphocytes Relative: 23 %
Lymphs Abs: 1.3 10*3/uL (ref 0.7–4.0)
MCH: 30.8 pg (ref 26.0–34.0)
MCHC: 33.8 g/dL (ref 30.0–36.0)
MCV: 91.2 fL (ref 80.0–100.0)
Monocytes Absolute: 0.5 10*3/uL (ref 0.1–1.0)
Monocytes Relative: 9 %
Neutro Abs: 3.6 10*3/uL (ref 1.7–7.7)
Neutrophils Relative %: 64 %
Platelets: 154 10*3/uL (ref 150–400)
RBC: 4.9 MIL/uL (ref 4.22–5.81)
RDW: 11.9 % (ref 11.5–15.5)
WBC: 5.6 10*3/uL (ref 4.0–10.5)
nRBC: 0 % (ref 0.0–0.2)

## 2020-08-07 LAB — COMPREHENSIVE METABOLIC PANEL
ALT: 18 U/L (ref 0–44)
AST: 17 U/L (ref 15–41)
Albumin: 4.1 g/dL (ref 3.5–5.0)
Alkaline Phosphatase: 70 U/L (ref 38–126)
Anion gap: 10 (ref 5–15)
BUN: 14 mg/dL (ref 8–23)
CO2: 25 mmol/L (ref 22–32)
Calcium: 8.9 mg/dL (ref 8.9–10.3)
Chloride: 107 mmol/L (ref 98–111)
Creatinine, Ser: 0.92 mg/dL (ref 0.61–1.24)
GFR, Estimated: >60 mL/min (ref >60)
Glucose, Bld: 90 mg/dL (ref 70–99)
Potassium: 3.8 mmol/L (ref 3.5–5.1)
Sodium: 142 mmol/L (ref 135–145)
Total Bilirubin: 0.6 mg/dL (ref 0.3–1.2)
Total Protein: 6.6 g/dL (ref 6.5–8.1)

## 2020-08-07 LAB — TSH: TSH: 2.072 u[IU]/mL (ref 0.320–4.118)

## 2020-08-08 ENCOUNTER — Telehealth: Payer: Self-pay | Admitting: Oncology

## 2020-08-08 NOTE — Telephone Encounter (Signed)
Made no changes to pt's schedule as appts requested in 2/24 LOS were past the open template for the provider. Sent message to Karie Mainland about pt, and providers template.

## 2020-08-13 ENCOUNTER — Telehealth: Payer: Self-pay | Admitting: Hematology and Oncology

## 2020-08-13 NOTE — Telephone Encounter (Signed)
Scheduled appointments per 2/25 Dr. Virgie Dad los. Mailed updated calendar to patient.

## 2021-08-12 ENCOUNTER — Other Ambulatory Visit: Payer: Self-pay

## 2021-08-12 ENCOUNTER — Encounter: Payer: Self-pay | Admitting: Hematology and Oncology

## 2021-08-12 ENCOUNTER — Ambulatory Visit: Payer: 59 | Admitting: Hematology and Oncology

## 2021-08-12 ENCOUNTER — Inpatient Hospital Stay: Payer: 59 | Attending: Hematology and Oncology

## 2021-08-12 ENCOUNTER — Inpatient Hospital Stay: Payer: 59 | Admitting: Hematology and Oncology

## 2021-08-12 ENCOUNTER — Other Ambulatory Visit: Payer: 59

## 2021-08-12 VITALS — BP 125/78 | HR 64 | Temp 97.9°F | Resp 16 | Ht 73.0 in | Wt 228.3 lb

## 2021-08-12 DIAGNOSIS — R9721 Rising PSA following treatment for malignant neoplasm of prostate: Secondary | ICD-10-CM | POA: Diagnosis not present

## 2021-08-12 DIAGNOSIS — R42 Dizziness and giddiness: Secondary | ICD-10-CM | POA: Insufficient documentation

## 2021-08-12 DIAGNOSIS — Z85818 Personal history of malignant neoplasm of other sites of lip, oral cavity, and pharynx: Secondary | ICD-10-CM | POA: Insufficient documentation

## 2021-08-12 DIAGNOSIS — M5412 Radiculopathy, cervical region: Secondary | ICD-10-CM

## 2021-08-12 DIAGNOSIS — C109 Malignant neoplasm of oropharynx, unspecified: Secondary | ICD-10-CM

## 2021-08-12 DIAGNOSIS — G4733 Obstructive sleep apnea (adult) (pediatric): Secondary | ICD-10-CM

## 2021-08-12 DIAGNOSIS — Z79899 Other long term (current) drug therapy: Secondary | ICD-10-CM | POA: Diagnosis not present

## 2021-08-12 DIAGNOSIS — Z9221 Personal history of antineoplastic chemotherapy: Secondary | ICD-10-CM | POA: Insufficient documentation

## 2021-08-12 DIAGNOSIS — Z87891 Personal history of nicotine dependence: Secondary | ICD-10-CM | POA: Diagnosis not present

## 2021-08-12 DIAGNOSIS — R682 Dry mouth, unspecified: Secondary | ICD-10-CM | POA: Diagnosis not present

## 2021-08-12 DIAGNOSIS — E7800 Pure hypercholesterolemia, unspecified: Secondary | ICD-10-CM

## 2021-08-12 DIAGNOSIS — Z923 Personal history of irradiation: Secondary | ICD-10-CM | POA: Insufficient documentation

## 2021-08-12 DIAGNOSIS — C770 Secondary and unspecified malignant neoplasm of lymph nodes of head, face and neck: Secondary | ICD-10-CM

## 2021-08-12 DIAGNOSIS — G959 Disease of spinal cord, unspecified: Secondary | ICD-10-CM

## 2021-08-12 LAB — CBC WITH DIFFERENTIAL/PLATELET
Abs Immature Granulocytes: 0.02 10*3/uL (ref 0.00–0.07)
Basophils Absolute: 0 10*3/uL (ref 0.0–0.1)
Basophils Relative: 1 %
Eosinophils Absolute: 0.1 10*3/uL (ref 0.0–0.5)
Eosinophils Relative: 2 %
HCT: 41.8 % (ref 39.0–52.0)
Hemoglobin: 14.3 g/dL (ref 13.0–17.0)
Immature Granulocytes: 0 %
Lymphocytes Relative: 21 %
Lymphs Abs: 1.2 10*3/uL (ref 0.7–4.0)
MCH: 30 pg (ref 26.0–34.0)
MCHC: 34.2 g/dL (ref 30.0–36.0)
MCV: 87.8 fL (ref 80.0–100.0)
Monocytes Absolute: 0.5 10*3/uL (ref 0.1–1.0)
Monocytes Relative: 9 %
Neutro Abs: 4 10*3/uL (ref 1.7–7.7)
Neutrophils Relative %: 67 %
Platelets: 162 10*3/uL (ref 150–400)
RBC: 4.76 MIL/uL (ref 4.22–5.81)
RDW: 12.2 % (ref 11.5–15.5)
WBC: 5.9 10*3/uL (ref 4.0–10.5)
nRBC: 0 % (ref 0.0–0.2)

## 2021-08-12 LAB — COMPREHENSIVE METABOLIC PANEL
ALT: 18 U/L (ref 0–44)
AST: 18 U/L (ref 15–41)
Albumin: 4.1 g/dL (ref 3.5–5.0)
Alkaline Phosphatase: 55 U/L (ref 38–126)
Anion gap: 6 (ref 5–15)
BUN: 11 mg/dL (ref 8–23)
CO2: 27 mmol/L (ref 22–32)
Calcium: 8.8 mg/dL — ABNORMAL LOW (ref 8.9–10.3)
Chloride: 107 mmol/L (ref 98–111)
Creatinine, Ser: 0.98 mg/dL (ref 0.61–1.24)
GFR, Estimated: >60 mL/min (ref >60)
Glucose, Bld: 126 mg/dL — ABNORMAL HIGH (ref 70–99)
Potassium: 3.8 mmol/L (ref 3.5–5.1)
Sodium: 140 mmol/L (ref 135–145)
Total Bilirubin: 0.7 mg/dL (ref 0.3–1.2)
Total Protein: 6.3 g/dL — ABNORMAL LOW (ref 6.5–8.1)

## 2021-08-12 LAB — TSH: TSH: 1.656 u[IU]/mL (ref 0.320–4.118)

## 2021-08-12 NOTE — Progress Notes (Signed)
[place]  [phone] Fax:([phone]) [phone]    ID: Steven Robinson   DOB: 30-Nov-1950  MR#: [id number]  KLK#:[id number]  Patient Care Team: Velna Hatchet, MD as PCP - General (Internal Medicine) Eppie Gibson, MD (Radiation Oncology) Carol Ada, MD as Attending Physician (Gastroenterology) Lorenda Cahill, DMD (Dentistry) Adrian Prows, MD as Consulting Physician (Cardiology) Izora Gala, MD as Consulting Physician (Otolaryngology) Henreitta Leber Marisue Brooklyn, MD as Referring Physician (Urology) SU: Georganna Skeans MD OTHER MD: Marcial Pacas, Jovita Gamma, Doreene Nest  CHIEF COMPLAINT: history of head and neck cancer  CURRENT TREATMENT: observation   INTERVAL HISTORY: Steven Robinson returns today for follow-up of his head and neck cancer. He continues on observation.  Since his last visit, he has been doing quite well.  He denies any new health complaints.  He has been following up with Dr. Constance Holster on an annual basis.  No worsening dizziness, dry mouth or swallowing difficulty.  No change in breathing or bowel habits or urinary habits.  He continues to work and stays very active.  He walks 10,000 steps a day, swims twice a week in the [facility].  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  He also has some chronic dry mouth and drinks a lot of water.  He is also dealing with some elevated PSA. Rest of the pertinent 10 point ROS reviewed and negative  Lab Results  Component Value Date   TSH 2.072 08/07/2020   TSH 0.490 08/08/2019   TSH 2.232 12/04/2018   TSH 2.472 09/04/2018   TSH 2.402 08/29/2017    COVID 19 VACCINATION STATUS: vaccinated x2 with booster October 2021   HISTORY OF HEAD AND NECK CANCER:: From the original intake note:   The patient noted an enlarging left neck mass early March 2013, and brought this to the attention of his PCP, Dr. Charleston Poot. She obtained a neck CT at [facility]  08/20/2011, which showed multiple enlarged nodes in the left mid-neck along the jugular v., the largest 2.0 cm. She referred the patient to Melissa Montane in ENT and FNA of one of the enlarged nodes was positive for squamous cell carcinoma ([id number]). A p16 stain was read as equivocal. PET scan 09/03/2011 showed SUVs >6 in the left oropharynx and left jugular chain nodes; SUV of 5.49 was noted in the larynx. There was no evidence of contralateral or disseminated disease.  This was consistent with a clinical T1 N2,stage IVA left oropharyngeal SCCA. However multiple biopsies under Dr. Janace Hoard 09/09/2011 [[id number]] were all negative. The pathologic staging therefore is TX N2, which impacts radiation planning.  The patient met with Drs. Haskell Riling, and IMRT with concurrent chemosensitization was planned. The patient had dental evaluation under Dr. Enrique Sack, several extractions under Dr. Benson Norway, had audiometry (showing mild high-frequency hearing loss) and met with nutrition. A PEG tube placed by IR unfortunately became dislodged and infected, and had to be replaced through an open procedure under Georganna Skeans. This delayed the start of chemotherapy and caused a change in plan from the original CDDP at 100 mg/m2 Q3w to 40 mg/M2 weekly. Radiation was started 10/04/2011 and chemotherapy 10/25/2011. The patient's subsequent history is as detailed below.    PAST MEDICAL HISTORY: Past Medical History:  Diagnosis Date   BPH (benign prostatic hyperplasia)    Dizziness and giddiness 01/04/2014   Fracture of fibula 7/14   left   Gastrostomy tube dependent [employer])    removed Oct. 2013  GERD (gastroesophageal reflux disease)    chronic   Hearing loss of left ear    Herniated lumbar intervertebral disc    L4-5   High cholesterol    History of EKG    done at PCP office, T. Spears02-17-12   Hx of colonic polyps    Hyperlipidemia    Hyperlipoproteinemia    Mucositis 11/01/2011   Neuromuscular disorder (HCC)      Radiation Myelopathy   Neuropathy    right upper extremity   Obstructive sleep apnea    on C-pap.    Oropharynx cancer ([place]) 08/26/2011   Pain    right arm and shoulder   Ptosis of left eyelid    Ptosis, left eyelid    Right inguinal hernia    S/P radiation therapy 10/04/11-11/22/11   Mucosal Axis/Neck:7000cGy/35Fractions   Status post chemotherapy    CDDP During with Radiation Therapy   Vertigo    2/2 intersitial neuritis.    Xerostomia     PAST SURGICAL HISTORY: Past Surgical History:  Procedure Laterality Date   DIAGNOSTIC LAPAROSCOPY     g-tube replacement    FINE NEEDLE ASPIRATION  08/26/11    LEFT NECK FNA, LYMPH NODE, METASTATIC SQUAMOUS CELL CARCINOMA   GASTROSTOMY  09/24/2011   Procedure: GASTROSTOMY;  Surgeon: Zenovia Jarred, MD;  Location: [place];  Service: General;  Laterality: N/A;  open G-tube placement procedure start _0    HERNIA REPAIR  1990   Left   [place]  04/24/2012   Procedure: HERNIA REPAIR INGUINAL ADULT;  Surgeon: Zenovia Jarred, MD;  Location: [place];  Service: General;  Laterality: Right;   INSERTION OF MESH  04/24/2012   Procedure: INSERTION OF MESH;  Surgeon: Zenovia Jarred, MD;  Location: [place];  Service: General;  Laterality: Right;   PEG TUBE REMOVAL  03/2012   Removal Bony Sequestrum Left 09/29/12   Left mandible / Area #17   RIGID ESOPHAGOSCOPY  09/24/2011   Procedure: RIGID ESOPHAGOSCOPY;  Surgeon: Melissa Montane, MD;  Location: [facility];  Service: ENT;  Laterality: N/A;  Esophagoscopy procedure end _1    VENTRAL HERNIA REPAIR  04/24/2012   Procedure: LAPAROSCOPIC VENTRAL HERNIA;  Surgeon: Zenovia Jarred, MD;  Location: [place];  Service: General;  Laterality: N/A;  laparoscopic repair incisional hernia with mesh    FAMILY HISTORY Family History  Problem Relation Age of Onset   Alzheimer's disease Mother    Tics Mother    Hypertension Father    Cancer Father 27       Prostate   Stroke Paternal Grandmother    Cancer  Paternal Grandfather        Lung  and Prostate   the patient's father is alive at age 81. He lives in a retirement community. The patient's mother died at the age of 80 in 2009/07/31; she had significant Alzheimer's disease. The patient is an only child.   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 His wife Shauna Hugh is a Artist in oil.  She is 17 years out from her breast cancer with no evidence of disease recurrence.  The live on 8 acres and have several horses, a donkey, a Clinical cytogeneticist, chickens, etc. The patient's daughter Steven Robinson lives in [place].. The patient's son Steven Robinson works for an Associate Professor. The patient has 2 grandchildren, granddaughter age 53 and a grandson aged 50 years old, Steven Robinson  attends the [employer].   ADVANCED DIRECTIVES: in place   HEALTH MAINTENANCE: Social History   Tobacco Use   Smoking status: Former    Packs/day: 0.25    Years: 3.00    Pack years: 0.75    Types: Cigarettes    Quit date: 06/14/1972    Years since quitting: 49.1   Smokeless tobacco: Never  Substance Use Topics   Alcohol use: No    Comment: Hx of Drinking wine - Stopped 3 years ago   Drug use: No     PSA: per urology   Allergies  Allergen Reactions   Flexeril [Cyclobenzaprine] Anxiety    Confusion    Current Outpatient Medications  Medication Sig Dispense Refill   aspirin EC 81 MG tablet Take 81 mg by mouth daily.     dexlansoprazole (DEXILANT) 60 MG capsule Take 60 mg by mouth at bedtime.      docusate sodium (COLACE) 100 MG capsule Take 100 mg by mouth daily.      finasteride (PROSCAR) 5 MG tablet Take 1 tablet (5 mg total) by mouth daily.     MYRBETRIQ 50 MG TB24 tablet Take 50 mg by mouth daily.      Probiotic Product (PROBIOTIC DAILY PO) Take by mouth.     rosuvastatin (CRESTOR) 20 MG tablet Take 20 mg by mouth daily.     sodium  fluoride (DENTAGEL) 1.1 % GEL dental gel      tamsulosin (FLOMAX) 0.4 MG CAPS capsule Take 0.4 mg by mouth daily.     zolpidem (AMBIEN) 10 MG tablet Take 10 mg by mouth at bedtime.      No current facility-administered medications for this visit.    OBJECTIVE: White man who appears well  Vitals:   08/12/21 1127  BP: 125/78  Pulse: 64  Resp: 16  Temp: 97.9 F (36.6 C)  SpO2: 100%     Body mass index is 30.12 kg/m.    ECOG FS: 1 Filed Weights   08/12/21 1127  Weight: 228 lb 4.8 oz (103.6 kg)    Physical Exam Constitutional:      Appearance: Normal appearance.  HENT:     Head: Normocephalic and atraumatic.  Neck:     Comments: Postradiation changes appreciated in the neck Cardiovascular:     Rate and Rhythm: Normal rate and regular rhythm.     Pulses: Normal pulses.     Heart sounds: Normal heart sounds.  Pulmonary:     Effort: Pulmonary effort is normal.     Breath sounds: Normal breath sounds.  Abdominal:     General: Abdomen is flat.     Palpations: Abdomen is soft.  Musculoskeletal:        General: No swelling or tenderness.     Cervical back: Normal range of motion and neck supple. No rigidity.  Lymphadenopathy:     Cervical: No cervical adenopathy.  Skin:    General: Skin is warm and dry.  Neurological:     General: No focal deficit present.     Mental Status: He is alert.    LAB RESULTS:  Lab Results  Component Value Date   WBC 5.9 08/12/2021   NEUTROABS 4.0 08/12/2021   HGB 14.3 08/12/2021   HCT 41.8 08/12/2021   MCV 87.8 08/12/2021   PLT 162 08/12/2021      Chemistry      Component Value Date/Time   NA 140 08/12/2021 1114   NA 141 09/02/2016 1336   K 3.8  08/12/2021 1114   K 4.3 09/02/2016 1336   CL 107 08/12/2021 1114   CL 104 10/03/2012 0803   CO2 27 08/12/2021 1114   CO2 29 09/02/2016 1336   BUN 11 08/12/2021 1114   BUN 9.5 09/02/2016 1336   CREATININE 0.98 08/12/2021 1114   CREATININE 0.91 12/04/2018 0952   CREATININE 0.9  09/02/2016 1336      Component Value Date/Time   CALCIUM 8.8 (L) 08/12/2021 1114   CALCIUM 9.4 09/02/2016 1336   ALKPHOS 55 08/12/2021 1114   ALKPHOS 66 09/02/2016 1336   AST 18 08/12/2021 1114   AST 18 12/04/2018 0952   AST 18 09/02/2016 1336   ALT 18 08/12/2021 1114   ALT 20 12/04/2018 0952   ALT 19 09/02/2016 1336   BILITOT 0.7 08/12/2021 1114   BILITOT 0.4 12/04/2018 0952   BILITOT 0.93 09/02/2016 1336      STUDIES: No results found.  ASSESSMENT:   71 y.o. 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 diagnosed by FNA of a left cervical node March 2013, treated with IMRT completed 11/22/2011 and concurrent weekly cis-platinum, last dose 11/23/2011  (1) risk factors are smoking history (minimal and remote), significant alcohol use (discontinued 2010), and an equivocal p16 probe  (2) dizziness, possibly due to autonomic dysfunction from cis-platinum  (3) malnutrition: PEG removed October 2013, stopped Boost supplements February 2014; maintaining weight orally  (4) dry mouth: discontinued pilocarpine and rinses; drinks extra water during meals  (5) at risk of hypothyroidism: TSH being followed   (6) right hand/arm/shoulder pain: 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; controlled on Lyrica, off narcotics as of March 2014  (7) right inguinal hernia/ventral hernia, repaired 04/24/2012  (8) bony sequestrum area #17, s/p debridement April 2014  (9) mild left ptosis, chronic  (10) status post trauma to the left lower extremity involving laceration and fracture of the fibula, July 2014, resolved  (11) NCCN suggested follow-up: In addition to TSH, needs dental evaluation twice a year, ENT evaluation yearly, continuing monitoring of nutrition and swallowing issues, with imaging as appropriate for signs or symptoms  (12) rising PSA, negative prostate biopsies 06/21/2016, followed closely by Dr. Felipa Eth   PLAN:   Patient is  doing really well since his last visit.  No concerns.  He does have some chronic dizziness which has remained stable.  Regards to dry mouth, unfortunately this is going to be a chronic adverse effect, he continues to drink extra water.  His TSH has been monitored annually, last TSH normal, TSH from today pending.  Since he is almost 10 years out from his cancer diagnosis, we will discharge from medical oncology at this time.  He can continue annual TSH monitoring with his PCP.  He can continue monitoring with Dr. Constance Holster annually. He follows up with Dr. Felipa Eth for his rising PSA. Continue dental exam twice a year.  He can return to clinic with [place] as needed. Thank you for consulting [place] in the care of this patient.  Please do not hesitate to contact us with any additional questions or concerns.  Total time spent: 30 minutes, he is a new patient to me is transitioning from Dr. Jana Hakim upon his retirement. *Total Encounter Time as defined by the Centers for Medicare and Medicaid Services includes, in addition to the face-to-face time of a patient visit (documented in the note above) non-face-to-face time: obtaining and reviewing outside history, ordering and reviewing medications, tests or procedures, care coordination (communications  with other health care professionals or caregivers) and documentation in the medical record.

## 2023-09-01 ENCOUNTER — Other Ambulatory Visit: Payer: Self-pay | Admitting: Gastroenterology

## 2023-09-09 ENCOUNTER — Encounter (HOSPITAL_COMMUNITY): Payer: Self-pay | Admitting: Gastroenterology

## 2023-09-09 NOTE — Progress Notes (Signed)
 Attempted to obtain medical history for pre op call via telephone, unable to reach at this time. HIPAA compliant voicemail message left requesting return call to pre surgical testing department.

## 2023-09-15 MED ORDER — HYDRALAZINE HCL 20 MG/ML IJ SOLN
INTRAMUSCULAR | Status: AC
  Filled 2023-09-15: qty 1

## 2023-09-15 NOTE — Anesthesia Preprocedure Evaluation (Signed)
 Anesthesia Evaluation  Patient identified by MRN, date of birth, ID band Patient awake    Reviewed: Allergy & Precautions, H&P , NPO status , Patient's Chart, lab work & pertinent test results  Airway Mallampati: II  TM Distance: >3 FB Neck ROM: Full    Dental  (+) Dental Advisory Given, Teeth Intact   Pulmonary sleep apnea , former smoker   Pulmonary exam normal breath sounds clear to auscultation       Cardiovascular Normal cardiovascular exam Rhythm:Regular Rate:Normal     Neuro/Psych  Neuromuscular disease negative neurological ROS     GI/Hepatic Neg liver ROS,GERD  ,,  Endo/Other  negative endocrine ROS    Renal/GU negative Renal ROS     Musculoskeletal negative musculoskeletal ROS (+)    Abdominal   Peds  Hematology negative hematology ROS (+)   Anesthesia Other Findings   Reproductive/Obstetrics                             Anesthesia Physical Anesthesia Plan  ASA: 2  Anesthesia Plan: MAC   Post-op Pain Management: Minimal or no pain anticipated   Induction: Intravenous  PONV Risk Score and Plan: 1 and TIVA, Propofol infusion and Treatment may vary due to age or medical condition  Airway Management Planned:   Additional Equipment:   Intra-op Plan:   Post-operative Plan:   Informed Consent: I have reviewed the patients History and Physical, chart, labs and discussed the procedure including the risks, benefits and alternatives for the proposed anesthesia with the patient or authorized representative who has indicated his/her understanding and acceptance.     Dental advisory given  Plan Discussed with: CRNA  Anesthesia Plan Comments:         Anesthesia Quick Evaluation

## 2023-09-16 ENCOUNTER — Other Ambulatory Visit: Payer: Self-pay

## 2023-09-16 ENCOUNTER — Ambulatory Visit (HOSPITAL_COMMUNITY): Payer: Self-pay | Admitting: Anesthesiology

## 2023-09-16 ENCOUNTER — Ambulatory Visit (HOSPITAL_COMMUNITY)
Admission: RE | Admit: 2023-09-16 | Discharge: 2023-09-16 | Disposition: A | Discharge status: Home / Self Care | Source: Ambulatory Visit | Attending: Gastroenterology | Admitting: Gastroenterology

## 2023-09-16 ENCOUNTER — Encounter (HOSPITAL_COMMUNITY)
Admission: RE | Disposition: A | Discharge status: Home / Self Care | Payer: Self-pay | Source: Ambulatory Visit | Attending: Gastroenterology

## 2023-09-16 DIAGNOSIS — D122 Benign neoplasm of ascending colon: Secondary | ICD-10-CM

## 2023-09-16 DIAGNOSIS — Z860100 Personal history of colon polyps, unspecified: Secondary | ICD-10-CM

## 2023-09-16 DIAGNOSIS — Z87891 Personal history of nicotine dependence: Secondary | ICD-10-CM | POA: Insufficient documentation

## 2023-09-16 DIAGNOSIS — Z923 Personal history of irradiation: Secondary | ICD-10-CM | POA: Diagnosis not present

## 2023-09-16 DIAGNOSIS — Z85818 Personal history of malignant neoplasm of other sites of lip, oral cavity, and pharynx: Secondary | ICD-10-CM | POA: Diagnosis not present

## 2023-09-16 DIAGNOSIS — D123 Benign neoplasm of transverse colon: Secondary | ICD-10-CM | POA: Insufficient documentation

## 2023-09-16 DIAGNOSIS — D120 Benign neoplasm of cecum: Secondary | ICD-10-CM | POA: Insufficient documentation

## 2023-09-16 DIAGNOSIS — K219 Gastro-esophageal reflux disease without esophagitis: Secondary | ICD-10-CM | POA: Insufficient documentation

## 2023-09-16 DIAGNOSIS — G4733 Obstructive sleep apnea (adult) (pediatric): Secondary | ICD-10-CM | POA: Insufficient documentation

## 2023-09-16 DIAGNOSIS — K635 Polyp of colon: Secondary | ICD-10-CM | POA: Insufficient documentation

## 2023-09-16 DIAGNOSIS — Z1211 Encounter for screening for malignant neoplasm of colon: Secondary | ICD-10-CM | POA: Insufficient documentation

## 2023-09-16 DIAGNOSIS — K5730 Diverticulosis of large intestine without perforation or abscess without bleeding: Secondary | ICD-10-CM | POA: Diagnosis not present

## 2023-09-16 DIAGNOSIS — Z9221 Personal history of antineoplastic chemotherapy: Secondary | ICD-10-CM | POA: Diagnosis not present

## 2023-09-16 HISTORY — PX: COLONOSCOPY: SHX5424

## 2023-09-16 HISTORY — PX: POLYPECTOMY: SHX149

## 2023-09-16 SURGERY — COLONOSCOPY
Anesthesia: Monitor Anesthesia Care

## 2023-09-16 MED ORDER — PROPOFOL 500 MG/50ML IV EMUL
INTRAVENOUS | Status: DC | PRN
  Administered 2023-09-16: 180 ug/kg/min via INTRAVENOUS

## 2023-09-16 MED ORDER — SODIUM CHLORIDE 0.9 % IV SOLN
INTRAVENOUS | Status: AC | PRN
  Administered 2023-09-16: 500 mL via INTRAMUSCULAR

## 2023-09-16 MED ORDER — PROPOFOL 10 MG/ML IV BOLUS
INTRAVENOUS | Status: DC | PRN
  Administered 2023-09-16: 20 mg via INTRAVENOUS

## 2023-09-16 MED ORDER — LIDOCAINE 2% (20 MG/ML) 5 ML SYRINGE
INTRAMUSCULAR | Status: DC | PRN
  Administered 2023-09-16: 40 mg via INTRAVENOUS

## 2023-09-16 NOTE — Transfer of Care (Signed)
 Immediate Anesthesia Transfer of Care Note  Patient: Steven Robinson  Procedure(s) Performed: COLONOSCOPY POLYPECTOMY, INTESTINE  Patient Location: Endoscopy Unit  Anesthesia Type:MAC  Level of Consciousness: sedated  Airway & Oxygen Therapy: Patient Spontanous Breathing and Patient connected to face mask oxygen  Post-op Assessment: Report given to RN and Post -op Vital signs reviewed and stable  Post vital signs: Reviewed and stable  Last Vitals:  Vitals Value Taken Time  BP    Temp    Pulse    Resp    SpO2      Last Pain:  Vitals:   09/16/23 0946  TempSrc: Temporal  PainSc: 0-No pain         Complications: No notable events documented.

## 2023-09-16 NOTE — Op Note (Signed)
 [facility] Patient Name: Steven Robinson Procedure Date: 09/16/2023 MRN: [id number] Attending MD: Jeani Hawking , MD, [id number] Date of Birth: April 02, 1951 CSN: [id number] Age: 73 Admit Type: Outpatient Procedure:                Colonoscopy Indications:              High risk colon cancer surveillance: Personal                            history of colonic polyps Providers:                Jeani Hawking, MD, Stephens Shire RN, RN, Kandice Robinsons, Technician Referring MD:              Medicines:                Propofol per Anesthesia Complications:            No immediate complications. Estimated Blood Loss:     Estimated blood loss: none. Procedure:                Pre-Anesthesia Assessment:                           - Prior to the procedure, a History and Physical                            was performed, and patient medications and                            allergies were reviewed. The patient's tolerance of                            previous anesthesia was also reviewed. The risks                            and benefits of the procedure and the sedation                            options and risks were discussed with the patient.                            All questions were answered, and informed consent                            was obtained. Prior Anticoagulants: The patient has                            taken no anticoagulant or antiplatelet agents. ASA                            Grade Assessment: III - A patient with severe  systemic disease. After reviewing the risks and                            benefits, the patient was deemed in satisfactory                            condition to undergo the procedure.                           - Sedation was administered by an anesthesia                            professional. Deep sedation was attained.                           After obtaining informed consent, the  colonoscope                            was passed under direct vision. Throughout the                            procedure, the patient's blood pressure, pulse, and                            oxygen saturations were monitored continuously. The                            CF-HQ190L ([id number]) Olympus colonoscope was                            introduced through the anus and advanced to the the                            cecum, identified by appendiceal orifice and                            ileocecal valve. The colonoscopy was performed                            without difficulty. The patient tolerated the                            procedure well. The quality of the bowel                            preparation was evaluated using the BBPS [facility]                            Bowel Preparation Scale) with scores of: Right                            Colon = 3, Transverse Colon = 3 and Left Colon = 3                            (  entire mucosa seen well with no residual staining,                            small fragments of stool or opaque liquid). The                            total BBPS score equals 9. The ileocecal valve,                            appendiceal orifice, and rectum were photographed. Scope In: 10:23:30 AM Scope Out: 10:39:24 AM Scope Withdrawal Time: 0 hours 9 minutes 53 seconds  Total Procedure Duration: 0 hours 15 minutes 54 seconds  Findings:      Four sessile polyps were found in the descending colon, transverse       colon, ascending colon and cecum. The polyps were 2 to 6 mm in size.       These polyps were removed with a cold snare. Resection and retrieval       were complete.      Scattered large-mouthed, medium-mouthed and small-mouthed diverticula       were found in the sigmoid colon. Impression:               - Four 2 to 6 mm polyps in the descending colon, in                            the transverse colon, in the ascending colon and in                             the cecum, removed with a cold snare. Resected and                            retrieved.                           - Diverticulosis in the sigmoid colon. Moderate Sedation:      Not Applicable - Patient had care per Anesthesia. Recommendation:           - Patient has a contact number available for                            emergencies. The signs and symptoms of potential                            delayed complications were discussed with the                            patient. Return to normal activities tomorrow.                            Written discharge instructions were provided to the                            patient.                           -  Resume regular diet.                           - Continue present medications.                           - Await pathology results.                           - Repeat colonoscopy in 5 years for surveillance. Procedure Code(s):        --- Professional ---                           [phone], Colonoscopy, flexible; with removal of                            tumor(s), polyp(s), or other lesion(s) by snare                            technique Diagnosis Code(s):        --- Professional ---                           Z86.010, Personal history of colonic polyps                           D12.4, Benign neoplasm of descending colon                           D12.3, Benign neoplasm of transverse colon (hepatic                            flexure or splenic flexure)                           D12.2, Benign neoplasm of ascending colon                           D12.0, Benign neoplasm of cecum                           K57.30, Diverticulosis of large intestine without                            perforation or abscess without bleeding CPT copyright 2022 American Medical Association. All rights reserved. The codes documented in this report are preliminary and upon coder review may  be revised to meet current compliance requirements. Jeani Hawking,  MD Jeani Hawking, MD 09/16/2023 10:47:32 AM This report has been signed electronically. Number of Addenda: 0

## 2023-09-16 NOTE — H&P (Signed)
 Steven Robinson HPI: The initiation of Creon helped with the patient's symptoms of bloating, but it was difficult for him to obtain the medication.  After using the samples he did not have any further bloating symptoms.  Also, he stated that he did not use up all the samples.  His colonoscopy on 02/03/2016 was normal, but in 2012 he exhibited 3 adenomas.  He denies any issues with chest pain, SOB, or MI  Past Medical History:  Diagnosis Date   BPH (benign prostatic hyperplasia)    Dizziness and giddiness 01/04/2014   Fracture of fibula 7/14   left   Gastrostomy tube dependent [facility])    removed Oct. 2013   GERD (gastroesophageal reflux disease)    chronic   Hearing loss of left ear    Herniated lumbar intervertebral disc    L4-5   High cholesterol    History of EKG    done at PCP office, T. Spears- 2012   Hx of colonic polyps    Hyperlipidemia    Hyperlipoproteinemia    Mucositis 11/01/2011   Neuromuscular disorder (HCC)     Radiation Myelopathy   Neuropathy    right upper extremity   Obstructive sleep apnea    on C-pap.    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    Xerostomia     Past Surgical History:  Procedure Laterality Date   DIAGNOSTIC LAPAROSCOPY     g-tube replacement    FINE NEEDLE ASPIRATION  08/26/11    LEFT NECK FNA, LYMPH NODE, METASTATIC SQUAMOUS CELL CARCINOMA   GASTROSTOMY  09/24/2011   Procedure: GASTROSTOMY;  Surgeon: Liz Malady, MD;  Location: [facility] OR;  Service: General;  Laterality: N/A;  open G-tube placement procedure start @1219    HERNIA REPAIR  1990   Left   INGUINAL HERNIA REPAIR  04/24/2012   Procedure: HERNIA REPAIR INGUINAL ADULT;  Surgeon: Liz Malady,  MD;  Location: [facility] OR;  Service: General;  Laterality: Right;   INSERTION OF MESH  04/24/2012   Procedure: INSERTION OF MESH;  Surgeon: Liz Malady, MD;  Location: [facility] OR;  Service: General;  Laterality: Right;   PEG TUBE REMOVAL  03/2012   Removal Bony Sequestrum Left 09/29/12   Left mandible / Area #17   RIGID ESOPHAGOSCOPY  09/24/2011   Procedure: RIGID ESOPHAGOSCOPY;  Surgeon: Suzanna Obey, MD;  Location: [facility] OR;  Service: ENT;  Laterality: N/A;  Esophagoscopy procedure end @1210    VENTRAL HERNIA REPAIR  04/24/2012   Procedure: LAPAROSCOPIC VENTRAL HERNIA;  Surgeon: Liz Malady, MD;  Location: [facility] OR;  Service: General;  Laterality: N/A;  laparoscopic repair incisional hernia with mesh    Family History  Problem Relation Age of Onset   Alzheimer's disease Mother    Tics Mother    Hypertension Father    Cancer Father 46       Prostate   Stroke Paternal Grandmother    Cancer Paternal Grandfather        Lung  and Prostate    Social History:  reports that he quit smoking about 51 years ago. He started smoking about 54 years ago. He has  a 0.8 pack-year smoking history. He has never used smokeless tobacco. He reports that he does not drink alcohol and does not use drugs.  Allergies:  Allergies  Allergen Reactions   Flexeril [Cyclobenzaprine] Anxiety    Confusion    Medications: Scheduled: Continuous:  sodium chloride 500 mL (09/16/23 0954)    No results found for this or any previous visit (from the past 24 hours).   No results found.  ROS:  As stated above in the HPI otherwise negative.  Blood pressure 110/69, pulse (!) 57, temperature 97.8 F (36.6 C), temperature source Temporal, resp. rate 16, height 6' 0.25" (1.835 m), weight 94.3 kg, SpO2 97%.    PE: Gen: NAD, Alert and Oriented HEENT:  [place]/AT, EOMI Neck: Supple, no LAD Lungs: CTA Bilaterally CV: RRR without M/G/R ABD: Soft, NTND, +BS Ext: No C/C/E  Assessment/Plan: 1) Personal history of polyps -  colonoscopy.  Steven Robinson D 09/16/2023, 10:05 AM

## 2023-09-16 NOTE — Anesthesia Postprocedure Evaluation (Signed)
 Anesthesia Post Note  Patient: Steven Robinson  Procedure(s) Performed: COLONOSCOPY POLYPECTOMY, INTESTINE     Patient location during evaluation: PACU Anesthesia Type: MAC Level of consciousness: awake and alert Pain management: pain level controlled Vital Signs Assessment: post-procedure vital signs reviewed and stable Respiratory status: spontaneous breathing Cardiovascular status: stable Anesthetic complications: no   No notable events documented.  Last Vitals:  Vitals:   09/16/23 1120 09/16/23 1125  BP:  106/75  Pulse: (!) 49 (!) 53  Resp: 16 16  Temp:    SpO2: 99% 100%    Last Pain:  Vitals:   09/16/23 1115  TempSrc:   PainSc: 0-No pain                 Lewie Loron

## 2023-09-16 NOTE — Discharge Instructions (Signed)

## 2023-09-18 ENCOUNTER — Encounter (HOSPITAL_COMMUNITY): Payer: Self-pay | Admitting: Gastroenterology

## 2023-09-19 LAB — SURGICAL PATHOLOGY

## 2024-01-26 ENCOUNTER — Other Ambulatory Visit (HOSPITAL_COMMUNITY): Payer: Self-pay | Admitting: Adult Health

## 2024-01-26 DIAGNOSIS — E785 Hyperlipidemia, unspecified: Secondary | ICD-10-CM

## 2024-01-26 DIAGNOSIS — R519 Headache, unspecified: Secondary | ICD-10-CM

## 2024-01-26 DIAGNOSIS — C109 Malignant neoplasm of oropharynx, unspecified: Secondary | ICD-10-CM

## 2024-01-26 DIAGNOSIS — R2689 Other abnormalities of gait and mobility: Secondary | ICD-10-CM

## 2024-01-26 DIAGNOSIS — R42 Dizziness and giddiness: Secondary | ICD-10-CM

## 2024-01-27 ENCOUNTER — Ambulatory Visit (HOSPITAL_BASED_OUTPATIENT_CLINIC_OR_DEPARTMENT_OTHER)
Admission: RE | Admit: 2024-01-27 | Discharge: 2024-01-27 | Disposition: A | Discharge status: Home / Self Care | Source: Ambulatory Visit | Attending: Vascular Surgery | Admitting: Vascular Surgery

## 2024-01-27 ENCOUNTER — Ambulatory Visit (HOSPITAL_COMMUNITY)
Admission: RE | Admit: 2024-01-27 | Discharge: 2024-01-27 | Disposition: A | Discharge status: Home / Self Care | Source: Ambulatory Visit | Attending: Adult Health | Admitting: Adult Health

## 2024-01-27 DIAGNOSIS — E785 Hyperlipidemia, unspecified: Secondary | ICD-10-CM

## 2024-01-27 DIAGNOSIS — R519 Headache, unspecified: Secondary | ICD-10-CM | POA: Diagnosis present

## 2024-01-27 DIAGNOSIS — C109 Malignant neoplasm of oropharynx, unspecified: Secondary | ICD-10-CM | POA: Insufficient documentation

## 2024-01-27 DIAGNOSIS — R42 Dizziness and giddiness: Secondary | ICD-10-CM | POA: Diagnosis present

## 2024-01-27 DIAGNOSIS — R2689 Other abnormalities of gait and mobility: Secondary | ICD-10-CM | POA: Insufficient documentation

## 2024-01-27 MED ORDER — GADOBUTROL 1 MMOL/ML IV SOLN
9.0000 mL | Freq: Once | INTRAVENOUS | Status: AC | PRN
  Administered 2024-01-27: 9 mL via INTRAVENOUS
